# Patient Record
Sex: Male | Born: 1970
Health system: Southern US, Community
[De-identification: ages and names within clinical notes are randomized; demographics above are authoritative.]

## PROBLEM LIST (undated history)

## (undated) DIAGNOSIS — K5792 Diverticulitis of intestine, part unspecified, without perforation or abscess without bleeding: Secondary | ICD-10-CM

## (undated) DIAGNOSIS — G809 Cerebral palsy, unspecified: Secondary | ICD-10-CM

## (undated) DIAGNOSIS — K5790 Diverticulosis of intestine, part unspecified, without perforation or abscess without bleeding: Secondary | ICD-10-CM

## (undated) DIAGNOSIS — R6250 Unspecified lack of expected normal physiological development in childhood: Secondary | ICD-10-CM

## (undated) DIAGNOSIS — G40209 Localization-related (focal) (partial) symptomatic epilepsy and epileptic syndromes with complex partial seizures, not intractable, without status epilepticus: Secondary | ICD-10-CM

## (undated) DIAGNOSIS — K651 Peritoneal abscess: Secondary | ICD-10-CM

## (undated) DIAGNOSIS — K5780 Diverticulitis of intestine, part unspecified, with perforation and abscess without bleeding: Secondary | ICD-10-CM

## (undated) HISTORY — DX: Unspecified lack of expected normal physiological development in childhood: R62.50

## (undated) HISTORY — DX: Diverticulitis of intestine, part unspecified, with perforation and abscess without bleeding: K57.80

## (undated) HISTORY — DX: Localization-related (focal) (partial) symptomatic epilepsy and epileptic syndromes with complex partial seizures, not intractable, without status epilepticus: G40.209

## (undated) HISTORY — DX: Cerebral palsy, unspecified: G80.9

## (undated) HISTORY — DX: Peritoneal abscess: K65.1

## (undated) HISTORY — DX: Diverticulosis of intestine, part unspecified, without perforation or abscess without bleeding: K57.90

---
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
## 2006-07-10 ENCOUNTER — Ambulatory Visit: Payer: Self-pay | Admitting: Family Medicine

## 2007-06-27 ENCOUNTER — Ambulatory Visit: Payer: Self-pay | Admitting: Family Medicine

## 2010-04-12 ENCOUNTER — Encounter (INDEPENDENT_AMBULATORY_CARE_PROVIDER_SITE_OTHER): Payer: Self-pay | Admitting: *Deleted

## 2010-09-20 ENCOUNTER — Ambulatory Visit
Admission: RE | Admit: 2010-09-20 | Discharge: 2010-09-20 | Payer: Self-pay | Source: Home / Self Care | Attending: Family Medicine | Admitting: Family Medicine

## 2010-10-04 NOTE — Letter (Signed)
Summary: Nadara Eaton letter  [facility]  [address] [place], [place] [address]   Phone: [phone]  Fax: [phone]       04/12/2010 MRN: [id number]  NEMIAH KISSNER [address] [place], [place]  [address]  Dear Mr. TAGEN, [facility], and [facility] announce the retirement of Arta Silence, M.D., from full-time practice at the [facility] office effective March 03, 2010 and his plans of returning part-time.  It is important to Dr. Hetty Ely and to our practice that you understand that [facility] has seven physicians in our office for your health care needs.  We will continue to offer the same exceptional care that you have today.    Dr. Hetty Ely has spoken to many of you about his plans for retirement and returning part-time in the fall.   We will continue to work with you through the transition to schedule appointments for you in the office and meet the high standards that [facility] is committed to.   Again, it is with great pleasure that we share the news that Dr. Hetty Ely will return to [facility] at [facility] in October of 2011 with a reduced schedule.    If you have any questions, or would like to request an appointment with one of our physicians, please call us at [phone] and press the option for Scheduling an appointment.  We take pleasure in providing you with excellent patient care and look forward to seeing you at your next office visit.  Our [facility] Physicians are:  Tillman Abide, M.D. Laurita Quint, M.D. Roxy Manns, M.D. Kerby Nora, M.D. Hannah Beat, M.D. Ruthe Mannan, M.D. We proudly welcomed Raechel Ache, M.D. and Eustaquio Boyden, M.D. to the practice in July/August 2011.  Sincerely,  [facility]

## 2010-10-06 NOTE — Assessment & Plan Note (Signed)
Summary: CHECK MOLE UNDER RIGHT ARM/CLE   Vital Signs:  Patient profile:   40 year old male Height:      71 inches Weight:      218.25 pounds BMI:     30.55 Temp:     71 degrees F oral Pulse rate:   72 / minute Pulse rhythm:   regular BP sitting:   114 / 78  (left arm) Cuff size:   large  Vitals Entered By: Sydell Axon LPN (September 20, 2010 3:46 PM) CC: Check mole under right arm   History of Present Illness: Edward Adkins here for a mole under his right arm that gets irritataed easily and then bothers him. He also has one on his anterior neck, one on the lateral lower left neck and one on the left mid chestwall. He otherwise feels well and has no complaints. He hasn't been here since his mother died fairly quickly from cancer. He seems to be doing well. 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 His only allergy is to Phenobarbital.  Problems Prior to Update: None  Medications Prior to Update: 1)  None  Current Medications (verified): 1)  Tegretol 200 Mg Tabs (Carbamazepine) .... Take One By Mouth Three Times A Day  Allergies (verified): 1)  ! Phenobarbital (Phenobarbital)  Physical Exam  General:  Well-developed,well-nourished,in no acute distress; alert,appropriate and cooperative throughout examination Head:  Normocephalic and atraumatic without obvious abnormalities.  Skin:  Irritated skin tag in the right axillary area laterally... skin tag also of the ant neck, left lat lower neck and left mid chest/abd wall. All areas prepped with betadine, local with lido and epi used. Hyfrecator was used at each site to excise. Bactroban and sterile drsg placed over each site. Tolerated well.   Impression & Recommendations:  Problem # 1:  NEOPLASM, SKIN, UNCERTAIN BEHAVIOR (ICD-238.2) Felt to be irritated skin tag that all were excised. Keep clean and dry for 24 hrs. RTC for irritation or redness.  Complete Medication List: 1)  Tegretol 200 Mg Tabs  (Carbamazepine) .... Take one by mouth three times a day  Patient Instructions: 1)  Pls schedule a Comp Exam, labs prior.     Current Allergies (reviewed today): ! PHENOBARBITAL (PHENOBARBITAL)

## 2010-10-15 ENCOUNTER — Encounter: Payer: Self-pay | Admitting: Family Medicine

## 2010-12-01 ENCOUNTER — Other Ambulatory Visit: Payer: Self-pay | Admitting: Family Medicine

## 2010-12-01 ENCOUNTER — Other Ambulatory Visit (INDEPENDENT_AMBULATORY_CARE_PROVIDER_SITE_OTHER): Payer: PRIVATE HEALTH INSURANCE | Admitting: Family Medicine

## 2010-12-01 DIAGNOSIS — Z0000 Encounter for general adult medical examination without abnormal findings: Secondary | ICD-10-CM

## 2010-12-01 LAB — LIPID PANEL
LDL Cholesterol: 134 mg/dL — ABNORMAL HIGH (ref 0–99)
Total CHOL/HDL Ratio: 5
Triglycerides: 120 mg/dL (ref 0.0–149.0)
VLDL: 24 mg/dL (ref 0.0–40.0)

## 2010-12-01 LAB — BASIC METABOLIC PANEL
BUN: 17 mg/dL (ref 6–23)
CO2: 30 mEq/L (ref 19–32)
Chloride: 107 mEq/L (ref 96–112)
Creatinine, Ser: 0.8 mg/dL (ref 0.4–1.5)
Potassium: 4.3 mEq/L (ref 3.5–5.1)

## 2010-12-01 LAB — HEPATIC FUNCTION PANEL
Albumin: 4.2 g/dL (ref 3.5–5.2)
Alkaline Phosphatase: 57 U/L (ref 39–117)
Bilirubin, Direct: 0.1 mg/dL (ref 0.0–0.3)

## 2010-12-07 ENCOUNTER — Encounter: Payer: Self-pay | Admitting: Family Medicine

## 2010-12-07 ENCOUNTER — Ambulatory Visit (INDEPENDENT_AMBULATORY_CARE_PROVIDER_SITE_OTHER): Payer: PRIVATE HEALTH INSURANCE | Admitting: Family Medicine

## 2010-12-07 DIAGNOSIS — G40209 Localization-related (focal) (partial) symptomatic epilepsy and epileptic syndromes with complex partial seizures, not intractable, without status epilepticus: Secondary | ICD-10-CM

## 2010-12-07 DIAGNOSIS — E785 Hyperlipidemia, unspecified: Secondary | ICD-10-CM | POA: Insufficient documentation

## 2010-12-07 NOTE — Progress Notes (Addendum)
  Subjective:    Patient ID: Edward Adkins, male    DOB: Feb 21, 1971, 40 y.o.   MRN: [id number]  HPI Pt here for Comp Exam. He feels well with no complaints. He still works at [employer] and is well liked.    Review of Systems  Constitutional: Negative for fever, chills, diaphoresis, appetite change, fatigue and unexpected weight change.  HENT: Negative for hearing loss, ear pain, tinnitus and ear discharge.   Eyes: Negative for pain, discharge and visual disturbance.  Respiratory: Negative for cough, shortness of breath and wheezing.   Cardiovascular: Negative for chest pain and palpitations.       No SOB w/ exertion  Gastrointestinal: Negative for nausea, vomiting, abdominal pain, diarrhea, constipation and blood in stool.       No heartburn or swallowing problems.  Genitourinary: Negative for dysuria, frequency and difficulty urinating.       No nocturia  Musculoskeletal: Negative for myalgias, back pain and arthralgias.  Skin: Negative for rash.       No itching or dryness.  Neurological: Negative for tremors and numbness.       No tingling or balance problems.  Hematological: Negative for adenopathy. Does not bruise/bleed easily.  Psychiatric/Behavioral: Negative for dysphoric mood and agitation.       Objective:   Physical Exam         Assessment & Plan:  Labs reviewed and are nml except LDL is slightly elevated and avoiding fatty foods and exercise will help. Needs Tegretol lvl in the future. Will check just after dosing for peak lvl. Noted 4/12..the patient still sees Neurology for followup of his Seizure Disorder and ha his Tegretol Lvl done there. No need for Lab here.

## 2010-12-07 NOTE — Assessment & Plan Note (Addendum)
New finding. LDL slightly high and can probably be corrected by mild weight loss and avoiding fatty foods. Encouraged to do exercise that will make him breathe hard and sweat. Will check again next year. Lab Results  Component Value Date   CHOL 194 12/01/2010   Lab Results  Component Value Date   HDL 36.40* 12/01/2010   Lab Results  Component Value Date   LDLCALC 134* 12/01/2010   Lab Results  Component Value Date   TRIG 120.0 12/01/2010   Lab Results  Component Value Date   CHOLHDL 5 12/01/2010  LDL calc 161

## 2010-12-07 NOTE — Assessment & Plan Note (Signed)
No recent seizures. Will need Tegretol lvl done in the future.

## 2010-12-15 NOTE — Progress Notes (Signed)
  Subjective:    Patient ID: Edward Adkins, male    DOB: 22-May-1971, 40 y.o.   MRN: [id number]  HPI       Review of Systems     Objective:   Physical Exam        Assessment & Plan:   Pt still sees Neurology and his Tegretol Lvl is followed there as well.

## 2012-08-27 ENCOUNTER — Encounter: Payer: Self-pay | Admitting: Family Medicine

## 2013-08-06 ENCOUNTER — Ambulatory Visit (INDEPENDENT_AMBULATORY_CARE_PROVIDER_SITE_OTHER): Payer: Medicare Other | Admitting: Nurse Practitioner

## 2013-08-06 ENCOUNTER — Encounter: Payer: Self-pay | Admitting: Nurse Practitioner

## 2013-08-06 ENCOUNTER — Encounter (INDEPENDENT_AMBULATORY_CARE_PROVIDER_SITE_OTHER): Payer: Self-pay

## 2013-08-06 VITALS — BP 128/83 | HR 74 | Ht 73.5 in | Wt 225.0 lb

## 2013-08-06 DIAGNOSIS — G40309 Generalized idiopathic epilepsy and epileptic syndromes, not intractable, without status epilepticus: Secondary | ICD-10-CM | POA: Diagnosis not present

## 2013-08-06 DIAGNOSIS — Z79899 Other long term (current) drug therapy: Secondary | ICD-10-CM

## 2013-08-06 MED ORDER — CARBAMAZEPINE 200 MG PO TABS: 200.0000 mg | ORAL_TABLET | Freq: Three times a day (TID) | ORAL | Status: DC

## 2013-08-06 NOTE — Progress Notes (Signed)
[facility]  PATIENT: Edward Adkins DOB: [birth year]   REASON FOR VISIT: Seizure disorder  HISTORY OF PRESENT ILLNESS: Edward Adkins, 42 year old male returns for followup. He has a history of cerebral palsy, mild mental retardation and seizure disorder. He has been followed at this office since 1984 last seizure event 1991. He is currently well-controlled on carbamazepine 200  (3) times daily without side effects. He needs labs today. He has not fallen, no missed doses of medication. No new neurologic complaints. He is accompanied by his father.   HISTORY:  of cerebral palsy, mild mental retardation and seizure disorder. He has been followed at [facility] since 1984,  onset of seizure disorder at age 89. 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 He was originally on phenobarbital and developed a rash, switched to Dilantin  with recurrent seizures. Initiated on carbamazepine in 1991without further seizure activity since that time. Denies any side effects to his medications, any daytime drowsiness, feelings of being off balance, no missed doses of his medication, and no falls.    REVIEW OF SYSTEMS: Full 14 system review of systems performed and notable only for:  Constitutional: Weight gain  Cardiovascular: N/A  Ear/Nose/Throat: N/A  Skin: N/A  Eyes: N/A  Respiratory: N/A  Gastroitestinal: N/A  Hematology/Lymphatic: N/A  Endocrine: N/A Musculoskeletal:N/A  Allergy/Immunology: N/A  Neurological: N/A Psychiatric: N/A   ALLERGIES: Allergies  Allergen Reactions  . Phenobarbital     REACTION: rash    HOME MEDICATIONS: Outpatient Prescriptions Prior to Visit  Medication Sig Dispense Refill  . carbamazepine (TEGRETOL) 200 MG tablet Take one by mouth three times a day      . Multiple Vitamin (MULTIVITAMIN) tablet Take 1 tablet by mouth daily.         No  facility-administered medications prior to visit.    PAST MEDICAL HISTORY: Past Medical History  Diagnosis Date  . Complex partial seizure disorder 05/78    Head CT's multiple-WNL, EEG R temporal lobe discharge (GNA)  . Cerebral palsy   . Mild mental retardation     PAST SURGICAL HISTORY: History reviewed. No pertinent past surgical history.  FAMILY HISTORY: Family History  Problem Relation Age of Onset  . Cancer Mother 45    Leukemia  . Cancer Father 46    Prostate ca  . CAD Father   . Diabetes Father     SOCIAL HISTORY: History   Social History  . Marital Status: Single    Spouse Name: N/A    Number of Children: 0  . Years of Education: 12   Occupational History  .     Social History Main Topics  . Smoking status: Never Smoker   . Smokeless tobacco: Never Used  . Alcohol Use: No  . Drug Use: No  . Sexual Activity: Not on file   Other Topics Concern  . Not on file   Social History Narrative   Patient is single and lives with his father.   Patient drinks two caffeine drinks daily.   Patient is ambi-dextrous.   Patient has a high school education.   Patient is a Merchandiser, retail at [employer].              PHYSICAL EXAM  Filed Vitals:   08/06/13 1040  BP: 128/83  Pulse: 74  Height: 6' 1.5" (1.867 m)  Weight: 225 lb (102.059 kg)  Body mass index is 29.28 kg/(m^2).  Generalized: Well developed, in no acute distress  Head: normocephalic and atraumatic,. Oropharynx benign  Neurological examination   Mentation: Alert oriented to time, place, history taking. Follows all commands, mild speech impairment but understandable.  Cranial nerve II-XII: Pupils were equal round reactive to light. Left exotropia.  extraocular movements otherwise full, visual field were full on confrontational test. Facial sensation and strength were normal. hearing was intact to finger rubbing bilaterally. Uvula tongue midline. head turning and shoulder shrug and were normal and  symmetric.Tongue protrusion into cheek strength was normal. Motor: normal bulk and tone, full strength in the BUE, BLE, . No focal weakness Coordination: finger-nose-finger, heel-to-shin bilaterally, no dysmetria Gait and Station: Rising up from seated position without assistance, normal stance, moderate stride, good arm swing, smooth turning, able to perform tiptoe, and heel walking without difficulty. Tandem gait steady  DIAGNOSTIC DATA (LABS, IMAGING, TESTING) - None to review   ASSESSMENT AND PLAN  42 y.o. year old male  has a past medical history of Complex partial seizure disorder, generalized seizure disorder; Cerebral palsy; and Mild mental retardation. here for followup. No seizures since 1991. Currently on carbamazepine 200 (3) times daily without side effects  Will continue Carbamazepine 200mg  TID will refill for 1 year Will check labs today, CBC, CMP and CBZ level today F/U yearly and prn  Nilda Riggs, [facility], [facility], APRN  [facility] [address] [place], [place] [address] [phone]

## 2013-08-06 NOTE — Patient Instructions (Signed)
Will continue Carbamazepine 200mg  TID will refill for 1 year Will check labs today, CBC, CMP and CBZ level today F/U yearly and prn

## 2013-08-07 LAB — CBC WITH DIFFERENTIAL/PLATELET
Basos: 0 %
Eos: 3 %
Eosinophils Absolute: 0.2 10*3/uL (ref 0.0–0.4)
Hemoglobin: 15.5 g/dL (ref 12.6–17.7)
Lymphs: 34 %
MCH: 30.8 pg (ref 26.6–33.0)
Monocytes: 9 %
Neutrophils Absolute: 3.6 10*3/uL (ref 1.4–7.0)
Neutrophils Relative %: 54 %
RBC: 5.03 x10E6/uL (ref 4.14–5.80)
WBC: 6.7 10*3/uL (ref 3.4–10.8)

## 2013-08-07 LAB — COMPREHENSIVE METABOLIC PANEL
ALT: 23 IU/L (ref 0–44)
AST: 20 IU/L (ref 0–40)
CO2: 24 mmol/L (ref 18–29)
Calcium: 9.6 mg/dL (ref 8.7–10.2)
Glucose: 78 mg/dL (ref 65–99)
Potassium: 4.4 mmol/L (ref 3.5–5.2)
Sodium: 137 mmol/L (ref 134–144)
Total Protein: 7.9 g/dL (ref 6.0–8.5)

## 2013-08-07 LAB — CARBAMAZEPINE LEVEL, TOTAL: Carbamazepine Lvl: 6.9 ug/mL (ref 4.0–12.0)

## 2013-08-07 NOTE — Progress Notes (Signed)
Quick Note:  Shared lab results per Ms Martin's findings with patient, he verbalized understanding ______

## 2014-02-26 DIAGNOSIS — Z0289 Encounter for other administrative examinations: Secondary | ICD-10-CM

## 2014-07-23 ENCOUNTER — Other Ambulatory Visit: Payer: Self-pay | Admitting: Nurse Practitioner

## 2014-08-03 DIAGNOSIS — Z23 Encounter for immunization: Secondary | ICD-10-CM | POA: Diagnosis not present

## 2014-08-06 ENCOUNTER — Encounter: Payer: Self-pay | Admitting: Nurse Practitioner

## 2014-08-06 ENCOUNTER — Ambulatory Visit (INDEPENDENT_AMBULATORY_CARE_PROVIDER_SITE_OTHER): Payer: Medicare Other | Admitting: Nurse Practitioner

## 2014-08-06 VITALS — BP 109/69 | HR 69 | Temp 98.6°F | Ht 73.0 in | Wt 224.0 lb

## 2014-08-06 DIAGNOSIS — Z5181 Encounter for therapeutic drug level monitoring: Secondary | ICD-10-CM | POA: Diagnosis not present

## 2014-08-06 DIAGNOSIS — G40309 Generalized idiopathic epilepsy and epileptic syndromes, not intractable, without status epilepticus: Secondary | ICD-10-CM

## 2014-08-06 DIAGNOSIS — G40209 Localization-related (focal) (partial) symptomatic epilepsy and epileptic syndromes with complex partial seizures, not intractable, without status epilepticus: Secondary | ICD-10-CM | POA: Diagnosis not present

## 2014-08-06 MED ORDER — CARBAMAZEPINE 200 MG PO TABS: 200.0000 mg | ORAL_TABLET | Freq: Three times a day (TID) | ORAL | Status: DC

## 2014-08-06 NOTE — Progress Notes (Signed)
[facility]  PATIENT: Edward Adkins DOB: 11/02/1970   REASON FOR VISIT: Follow-up for seizure disorder   HISTORY OF PRESENT ILLNESS:Mr. Edward Adkins, 43 year old male returns for followup. He has a history of cerebral palsy, mild mental retardation and seizure disorder. He has been followed at this office since 1984 last seizure event 1991. He is currently well-controlled on carbamazepine 200 (3) times daily without side effects. He needs labs today. He has not fallen, no missed doses of medication. No new neurologic complaints. He is unaccompanied. He returns for reevaluation. He needs medication refills   HISTORY: of cerebral palsy, mild mental retardation and seizure disorder. He has been followed at [facility] since 1984, onset of seizure disorder at age 43. 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 He was originally on phenobarbital and developed a rash, switched to Dilantin with recurrent seizures. Initiated on carbamazepine in 1991without further seizure activity since that time. Denies any side effects to his medications, any daytime drowsiness, feelings of being off balance, no missed doses of his medication, and no falls.   REVIEW OF SYSTEMS: Full 14 system review of systems performed and notable only for those listed, all others are neg:  Constitutional: N/A  Cardiovascular: N/A  Ear/Nose/Throat: N/A  Skin: N/A  Eyes: N/A  Respiratory: N/A  Gastroitestinal: N/A  Hematology/Lymphatic: N/A  Endocrine: N/A Musculoskeletal:N/A  Allergy/Immunology: N/A  Neurological: N/A Psychiatric: N/A Sleep : NA   ALLERGIES: Allergies  Allergen Reactions  . Phenobarbital     REACTION: rash    HOME MEDICATIONS: Outpatient Prescriptions Prior to Visit  Medication Sig Dispense Refill  . carbamazepine (TEGRETOL) 200 MG tablet take 1 tablet by mouth three times a day 90  tablet 0  . Multiple Vitamin (MULTIVITAMIN) tablet Take 1 tablet by mouth daily.       No facility-administered medications prior to visit.    PAST MEDICAL HISTORY: Past Medical History  Diagnosis Date  . Complex partial seizure disorder 05/78    Head CT's multiple-WNL, EEG R temporal lobe discharge (GNA)  . Cerebral palsy   . Mild mental retardation     PAST SURGICAL HISTORY: History reviewed. No pertinent past surgical history.  FAMILY HISTORY: Family History  Problem Relation Age of Onset  . Cancer Mother 14    Leukemia  . Cancer Father 67    Prostate ca  . CAD Father   . Diabetes Father     SOCIAL HISTORY: History   Social History  . Marital Status: Single    Spouse Name: N/A    Number of Children: 0  . Years of Education: 12   Occupational History  .     Social History Main Topics  . Smoking status: Never Smoker   . Smokeless tobacco: Never Used  . Alcohol Use: No  . Drug Use: No  . Sexual Activity: Not on file   Other Topics Concern  . Not on file   Social History Narrative   Patient is single and lives with his father.   Patient drinks two caffeine drinks daily.   Patient is ambi-dextrous.   Patient has a high school education.   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              PHYSICAL EXAM  Filed Vitals:   08/06/14 1332  BP: 109/69  Pulse: 69  Temp: 98.6 F (37 C)  TempSrc:  Oral  Height: 6\' 1"  (1.854 m)  Weight: 224 lb (101.606 kg)   Body mass index is 29.56 kg/(m^2). Generalized: Well developed, in no acute distress  Head: normocephalic and atraumatic,. Oropharynx benign  Neurological examination   Mentation: Alert oriented to time, place, history taking. Follows all commands, mild speech impairment but understandable.  Cranial nerve II-XII: Pupils were equal round reactive to light. Left exotropia. extraocular movements otherwise full, visual field were full on confrontational test. Facial sensation and strength were  normal. hearing was intact to finger rubbing bilaterally. Uvula tongue midline. head turning and shoulder shrug and were normal and symmetric.Tongue protrusion into cheek strength was normal. Motor: normal bulk and tone, full strength in the BUE, BLE, . No focal weakness Coordination: finger-nose-finger, heel-to-shin bilaterally, no dysmetria Gait and Station: Rising up from seated position without assistance, normal stance, moderate stride, good arm swing, smooth turning, able to perform tiptoe, and heel walking without difficulty. Tandem gait steady  (DIAGNOSTIC TESTING) - I reviewed patient records, labs, notes, testing and imaging  Lab Results  Component Value Date   WBC 6.7 08/06/2013   HGB 15.5 08/06/2013   HCT 45.0 08/06/2013   MCV 90 08/06/2013      Component Value Date/Time   NA 137 08/06/2013 1121   NA 141 12/01/2010 0839   K 4.4 08/06/2013 1121   CL 102 08/06/2013 1121   CO2 24 08/06/2013 1121   GLUCOSE 78 08/06/2013 1121   GLUCOSE 85 12/01/2010 0839   BUN 16 08/06/2013 1121   BUN 17 12/01/2010 0839   CREATININE 0.77 08/06/2013 1121   CALCIUM 9.6 08/06/2013 1121   PROT 7.9 08/06/2013 1121   PROT 7.5 12/01/2010 0839   ALBUMIN 4.2 12/01/2010 0839   AST 20 08/06/2013 1121   ALT 23 08/06/2013 1121   ALKPHOS 83 08/06/2013 1121   BILITOT 0.3 08/06/2013 1121   GFRNONAA 114 08/06/2013 1121   GFRAA 131 08/06/2013 1121     ASSESSMENT AND PLAN  43 y.o. year old male  has a past medical history of Complex partial seizure disorder (05/78); Cerebral palsy; and Mild mental retardation. here to follow-up. Last seizure 1991 well controlled on carbamazepine without side effects.   Continue carbamazepine at current dose refilled for the next year Check labs today, CBC, CMP Follow-up yearly and when necessary,  Dennie Bible, [facility], [facility], [facility] [address] [place], [place] [address] [phone]

## 2014-08-06 NOTE — Patient Instructions (Signed)
Continue carbamazepine at current dose refilled for the next year Check labs today Follow-up yearly and when necessary

## 2014-08-07 LAB — COMPREHENSIVE METABOLIC PANEL
ALK PHOS: 69 IU/L (ref 39–117)
ALT: 19 IU/L (ref 0–44)
AST: 21 IU/L (ref 0–40)
Albumin/Globulin Ratio: 1.5 (ref 1.1–2.5)
Albumin: 4.7 g/dL (ref 3.5–5.5)
BILIRUBIN TOTAL: 0.2 mg/dL (ref 0.0–1.2)
BUN / CREAT RATIO: 14 (ref 9–20)
BUN: 11 mg/dL (ref 6–24)
CHLORIDE: 101 mmol/L (ref 97–108)
CO2: 23 mmol/L (ref 18–29)
Calcium: 9.5 mg/dL (ref 8.7–10.2)
Creatinine, Ser: 0.8 mg/dL (ref 0.76–1.27)
GFR calc non Af Amer: 111 mL/min/{1.73_m2} (ref >59)
GFR, EST AFRICAN AMERICAN: 128 mL/min/{1.73_m2} (ref >59)
GLOBULIN, TOTAL: 3.2 g/dL (ref 1.5–4.5)
Glucose: 88 mg/dL (ref 65–99)
Potassium: 4.6 mmol/L (ref 3.5–5.2)
Sodium: 143 mmol/L (ref 134–144)
Total Protein: 7.9 g/dL (ref 6.0–8.5)

## 2014-08-07 LAB — CBC WITH DIFFERENTIAL/PLATELET
BASOS ABS: 0.1 10*3/uL (ref 0.0–0.2)
Basos: 1 %
Eos: 4 %
Eosinophils Absolute: 0.3 10*3/uL (ref 0.0–0.4)
HCT: 43.6 % (ref 37.5–51.0)
Hemoglobin: 14.9 g/dL (ref 12.6–17.7)
IMMATURE GRANULOCYTES: 0 %
Immature Grans (Abs): 0 10*3/uL (ref 0.0–0.1)
LYMPHS ABS: 2.9 10*3/uL (ref 0.7–3.1)
Lymphs: 35 %
MCH: 30.8 pg (ref 26.6–33.0)
MCHC: 34.2 g/dL (ref 31.5–35.7)
MCV: 90 fL (ref 79–97)
MONOS ABS: 0.6 10*3/uL (ref 0.1–0.9)
Monocytes: 7 %
Neutrophils Absolute: 4.3 10*3/uL (ref 1.4–7.0)
Neutrophils Relative %: 53 %
RBC: 4.83 x10E6/uL (ref 4.14–5.80)
RDW: 13.2 % (ref 12.3–15.4)
WBC: 8.2 10*3/uL (ref 3.4–10.8)

## 2014-08-10 NOTE — Progress Notes (Signed)
I agree above plan. 

## 2014-08-23 ENCOUNTER — Other Ambulatory Visit: Payer: Self-pay | Admitting: Diagnostic Neuroimaging

## 2014-09-04 HISTORY — PX: COLONOSCOPY: SHX174

## 2014-10-05 DIAGNOSIS — K5790 Diverticulosis of intestine, part unspecified, without perforation or abscess without bleeding: Secondary | ICD-10-CM | POA: Insufficient documentation

## 2014-10-05 DIAGNOSIS — K5792 Diverticulitis of intestine, part unspecified, without perforation or abscess without bleeding: Secondary | ICD-10-CM

## 2014-10-05 HISTORY — DX: Diverticulosis of intestine, part unspecified, without perforation or abscess without bleeding: K57.90

## 2014-10-05 HISTORY — DX: Diverticulitis of intestine, part unspecified, without perforation or abscess without bleeding: K57.92

## 2014-10-12 ENCOUNTER — Ambulatory Visit (INDEPENDENT_AMBULATORY_CARE_PROVIDER_SITE_OTHER): Payer: Medicare Other | Admitting: Family Medicine

## 2014-10-12 ENCOUNTER — Other Ambulatory Visit: Payer: Self-pay | Admitting: Family Medicine

## 2014-10-12 ENCOUNTER — Encounter: Payer: Self-pay | Admitting: Family Medicine

## 2014-10-12 VITALS — BP 120/84 | HR 112 | Temp 99.6°F | Wt 222.0 lb

## 2014-10-12 DIAGNOSIS — R1030 Lower abdominal pain, unspecified: Secondary | ICD-10-CM

## 2014-10-12 DIAGNOSIS — G40209 Localization-related (focal) (partial) symptomatic epilepsy and epileptic syndromes with complex partial seizures, not intractable, without status epilepticus: Secondary | ICD-10-CM | POA: Diagnosis not present

## 2014-10-12 LAB — COMPREHENSIVE METABOLIC PANEL
ALT: 17 U/L (ref 0–53)
AST: 12 U/L (ref 0–37)
Albumin: 4.4 g/dL (ref 3.5–5.2)
Alkaline Phosphatase: 56 U/L (ref 39–117)
BUN: 17 mg/dL (ref 6–23)
CO2: 25 meq/L (ref 19–32)
CREATININE: 0.99 mg/dL (ref 0.40–1.50)
Calcium: 9.4 mg/dL (ref 8.4–10.5)
Chloride: 98 mEq/L (ref 96–112)
GFR: 88.12 mL/min (ref >60.00)
GLUCOSE: 97 mg/dL (ref 70–99)
Potassium: 3.9 mEq/L (ref 3.5–5.1)
SODIUM: 134 meq/L — AB (ref 135–145)
TOTAL PROTEIN: 8.7 g/dL — AB (ref 6.0–8.3)
Total Bilirubin: 0.8 mg/dL (ref 0.2–1.2)

## 2014-10-12 LAB — POCT URINALYSIS DIPSTICK
Blood, UA: NEGATIVE
Glucose, UA: NEGATIVE
Leukocytes, UA: NEGATIVE
Nitrite, UA: NEGATIVE
PH UA: 6
UROBILINOGEN UA: 0.2

## 2014-10-12 LAB — CBC WITH DIFFERENTIAL/PLATELET
BASOS PCT: 0.6 % (ref 0.0–3.0)
Basophils Absolute: 0.1 10*3/uL (ref 0.0–0.1)
Eosinophils Absolute: 0 10*3/uL (ref 0.0–0.7)
Eosinophils Relative: 0.1 % (ref 0.0–5.0)
HCT: 45.3 % (ref 39.0–52.0)
HEMOGLOBIN: 15.4 g/dL (ref 13.0–17.0)
LYMPHS ABS: 1.5 10*3/uL (ref 0.7–4.0)
Lymphocytes Relative: 8.5 % — ABNORMAL LOW (ref 12.0–46.0)
MCHC: 33.9 g/dL (ref 30.0–36.0)
MCV: 90.3 fl (ref 78.0–100.0)
MONO ABS: 1.2 10*3/uL — AB (ref 0.1–1.0)
MONOS PCT: 7 % (ref 3.0–12.0)
NEUTROS ABS: 14.8 10*3/uL — AB (ref 1.4–7.7)
Neutrophils Relative %: 83.8 % — ABNORMAL HIGH (ref 43.0–77.0)
PLATELETS: 213 10*3/uL (ref 150.0–400.0)
RBC: 5.01 Mil/uL (ref 4.22–5.81)
RDW: 12.9 % (ref 11.5–15.5)
WBC: 17.7 10*3/uL — AB (ref 4.0–10.5)

## 2014-10-12 LAB — LIPASE: Lipase: 5 U/L — ABNORMAL LOW (ref 11.0–59.0)

## 2014-10-12 MED ORDER — CIPROFLOXACIN HCL 500 MG PO TABS: 500.0000 mg | ORAL_TABLET | Freq: Two times a day (BID) | ORAL | Status: DC

## 2014-10-12 NOTE — Progress Notes (Addendum)
BP 120/84 mmHg  Pulse 112  Temp(Src) 99.6 F (37.6 C) (Oral)  Wt 222 lb (100.699 kg)   CC: abd pain  Subjective:    Patient ID: Edward Adkins, male    DOB: June 20, 1971, 44 y.o.   MRN: [id number]  HPI: Edward Adkins is a 44 y.o. male presenting on 10/12/2014 for Abdominal Pain   Presents with dad.   Not seen since 12/2010, at that time saw Dr Council Mechanic for CPE. He has known hyperlipidemia, mild mental retardation with cerebral palsy, and complex partial seizures well controlled on tegretol daily. He is single and lives with his father and worse at [employer].  He presents today with 3d h/o sharp stabbing pain lower abdomen. Last painful this morning. Appetite down. + nausea, endorses dysuria. + feverish and sweaty. At its worst, 10/10 pain, currently 0/10 pain. Has tried tylenol and maalox which was helpful. Also took father's tramadol which was also helpful. Last stool was this morning, normal consistency.  Has not checked for fever at home (lost thermometer). No diarrhea or constipation. No hematuria or blood in stool. No urgency of frequency. No indigestion or bloating. Bottom doesn't hurt. No new foods or restaurants, no recent travel.   Ginger ale and grape juice seems to alleviate pain. No aggravating factors.   This is similar to prior episode of abd pain that happened 2 months ago and resolved on its own.   Relevant past medical, surgical, family and social history reviewed and updated as indicated. Interim medical history since our last visit reviewed. Allergies and medications reviewed and updated. Current Outpatient Prescriptions on File Prior to Visit  Medication Sig  . carbamazepine (TEGRETOL) 200 MG tablet Take 1 tablet (200 mg total) by mouth 3 (three) times daily.  . Multiple Vitamin (MULTIVITAMIN) tablet Take 1 tablet by mouth daily.     No current facility-administered medications on file prior to visit.   Past Medical History  Diagnosis Date  . Complex partial  seizure disorder 05/78    Head CT's multiple-WNL, EEG R temporal lobe discharge (GNA)  . Cerebral palsy   . Mild mental retardation    History reviewed. No pertinent past surgical history.   Review of Systems Per HPI unless specifically indicated above     Objective:    BP 120/84 mmHg  Pulse 112  Temp(Src) 99.6 F (37.6 C) (Oral)  Wt 222 lb (100.699 kg)  Wt Readings from Last 3 Encounters:  10/12/14 222 lb (100.699 kg)  08/06/14 224 lb (101.606 kg)  08/06/13 225 lb (102.059 kg)    Physical Exam  Constitutional: He appears well-developed and well-nourished. No distress.  HENT:  Mouth/Throat: Oropharynx is clear and moist. No oropharyngeal exudate.  Eyes: Conjunctivae and EOM are normal. Pupils are equal, round, and reactive to light.  Cardiovascular: Regular rhythm, normal heart sounds and intact distal pulses.  Tachycardia present.   No murmur heard. Pulmonary/Chest: Effort normal and breath sounds normal. No respiratory distress. He has no wheezes. He has no rales.  Abdominal: Soft. Normal appearance and bowel sounds are normal. He exhibits no distension and no mass. There is no hepatosplenomegaly. There is tenderness in the periumbilical area and suprapubic area. There is no rigidity, no rebound, no guarding, no CVA tenderness and negative Murphy's sign. No hernia.  Musculoskeletal: He exhibits no edema.  Skin: Skin is warm and dry. No rash noted.  Psychiatric: He has a normal mood and affect.  Nursing note and vitals reviewed.  Results for  orders placed or performed in visit on 10/12/14  POCT Urinalysis Dipstick  Result Value Ref Range   Color, UA Amber    Clarity, UA Clear    Glucose, UA Negative    Bilirubin, UA 1+    Ketones, UA Trace    Spec Grav, UA >=1.030    Blood, UA Negative    pH, UA 6.0    Protein, UA 1+    Urobilinogen, UA 0.2    Nitrite, UA Negative    Leukocytes, UA Negative       Assessment & Plan:   Problem List Items Addressed This Visit     Lower abdominal pain - Primary    Tachycardic today but nontoxic, denies current abd pain. Unclear etiology - UA not consistent with infection or kidney stone. Not consistent with diverticulitis (no BM changes), appendicitis (lower abd pain). ?prostatitis.  Will check labs today (CMP, CBC, lipase) as well as update carbamazepine level as not checked this past year.  Discussed if worsening pain recommend ER evaluation. If not improving consider abdominal imaging for further evaluation.      Relevant Orders   POCT Urinalysis Dipstick (Completed)   Comprehensive metabolic panel   CBC with Differential/Platelet   Lipase   Complex partial seizures   Relevant Orders   Carbamazepine level, total       Follow up plan: Return in about 3 months (around 01/10/2015), or if symptoms worsen or fail to improve.

## 2014-10-12 NOTE — Assessment & Plan Note (Addendum)
Tachycardic today but nontoxic, denies current abd pain. Unclear etiology - UA not consistent with infection or kidney stone. Not consistent with diverticulitis (no BM changes), appendicitis (lower abd pain). ?prostatitis.  Will check labs today (CMP, CBC, lipase) as well as update carbamazepine level as not checked this past year.  Discussed if worsening pain recommend ER evaluation. If not improving consider abdominal imaging for further evaluation.

## 2014-10-12 NOTE — Patient Instructions (Addendum)
Urine looked concentrated - try to increase water intake or small sips of fluids throughout the day.  Blood work today - we will call you with results.  Let us know if worsening pain in the meantime.

## 2014-10-12 NOTE — Progress Notes (Signed)
Pre visit review using our clinic review tool, if applicable. No additional management support is needed unless otherwise documented below in the visit note. 

## 2014-10-13 ENCOUNTER — Encounter (HOSPITAL_COMMUNITY): Payer: Self-pay | Admitting: *Deleted

## 2014-10-13 ENCOUNTER — Ambulatory Visit (INDEPENDENT_AMBULATORY_CARE_PROVIDER_SITE_OTHER)
Admission: RE | Admit: 2014-10-13 | Discharge: 2014-10-13 | Disposition: A | Discharge status: Home / Self Care | Payer: Medicare Other | Source: Ambulatory Visit | Attending: Family Medicine | Admitting: Family Medicine

## 2014-10-13 ENCOUNTER — Inpatient Hospital Stay (HOSPITAL_COMMUNITY)
Admission: EM | Admit: 2014-10-13 | Discharge: 2014-10-22 | DRG: 392 | Disposition: A | Discharge status: Home / Self Care | Payer: Medicare Other | Attending: Internal Medicine | Admitting: Internal Medicine

## 2014-10-13 DIAGNOSIS — G809 Cerebral palsy, unspecified: Secondary | ICD-10-CM | POA: Diagnosis present

## 2014-10-13 DIAGNOSIS — K631 Perforation of intestine (nontraumatic): Secondary | ICD-10-CM | POA: Diagnosis not present

## 2014-10-13 DIAGNOSIS — G40209 Localization-related (focal) (partial) symptomatic epilepsy and epileptic syndromes with complex partial seizures, not intractable, without status epilepticus: Secondary | ICD-10-CM | POA: Diagnosis not present

## 2014-10-13 DIAGNOSIS — F70 Mild intellectual disabilities: Secondary | ICD-10-CM | POA: Diagnosis not present

## 2014-10-13 DIAGNOSIS — K5780 Diverticulitis of intestine, part unspecified, with perforation and abscess without bleeding: Secondary | ICD-10-CM | POA: Diagnosis not present

## 2014-10-13 DIAGNOSIS — L0291 Cutaneous abscess, unspecified: Secondary | ICD-10-CM

## 2014-10-13 DIAGNOSIS — Z888 Allergy status to other drugs, medicaments and biological substances status: Secondary | ICD-10-CM

## 2014-10-13 DIAGNOSIS — K5732 Diverticulitis of large intestine without perforation or abscess without bleeding: Secondary | ICD-10-CM

## 2014-10-13 DIAGNOSIS — R109 Unspecified abdominal pain: Secondary | ICD-10-CM | POA: Diagnosis not present

## 2014-10-13 DIAGNOSIS — K630 Abscess of intestine: Secondary | ICD-10-CM | POA: Diagnosis not present

## 2014-10-13 DIAGNOSIS — E785 Hyperlipidemia, unspecified: Secondary | ICD-10-CM | POA: Diagnosis not present

## 2014-10-13 DIAGNOSIS — K651 Peritoneal abscess: Secondary | ICD-10-CM

## 2014-10-13 DIAGNOSIS — K5720 Diverticulitis of large intestine with perforation and abscess without bleeding: Secondary | ICD-10-CM | POA: Diagnosis not present

## 2014-10-13 DIAGNOSIS — R1030 Lower abdominal pain, unspecified: Secondary | ICD-10-CM | POA: Diagnosis not present

## 2014-10-13 HISTORY — DX: Diverticulitis of intestine, part unspecified, without perforation or abscess without bleeding: K57.92

## 2014-10-13 LAB — URINALYSIS, ROUTINE W REFLEX MICROSCOPIC
Glucose, UA: NEGATIVE mg/dL
Ketones, ur: NEGATIVE mg/dL
LEUKOCYTES UA: NEGATIVE
Nitrite: NEGATIVE
PH: 5 (ref 5.0–8.0)
Protein, ur: 100 mg/dL — AB
Specific Gravity, Urine: 1.015 (ref 1.005–1.030)
Urobilinogen, UA: 0.2 mg/dL (ref 0.0–1.0)

## 2014-10-13 LAB — CBC WITH DIFFERENTIAL/PLATELET
BASOS PCT: 0 % (ref 0–1)
Basophils Absolute: 0 10*3/uL (ref 0.0–0.1)
EOS ABS: 0.1 10*3/uL (ref 0.0–0.7)
Eosinophils Relative: 1 % (ref 0–5)
HCT: 41 % (ref 39.0–52.0)
Hemoglobin: 14.1 g/dL (ref 13.0–17.0)
LYMPHS ABS: 1.7 10*3/uL (ref 0.7–4.0)
Lymphocytes Relative: 15 % (ref 12–46)
MCH: 30.7 pg (ref 26.0–34.0)
MCHC: 34.4 g/dL (ref 30.0–36.0)
MCV: 89.1 fL (ref 78.0–100.0)
MONO ABS: 0.9 10*3/uL (ref 0.1–1.0)
MONOS PCT: 8 % (ref 3–12)
Neutro Abs: 8.3 10*3/uL — ABNORMAL HIGH (ref 1.7–7.7)
Neutrophils Relative %: 76 % (ref 43–77)
Platelets: 224 10*3/uL (ref 150–400)
RBC: 4.6 MIL/uL (ref 4.22–5.81)
RDW: 12.7 % (ref 11.5–15.5)
WBC: 10.9 10*3/uL — ABNORMAL HIGH (ref 4.0–10.5)

## 2014-10-13 LAB — COMPREHENSIVE METABOLIC PANEL
ALBUMIN: 3.7 g/dL (ref 3.5–5.2)
ALT: 28 U/L (ref 0–53)
AST: 33 U/L (ref 0–37)
Alkaline Phosphatase: 61 U/L (ref 39–117)
Anion gap: 11 (ref 5–15)
BUN: 18 mg/dL (ref 6–23)
CALCIUM: 8.6 mg/dL (ref 8.4–10.5)
CHLORIDE: 99 mmol/L (ref 96–112)
CO2: 24 mmol/L (ref 19–32)
CREATININE: 1.04 mg/dL (ref 0.50–1.35)
GFR calc Af Amer: >90 mL/min (ref >90)
GFR calc non Af Amer: 88 mL/min — ABNORMAL LOW (ref >90)
Glucose, Bld: 112 mg/dL — ABNORMAL HIGH (ref 70–99)
Potassium: 4 mmol/L (ref 3.5–5.1)
SODIUM: 134 mmol/L — AB (ref 135–145)
Total Bilirubin: 0.8 mg/dL (ref 0.3–1.2)
Total Protein: 8.2 g/dL (ref 6.0–8.3)

## 2014-10-13 LAB — CARBAMAZEPINE LEVEL, TOTAL: CARBAMAZEPINE LVL: 8.6 ug/mL (ref 4.0–12.0)

## 2014-10-13 LAB — URINE MICROSCOPIC-ADD ON

## 2014-10-13 LAB — LIPASE, BLOOD: LIPASE: 24 U/L (ref 11–59)

## 2014-10-13 MED ORDER — SODIUM CHLORIDE 0.9 % IV SOLN
Freq: Once | INTRAVENOUS | Status: AC
  Administered 2014-10-13: 125 mL/h via INTRAVENOUS

## 2014-10-13 MED ORDER — IOHEXOL 300 MG/ML  SOLN
100.0000 mL | Freq: Once | INTRAMUSCULAR | Status: AC | PRN
  Administered 2014-10-13: 100 mL via INTRAVENOUS

## 2014-10-13 MED ORDER — PIPERACILLIN-TAZOBACTAM 3.375 G IVPB 30 MIN
3.3750 g | Freq: Once | INTRAVENOUS | Status: AC
  Administered 2014-10-13: 3.375 g via INTRAVENOUS
  Filled 2014-10-13: qty 50

## 2014-10-13 MED ORDER — SODIUM CHLORIDE 0.9 % IV BOLUS (SEPSIS)
500.0000 mL | Freq: Once | INTRAVENOUS | Status: AC
  Administered 2014-10-13: 500 mL via INTRAVENOUS

## 2014-10-13 NOTE — H&P (Addendum)
[facility] History and Physical  Patient: Edward Adkins  MRN: [id number]  DOB: May 30, 1971  DOS: the patient was seen and examined on 10/13/2014 PCP: [name], MD  Chief Complaint: Abdominal pain  HPI: Edward Adkins is a 44 y.o. male with Past medical history of cerebral palsy with complex partial seizure. The patient is presenting with complaints of abdominal pain the pain is located in the lower abdomen feels like a sharp crampy pain. The pain has been ongoing over the weekend for last 3 days. They went to see his PCP on Monday and there he was started on ciprofloxacin and a CT scan was also ordered. Since CT scan was abnormal the patient was sent here for further workup. Patient's father mentions that patient had a similar episode 2 months ago at which time the symptoms resolved on its own. Patient does not have any nausea or vomiting does not have any diarrhea does not have any active bleeding does not have any fever. No recent changes in patient's medications reported. No further prior episodes reported. No abdominal surgery reported. Surgery was initially consulted who recommended conservative management and at medicine admission.  The patient is coming from home. And at his baseline independent for most of his ADL.  Review of Systems: as mentioned in the history of present illness.  A Comprehensive review of the other systems is negative.  Past Medical History  Diagnosis Date  . Complex partial seizure disorder 05/78    Head CT's multiple-WNL, EEG R temporal lobe discharge (GNA)  . Cerebral palsy   . Mild mental retardation    History reviewed. No pertinent past surgical history. Social History:  reports that he has never smoked. He has never used smokeless tobacco. He reports that he does not drink alcohol or use illicit drugs.  Allergies  Allergen Reactions  . Phenobarbital     REACTION: rash    Family History  Problem Relation Age of Onset  . Cancer  Mother 37    Leukemia  . Cancer Father 73    Prostate ca  . CAD Father   . Diabetes Father     Prior to Admission medications   Medication Sig Start Date End Date Taking? Authorizing Provider  carbamazepine (TEGRETOL) 200 MG tablet Take 1 tablet (200 mg total) by mouth 3 (three) times daily. 08/06/14  Yes Dennie Bible, NP  ciprofloxacin (CIPRO) 500 MG tablet Take 1 tablet (500 mg total) by mouth 2 (two) times daily. 10/12/14  Yes [name], MD  Multiple Vitamin (MULTIVITAMIN) tablet Take 2 tablets by mouth daily.    Yes Historical Provider, MD    Physical Exam: Filed Vitals:   10/13/14 2230 10/13/14 2245 10/13/14 2300 10/13/14 2315  BP: 109/71 111/79 110/67 112/71  Pulse: 98 96 96 86  Temp:      TempSrc:      Resp: 18 16 16 16   Height:      Weight:      SpO2: 100% 100% 100% 100%    General: Alert, Awake and Oriented to Time, Place and Person. Appear in mild distress Eyes: PERRL ENT: Oral Mucosa clear moist. Neck: no JVD Cardiovascular: S1 and S2 Present, no Murmur, Peripheral Pulses Present Respiratory: Bilateral Air entry equal and Decreased, Clear to Auscultation, noCrackles, no wheezes Abdomen: Bowel Sound present, Soft and minimally tender lower abdomen Skin: no Rash Extremities: no Pedal edema, no calf tenderness Neurologic: Grossly no focal neuro deficit.  Labs on Admission:  CBC:  Recent  Labs Lab 10/12/14 1200 10/13/14 1917  WBC 17.7* 10.9*  NEUTROABS 14.8* 8.3*  HGB 15.4 14.1  HCT 45.3 41.0  MCV 90.3 89.1  PLT 213.0 224    CMP     Component Value Date/Time   NA 134* 10/13/2014 1917   NA 143 08/06/2014 1426   K 4.0 10/13/2014 1917   CL 99 10/13/2014 1917   CO2 24 10/13/2014 1917   GLUCOSE 112* 10/13/2014 1917   GLUCOSE 88 08/06/2014 1426   BUN 18 10/13/2014 1917   BUN 11 08/06/2014 1426   CREATININE 1.04 10/13/2014 1917   CALCIUM 8.6 10/13/2014 1917   PROT 8.2 10/13/2014 1917   PROT 7.9 08/06/2014 1426   ALBUMIN 3.7 10/13/2014  1917   AST 33 10/13/2014 1917   ALT 28 10/13/2014 1917   ALKPHOS 61 10/13/2014 1917   BILITOT 0.8 10/13/2014 1917   GFRNONAA 88* 10/13/2014 1917   GFRAA >90 10/13/2014 1917     Recent Labs Lab 10/12/14 1200 10/13/14 1917  LIPASE 5.0* 24    No results for input(s): CKTOTAL, CKMB, CKMBINDEX, TROPONINI in the last 168 hours. BNP (last 3 results) No results for input(s): BNP in the last 8760 hours.  ProBNP (last 3 results) No results for input(s): PROBNP in the last 8760 hours.   Radiological Exams on Admission: Ct Abdomen Pelvis W Contrast  10/13/2014   CLINICAL DATA:  Low abdominal pain and fever.  EXAM: CT ABDOMEN AND PELVIS WITH CONTRAST  TECHNIQUE: Multidetector CT imaging of the abdomen and pelvis was performed using the standard protocol following bolus administration of intravenous contrast.  CONTRAST:  164mL OMNIPAQUE IOHEXOL 300 MG/ML  SOLN  COMPARISON:  None.  FINDINGS: BODY WALL: Unremarkable.  LOWER CHEST: Unremarkable.  ABDOMEN/PELVIS:  Liver: Sub cm low-density liver likely reflects cysts.  Biliary: No evidence of biliary obstruction or stone.  Pancreas: Unremarkable.  Spleen: Unremarkable.  Adrenals: Unremarkable.  Kidneys and ureters: No hydronephrosis or stone.  Bladder: Unremarkable.  Reproductive: Unremarkable.  Bowel: Bowel perforation in the low mid abdomen with 4 cm gas and fluid containing abscess that also contains oral contrast. A tract of oral contrast extends towards the sigmoid colon, favoring a diverticular perforation. The appendix base is contiguous with the abscess and is overdistended and avidly enhancing. While the appendix is likely obstructed, the inflammatory changes around the tip and mid portion are not as extensive as expected for primary appendicitis. There is terminal ileitis with circumferential submucosal edema, likely secondary. Bowel encompasses most of the abscess circumference, making percutaneous drainage difficult.  Retroperitoneum: No mass or  adenopathy.  Peritoneum: No ascites or pneumoperitoneum.  Vascular: No acute abnormality.  OSSEOUS: No acute abnormalities.  These results were called by telephone at the time of interpretation on 10/13/2014 at 4:00 pm to Dr. [name] , who verbally acknowledged these results.  IMPRESSION: Contained bowel perforation in the low abdomen with 4 cm abscess. A diverticular source is favored over ruptured appendix, as reasoned above.   Electronically Signed   By: Monte Fantasia M.D.   On: 10/13/2014 16:04    Assessment/Plan Principal Problem:   Diverticulitis of intestine with perforation Active Problems:   Complex partial seizures   1. Diverticulitis of intestine with perforation The patient is presenting with numbness of abdominal pain. He does not have any significant leukocytosis nor he has any fever and appears splenomegaly stable. Does not have any significant tenderness on exam. A CT scan of the abdomen was performed which is showing 4 cm  abscess with free air likely suspicious for sigmoid diverticulitis. General surgery recommends medicine admission with conservative management. Patient was started on Zosyn in the ER. At present I would continue with Zosyn currently. IV hydration. Patient will remain nothing by mouth except medication. IV Zofran as needed IV morphine as needed for management. Further workup depending on surgical recommendation and evaluation.  2. Contact partial seizure. No recent seizures reported. Continue with the home dose of Tegretol.  Advance goals of care discussion: Full code   Consults: Gen. surgery, Dr. Barry Dienes, they will follow-up with patient  DVT Prophylaxis: subcutaneous Heparin Nutrition: Nothing by mouth  Family Communication: Father was present at bedside, opportunity was given to ask question and all questions were answered satisfactorily at the time of interview. Disposition: Admitted to inpatient in med-surge unit.  Author: Berle Mull, MD [facility] Pager: [phone] 10/13/2014, 11:52 PM    If 7PM-7AM, please contact night-coverage [email or web address] Password [email or web address]

## 2014-10-13 NOTE — ED Notes (Signed)
The pt has had abd pain since last Friday no n v or doctor and had a c-t abd and something about infection inn his abd

## 2014-10-13 NOTE — ED Provider Notes (Signed)
CSN: [id number]     Arrival date & time 10/13/14  5427 History   First MD Initiated Contact with Patient 10/13/14 2135     Chief Complaint  Patient presents with  . Abdominal Pain     (Consider location/radiation/quality/duration/timing/severity/associated sxs/prior Treatment) HPI Edward Adkins is a 44 y.o. male with hx of seizures, MR, CP, presents to ED with complaint of abdominal pain. Patient states his pain began 3 days ago. 2 days ago he went to his primary care doctor who ordered a CT scan outpatient. He had a CT scan today and was called and told to come to the hospital because he had an infection. Patient reports similar pain about 2 months ago. He states he did not tell anybody in the [facility] after several days. Patient reports subjective fever and chills, did not take temperature at home. He denies any nausea or vomiting. He is unsure if there has been change in his bowels. He was started on Cipro by his primary care doctor which she has taken since Monday and states his pain is actually improving some. Patient denies any other complaints. States movement and palpation of his abdomen makes symptoms worse. Nothing makes it better. No hx of prior abdominal surgeries.   Past Medical History  Diagnosis Date  . Complex partial seizure disorder 05/78    Head CT's multiple-WNL, EEG R temporal lobe discharge (GNA)  . Cerebral palsy   . Mild mental retardation    History reviewed. No pertinent past surgical history. Family History  Problem Relation Age of Onset  . Cancer Mother 40    Leukemia  . Cancer Father 37    Prostate ca  . CAD Father   . Diabetes Father    History  Substance Use Topics  . Smoking status: Never Smoker   . Smokeless tobacco: Never Used  . Alcohol Use: No    Review of Systems  Constitutional: Negative for fever and chills.  Respiratory: Negative for cough, chest tightness and shortness of breath.   Cardiovascular: Negative for chest pain, palpitations and  leg swelling.  Gastrointestinal: Positive for nausea and abdominal pain. Negative for vomiting, diarrhea and abdominal distention.  Genitourinary: Negative for dysuria, urgency, frequency and hematuria.  Musculoskeletal: Negative for myalgias, arthralgias, neck pain and neck stiffness.  Skin: Negative for rash.  Allergic/Immunologic: Negative for immunocompromised state.  Neurological: Negative for dizziness, weakness, light-headedness, numbness and headaches.      Allergies  Phenobarbital  Home Medications   Prior to Admission medications   Medication Sig Start Date End Date Taking? Authorizing Provider  carbamazepine (TEGRETOL) 200 MG tablet Take 1 tablet (200 mg total) by mouth 3 (three) times daily. 08/06/14   Dennie Bible, NP  ciprofloxacin (CIPRO) 500 MG tablet Take 1 tablet (500 mg total) by mouth 2 (two) times daily. 10/12/14   [name], MD  Multiple Vitamin (MULTIVITAMIN) tablet Take 1 tablet by mouth daily.      Historical Provider, MD   BP 114/73 mmHg  Pulse 97  Temp(Src) 98.1 F (36.7 C)  Resp 20  Ht 6' (1.829 m)  Wt 222 lb (100.699 kg)  BMI 30.10 kg/m2  SpO2 98% Physical Exam  Constitutional: He appears well-developed and well-nourished. No distress.  HENT:  Head: Normocephalic and atraumatic.  Eyes: Conjunctivae are normal.  Neck: Neck supple.  Cardiovascular: Normal rate, regular rhythm and normal heart sounds.   Pulmonary/Chest: Effort normal. No respiratory distress. He has no wheezes. He has no rales.  Abdominal:  Soft. Bowel sounds are normal. He exhibits no distension. There is tenderness. There is no rebound.  RLQ, lower abdominal tenderness  Musculoskeletal: He exhibits no edema.  Neurological: He is alert.  Skin: Skin is warm and dry.  Nursing note and vitals reviewed.   ED Course  Procedures (including critical care time) Labs Review Labs Reviewed  CBC WITH DIFFERENTIAL/PLATELET - Abnormal; Notable for the following:    WBC 10.9  (*)    Neutro Abs 8.3 (*)    All other components within normal limits  COMPREHENSIVE METABOLIC PANEL - Abnormal; Notable for the following:    Sodium 134 (*)    Glucose, Bld 112 (*)    GFR calc non Af Amer 88 (*)    All other components within normal limits  URINALYSIS, ROUTINE W REFLEX MICROSCOPIC - Abnormal; Notable for the following:    APPearance CLOUDY (*)    Hgb urine dipstick MODERATE (*)    Bilirubin Urine SMALL (*)    Protein, ur 100 (*)    All other components within normal limits  URINE MICROSCOPIC-ADD ON - Abnormal; Notable for the following:    Squamous Epithelial / LPF FEW (*)    Bacteria, UA FEW (*)    All other components within normal limits  LIPASE, BLOOD    Imaging Review Ct Abdomen Pelvis W Contrast  10/13/2014   CLINICAL DATA:  Low abdominal pain and fever.  EXAM: CT ABDOMEN AND PELVIS WITH CONTRAST  TECHNIQUE: Multidetector CT imaging of the abdomen and pelvis was performed using the standard protocol following bolus administration of intravenous contrast.  CONTRAST:  13mL OMNIPAQUE IOHEXOL 300 MG/ML  SOLN  COMPARISON:  None.  FINDINGS: BODY WALL: Unremarkable.  LOWER CHEST: Unremarkable.  ABDOMEN/PELVIS:  Liver: Sub cm low-density liver likely reflects cysts.  Biliary: No evidence of biliary obstruction or stone.  Pancreas: Unremarkable.  Spleen: Unremarkable.  Adrenals: Unremarkable.  Kidneys and ureters: No hydronephrosis or stone.  Bladder: Unremarkable.  Reproductive: Unremarkable.  Bowel: Bowel perforation in the low mid abdomen with 4 cm gas and fluid containing abscess that also contains oral contrast. A tract of oral contrast extends towards the sigmoid colon, favoring a diverticular perforation. The appendix base is contiguous with the abscess and is overdistended and avidly enhancing. While the appendix is likely obstructed, the inflammatory changes around the tip and mid portion are not as extensive as expected for primary appendicitis. There is terminal  ileitis with circumferential submucosal edema, likely secondary. Bowel encompasses most of the abscess circumference, making percutaneous drainage difficult.  Retroperitoneum: No mass or adenopathy.  Peritoneum: No ascites or pneumoperitoneum.  Vascular: No acute abnormality.  OSSEOUS: No acute abnormalities.  These results were called by telephone at the time of interpretation on 10/13/2014 at 4:00 pm to Dr. [name] , who verbally acknowledged these results.  IMPRESSION: Contained bowel perforation in the low abdomen with 4 cm abscess. A diverticular source is favored over ruptured appendix, as reasoned above.   Electronically Signed   By: Monte Fantasia M.D.   On: 10/13/2014 16:04     EKG Interpretation None      MDM   Final diagnoses:  Intra-abdominal abscess  Perforation bowel    Patient in emergency department with abdominal pain, had an outpatient CT scan done which showed a contained perforation of bowels with a 4 cm abscess. Most likely from diverticular source. I discussed this with general surgeon Dr. Barry Dienes, as for tried to admit.  Spoke with [facility] Posey Pronto [facility],  he will come see patient.  Patient neurovascularly intact, appears to be comfortable, offered pain medications, he refused. IV fluids started and Zosyn ordered.  Filed Vitals:   10/13/14 2230 10/13/14 2245 10/13/14 2300 10/13/14 2315  BP: 109/71 111/79 110/67 112/71  Pulse: 98 96 96 86  Temp:      TempSrc:      Resp: 18 16 16 16   Height:      Weight:      SpO2: 100% 100% 100% 100%       Renold Genta, PA-C 10/14/14 [place], MD 10/14/14 1450

## 2014-10-14 ENCOUNTER — Encounter (HOSPITAL_COMMUNITY): Payer: Self-pay | Admitting: General Practice

## 2014-10-14 DIAGNOSIS — K5720 Diverticulitis of large intestine with perforation and abscess without bleeding: Secondary | ICD-10-CM | POA: Diagnosis not present

## 2014-10-14 DIAGNOSIS — K5780 Diverticulitis of intestine, part unspecified, with perforation and abscess without bleeding: Secondary | ICD-10-CM | POA: Diagnosis present

## 2014-10-14 DIAGNOSIS — Z888 Allergy status to other drugs, medicaments and biological substances status: Secondary | ICD-10-CM | POA: Diagnosis not present

## 2014-10-14 DIAGNOSIS — G809 Cerebral palsy, unspecified: Secondary | ICD-10-CM | POA: Diagnosis present

## 2014-10-14 DIAGNOSIS — E785 Hyperlipidemia, unspecified: Secondary | ICD-10-CM | POA: Diagnosis present

## 2014-10-14 DIAGNOSIS — R109 Unspecified abdominal pain: Secondary | ICD-10-CM | POA: Diagnosis not present

## 2014-10-14 DIAGNOSIS — G40209 Localization-related (focal) (partial) symptomatic epilepsy and epileptic syndromes with complex partial seizures, not intractable, without status epilepticus: Secondary | ICD-10-CM | POA: Diagnosis present

## 2014-10-14 DIAGNOSIS — K651 Peritoneal abscess: Secondary | ICD-10-CM | POA: Diagnosis not present

## 2014-10-14 DIAGNOSIS — L0291 Cutaneous abscess, unspecified: Secondary | ICD-10-CM | POA: Diagnosis not present

## 2014-10-14 DIAGNOSIS — G40219 Localization-related (focal) (partial) symptomatic epilepsy and epileptic syndromes with complex partial seizures, intractable, without status epilepticus: Secondary | ICD-10-CM | POA: Diagnosis not present

## 2014-10-14 DIAGNOSIS — K630 Abscess of intestine: Secondary | ICD-10-CM | POA: Diagnosis not present

## 2014-10-14 DIAGNOSIS — K5700 Diverticulitis of small intestine with perforation and abscess without bleeding: Secondary | ICD-10-CM | POA: Diagnosis not present

## 2014-10-14 DIAGNOSIS — L03311 Cellulitis of abdominal wall: Secondary | ICD-10-CM | POA: Diagnosis not present

## 2014-10-14 DIAGNOSIS — F70 Mild intellectual disabilities: Secondary | ICD-10-CM | POA: Diagnosis present

## 2014-10-14 HISTORY — DX: Diverticulitis of intestine, part unspecified, with perforation and abscess without bleeding: K57.80

## 2014-10-14 LAB — PROTIME-INR
INR: 1.2 (ref 0.00–1.49)
PROTHROMBIN TIME: 15.4 s — AB (ref 11.6–15.2)

## 2014-10-14 LAB — CBC
HCT: 36.7 % — ABNORMAL LOW (ref 39.0–52.0)
Hemoglobin: 12.5 g/dL — ABNORMAL LOW (ref 13.0–17.0)
MCH: 30.6 pg (ref 26.0–34.0)
MCHC: 34.1 g/dL (ref 30.0–36.0)
MCV: 89.7 fL (ref 78.0–100.0)
PLATELETS: 196 10*3/uL (ref 150–400)
RBC: 4.09 MIL/uL — ABNORMAL LOW (ref 4.22–5.81)
RDW: 12.6 % (ref 11.5–15.5)
WBC: 8 10*3/uL (ref 4.0–10.5)

## 2014-10-14 LAB — COMPREHENSIVE METABOLIC PANEL
ALBUMIN: 3.1 g/dL — AB (ref 3.5–5.2)
ALT: 31 U/L (ref 0–53)
AST: 29 U/L (ref 0–37)
Alkaline Phosphatase: 52 U/L (ref 39–117)
Anion gap: 11 (ref 5–15)
BILIRUBIN TOTAL: 0.7 mg/dL (ref 0.3–1.2)
BUN: 15 mg/dL (ref 6–23)
CALCIUM: 8.1 mg/dL — AB (ref 8.4–10.5)
CHLORIDE: 104 mmol/L (ref 96–112)
CO2: 22 mmol/L (ref 19–32)
CREATININE: 0.97 mg/dL (ref 0.50–1.35)
GFR calc Af Amer: >90 mL/min (ref >90)
Glucose, Bld: 105 mg/dL — ABNORMAL HIGH (ref 70–99)
Potassium: 3.4 mmol/L — ABNORMAL LOW (ref 3.5–5.1)
SODIUM: 137 mmol/L (ref 135–145)
Total Protein: 7 g/dL (ref 6.0–8.3)

## 2014-10-14 MED ORDER — MORPHINE SULFATE 2 MG/ML IJ SOLN
2.0000 mg | INTRAMUSCULAR | Status: DC | PRN
  Administered 2014-10-19 (×2): 2 mg via INTRAVENOUS
  Filled 2014-10-14 (×2): qty 1

## 2014-10-14 MED ORDER — FAMOTIDINE IN NACL 20-0.9 MG/50ML-% IV SOLN
INTRAVENOUS | Status: AC
  Administered 2014-10-14
  Filled 2014-10-14: qty 50

## 2014-10-14 MED ORDER — PIPERACILLIN-TAZOBACTAM 3.375 G IVPB
3.3750 g | Freq: Three times a day (TID) | INTRAVENOUS | Status: DC
  Administered 2014-10-14 – 2014-10-22 (×26): 3.375 g via INTRAVENOUS
  Filled 2014-10-14 (×30): qty 50

## 2014-10-14 MED ORDER — HEPARIN SODIUM (PORCINE) 5000 UNIT/ML IJ SOLN
INTRAMUSCULAR | Status: AC
  Administered 2014-10-14
  Filled 2014-10-14: qty 1

## 2014-10-14 MED ORDER — HEPARIN SODIUM (PORCINE) 5000 UNIT/ML IJ SOLN
5000.0000 [IU] | Freq: Three times a day (TID) | INTRAMUSCULAR | Status: DC
  Administered 2014-10-14 – 2014-10-19 (×15): 5000 [IU] via SUBCUTANEOUS
  Filled 2014-10-14 (×18): qty 1

## 2014-10-14 MED ORDER — CARBAMAZEPINE 200 MG PO TABS
200.0000 mg | ORAL_TABLET | Freq: Three times a day (TID) | ORAL | Status: DC
  Administered 2014-10-14 – 2014-10-22 (×25): 200 mg via ORAL
  Filled 2014-10-14 (×28): qty 1

## 2014-10-14 MED ORDER — SODIUM CHLORIDE 0.9 % IV SOLN
INTRAVENOUS | Status: DC
  Administered 2014-10-14 – 2014-10-16 (×2): via INTRAVENOUS

## 2014-10-14 MED ORDER — ONDANSETRON HCL 4 MG/2ML IJ SOLN
4.0000 mg | Freq: Four times a day (QID) | INTRAMUSCULAR | Status: DC | PRN
  Administered 2014-10-19: 4 mg via INTRAVENOUS
  Filled 2014-10-14: qty 2

## 2014-10-14 MED ORDER — FAMOTIDINE IN NACL 20-0.9 MG/50ML-% IV SOLN
20.0000 mg | INTRAVENOUS | Status: DC
Start: 2014-10-14 — End: 2014-10-20
  Administered 2014-10-15 – 2014-10-20 (×6): 20 mg via INTRAVENOUS
  Filled 2014-10-14 (×7): qty 50

## 2014-10-14 MED ORDER — ONDANSETRON HCL 4 MG PO TABS: 4.0000 mg | ORAL_TABLET | Freq: Four times a day (QID) | ORAL | Status: DC | PRN

## 2014-10-14 NOTE — Consult Note (Signed)
Reason for Consult:diverticular abscess Referring Provider:  Richmond Campbell, PA-C  Edward Adkins is an 44 y.o. male.  HPI:  Pt is a 44 yo M who has been having crampy abdominal pain on and off since Saturday (4 days pta). He received cipro from his PCP and a scan was ordered. He has not taken his temperature, but he has had some fever/chills.  No n/v.  No diarrhea/constipation.    He had a similar episode a few months ago, but it resolved spontaneously.  It was not as severe.    Past Medical History  Diagnosis Date  . Complex partial seizure disorder 05/78    Head CT's multiple-WNL, EEG R temporal lobe discharge (GNA)  . Cerebral palsy   . Mild mental retardation     History reviewed. No pertinent past surgical history.  Family History  Problem Relation Age of Onset  . Cancer Mother 41    Leukemia  . Cancer Father 59    Prostate ca  . CAD Father   . Diabetes Father     Social History:  reports that he has never smoked. He has never used smokeless tobacco. He reports that he does not drink alcohol or use illicit drugs.  Allergies:  Allergies  Allergen Reactions  . Phenobarbital     REACTION: rash    Medications: Prior to Admission: Tegretol, cipro, mvt  Results for orders placed or performed during the hospital encounter of 10/13/14 (from the past 48 hour(s))  CBC with Differential     Status: Abnormal   Collection Time: 10/13/14  7:17 PM  Result Value Ref Range   WBC 10.9 (H) 4.0 - 10.5 K/uL   RBC 4.60 4.22 - 5.81 MIL/uL   Hemoglobin 14.1 13.0 - 17.0 g/dL   HCT 41.0 39.0 - 52.0 %   MCV 89.1 78.0 - 100.0 fL   MCH 30.7 26.0 - 34.0 pg   MCHC 34.4 30.0 - 36.0 g/dL   RDW 12.7 11.5 - 15.5 %   Platelets 224 150 - 400 K/uL   Neutrophils Relative % 76 43 - 77 %   Neutro Abs 8.3 (H) 1.7 - 7.7 K/uL   Lymphocytes Relative 15 12 - 46 %   Lymphs Abs 1.7 0.7 - 4.0 K/uL   Monocytes Relative 8 3 - 12 %   Monocytes Absolute 0.9 0.1 - 1.0 K/uL   Eosinophils Relative 1 0 -  5 %   Eosinophils Absolute 0.1 0.0 - 0.7 K/uL   Basophils Relative 0 0 - 1 %   Basophils Absolute 0.0 0.0 - 0.1 K/uL  Comprehensive metabolic panel     Status: Abnormal   Collection Time: 10/13/14  7:17 PM  Result Value Ref Range   Sodium 134 (L) 135 - 145 mmol/L   Potassium 4.0 3.5 - 5.1 mmol/L   Chloride 99 96 - 112 mmol/L   CO2 24 19 - 32 mmol/L   Glucose, Bld 112 (H) 70 - 99 mg/dL   BUN 18 6 - 23 mg/dL   Creatinine, Ser 1.04 0.50 - 1.35 mg/dL   Calcium 8.6 8.4 - 10.5 mg/dL   Total Protein 8.2 6.0 - 8.3 g/dL   Albumin 3.7 3.5 - 5.2 g/dL   AST 33 0 - 37 U/L   ALT 28 0 - 53 U/L   Alkaline Phosphatase 61 39 - 117 U/L   Total Bilirubin 0.8 0.3 - 1.2 mg/dL   GFR calc non Af Amer 88 (L) >90 mL/min   GFR  calc Af Amer >90 >90 mL/min    Comment: (NOTE) The eGFR has been calculated using the CKD EPI equation. This calculation has not been validated in all clinical situations. eGFR's persistently <90 mL/min signify possible Chronic Kidney Disease.    Anion gap 11 5 - 15  Lipase, blood     Status: None   Collection Time: 10/13/14  7:17 PM  Result Value Ref Range   Lipase 24 11 - 59 U/L  Urinalysis, Routine w reflex microscopic     Status: Abnormal   Collection Time: 10/13/14  7:26 PM  Result Value Ref Range   Color, Urine YELLOW YELLOW   APPearance CLOUDY (A) CLEAR   Specific Gravity, Urine 1.015 1.005 - 1.030   pH 5.0 5.0 - 8.0   Glucose, UA NEGATIVE NEGATIVE mg/dL   Hgb urine dipstick MODERATE (A) NEGATIVE   Bilirubin Urine SMALL (A) NEGATIVE   Ketones, ur NEGATIVE NEGATIVE mg/dL   Protein, ur 100 (A) NEGATIVE mg/dL   Urobilinogen, UA 0.2 0.0 - 1.0 mg/dL   Nitrite NEGATIVE NEGATIVE   Leukocytes, UA NEGATIVE NEGATIVE  Urine microscopic-add on     Status: Abnormal   Collection Time: 10/13/14  7:26 PM  Result Value Ref Range   Squamous Epithelial / LPF FEW (A) RARE   WBC, UA 0-2 <3 WBC/hpf   RBC / HPF 3-6 <3 RBC/hpf   Bacteria, UA FEW (A) RARE   Urine-Other MUCOUS  PRESENT   Comprehensive metabolic panel     Status: Abnormal   Collection Time: 10/14/14  2:45 AM  Result Value Ref Range   Sodium 137 135 - 145 mmol/L   Potassium 3.4 (L) 3.5 - 5.1 mmol/L   Chloride 104 96 - 112 mmol/L   CO2 22 19 - 32 mmol/L   Glucose, Bld 105 (H) 70 - 99 mg/dL   BUN 15 6 - 23 mg/dL   Creatinine, Ser 0.97 0.50 - 1.35 mg/dL   Calcium 8.1 (L) 8.4 - 10.5 mg/dL   Total Protein 7.0 6.0 - 8.3 g/dL   Albumin 3.1 (L) 3.5 - 5.2 g/dL   AST 29 0 - 37 U/L   ALT 31 0 - 53 U/L   Alkaline Phosphatase 52 39 - 117 U/L   Total Bilirubin 0.7 0.3 - 1.2 mg/dL   GFR calc non Af Amer >90 >90 mL/min   GFR calc Af Amer >90 >90 mL/min    Comment: (NOTE) The eGFR has been calculated using the CKD EPI equation. This calculation has not been validated in all clinical situations. eGFR's persistently <90 mL/min signify possible Chronic Kidney Disease.    Anion gap 11 5 - 15  CBC     Status: Abnormal   Collection Time: 10/14/14  2:45 AM  Result Value Ref Range   WBC 8.0 4.0 - 10.5 K/uL   RBC 4.09 (L) 4.22 - 5.81 MIL/uL   Hemoglobin 12.5 (L) 13.0 - 17.0 g/dL   HCT 36.7 (L) 39.0 - 52.0 %   MCV 89.7 78.0 - 100.0 fL   MCH 30.6 26.0 - 34.0 pg   MCHC 34.1 30.0 - 36.0 g/dL   RDW 12.6 11.5 - 15.5 %   Platelets 196 150 - 400 K/uL  Protime-INR     Status: Abnormal   Collection Time: 10/14/14  2:45 AM  Result Value Ref Range   Prothrombin Time 15.4 (H) 11.6 - 15.2 seconds   INR 1.20 0.00 - 1.49    Ct Abdomen Pelvis W Contrast  10/13/2014  CLINICAL DATA:  Low abdominal pain and fever.  EXAM: CT ABDOMEN AND PELVIS WITH CONTRAST  TECHNIQUE: Multidetector CT imaging of the abdomen and pelvis was performed using the standard protocol following bolus administration of intravenous contrast.  CONTRAST:  125m OMNIPAQUE IOHEXOL 300 MG/ML  SOLN  COMPARISON:  None.  FINDINGS: BODY WALL: Unremarkable.  LOWER CHEST: Unremarkable.  ABDOMEN/PELVIS:  Liver: Sub cm low-density liver likely reflects cysts.   Biliary: No evidence of biliary obstruction or stone.  Pancreas: Unremarkable.  Spleen: Unremarkable.  Adrenals: Unremarkable.  Kidneys and ureters: No hydronephrosis or stone.  Bladder: Unremarkable.  Reproductive: Unremarkable.  Bowel: Bowel perforation in the low mid abdomen with 4 cm gas and fluid containing abscess that also contains oral contrast. A tract of oral contrast extends towards the sigmoid colon, favoring a diverticular perforation. The appendix base is contiguous with the abscess and is overdistended and avidly enhancing. While the appendix is likely obstructed, the inflammatory changes around the tip and mid portion are not as extensive as expected for primary appendicitis. There is terminal ileitis with circumferential submucosal edema, likely secondary. Bowel encompasses most of the abscess circumference, making percutaneous drainage difficult.  Retroperitoneum: No mass or adenopathy.  Peritoneum: No ascites or pneumoperitoneum.  Vascular: No acute abnormality.  OSSEOUS: No acute abnormalities.  These results were called by telephone at the time of interpretation on 10/13/2014 at 4:00 pm to Dr. JRia Bush, who verbally acknowledged these results.  IMPRESSION: Contained bowel perforation in the low abdomen with 4 cm abscess. A diverticular source is favored over ruptured appendix, as reasoned above.   Electronically Signed   By: JMonte FantasiaM.D.   On: 10/13/2014 16:04    Review of Systems  Constitutional: Positive for fever and chills.  HENT: Negative.   Eyes: Negative.   Respiratory: Negative.   Cardiovascular: Negative.   Gastrointestinal: Positive for abdominal pain. Negative for nausea, vomiting and diarrhea.  Genitourinary: Negative.   Musculoskeletal: Negative.   Skin: Negative.   Neurological: Negative.   Endo/Heme/Allergies: Negative.   Psychiatric/Behavioral: Negative.    Blood pressure 111/71, pulse 89, temperature 98.8 F (37.1 C), temperature source Oral,  resp. rate 16, height 6' (1.829 m), weight 222 lb (100.699 kg), SpO2 98 %. Physical Exam  Constitutional: He is oriented to person, place, and time. He appears well-developed and well-nourished. No distress.  HENT:  Head: Normocephalic and atraumatic.  Eyes: Conjunctivae are normal. Pupils are equal, round, and reactive to light. No scleral icterus.  Neck: Normal range of motion. Neck supple.  Cardiovascular: Normal rate, regular rhythm and intact distal pulses.   Respiratory: Effort normal. No respiratory distress.  GI: Soft. Bowel sounds are normal. He exhibits no distension. There is tenderness (LLQ/Mid abdomen).  Neurological: He is alert and oriented to person, place, and time.  Skin: Skin is warm and dry. No rash noted. He is not diaphoretic. No erythema. No pallor.  Psychiatric: He has a normal mood and affect. His behavior is normal. Judgment and thought content normal.    Assessment/Plan: Sigmoid diverticulitis with microperforation and abscess vs crohn's terminal ileitis with abscess.    IV antibiotics Bowel rest Repeat CT scan later if no improvement.   Would like to get drain, but no good window for IR to access.   CCS to follow.    Gaelle Adriance 10/14/2014, 6:42 AM

## 2014-10-14 NOTE — Progress Notes (Signed)
Patient ID: Edward Adkins, male   DOB: [birth year], 44 y.o.   MRN: [id number]    Request for abdominal abscess drain placement Dr Anselm Pancoast has reviewed imaging and unfortunately feels NO safe window to access collection  Have discussed with Saverio Danker [facility]

## 2014-10-14 NOTE — Progress Notes (Signed)
[facility] PROGRESS NOTE  Edward Adkins OMV:[id number] DOB: [birth year] DOA: 10/13/2014 PCP: Edward Bush, MD  Summary I have seen and examined Edward Adkins at bedside and reviewed his chart. Appreciate general surgery. Edward Adkins is a 44 y.o. male with Past medical history of cerebral palsy with complex partial seizure who presented with complaints of abdominal pain and was found to have acute sigmoid diverticulitis with perforation and small diverticular abscess of 4 cm in diameter. CT abdomen and pelvis showed "Contained bowel perforation in the low abdomen with 4 cm abscess. A diverticular source is favored over ruptured appendix, as reasoned above". He is on Zosyn and his white count has improved from 17,700 to 8000. Interventional radiology felt abscess not amenable to drainage at this point. Will continue current antibiotics and follow general surgery recommendations. Patient reports that pain is better. Plan Diverticulitis of intestine with perforation  Continue Zosyn  Follow general surgery recommendations Complex partial seizures/Hyperlipidemia/Cerebral palsy  No acute changes DVT GI prophylaxis Code Status: Full Code Family Communication: Spoke with patient's father Edward Adkins at [phone], and gave him a heads up. Disposition Plan: Eventually home   Consultants:  GEN surgery  Procedures:  None  Antibiotics:  Zosyn>10/12/2014  HPI/Subjective: Feels better.  Objective: Filed Vitals:   10/14/14 0815  BP: 105/69  Pulse: 67  Temp:   Resp: 18   No intake or output data in the 24 hours ending 10/14/14 1201 Filed Weights   10/13/14 1911  Weight: 100.699 kg (222 lb)    Exam:   General:  Comfortable at rest.  Cardiovascular: S1-S2 normal. No murmurs. Pulse regular.  Respiratory: Good air entry bilaterally. No rhonchi or rales.  Abdomen: Soft and slightly tender to palpation, left lower quadrant with no rebound or guarding. Normal bowel  sounds. No organomegaly.  Musculoskeletal: No pedal edema   Neurological: No acute deficits  Data Reviewed: Basic Metabolic Panel:  Recent Labs Lab 10/12/14 1200 10/13/14 1917 10/14/14 0245  NA 134* 134* 137  K 3.9 4.0 3.4*  CL 98 99 104  CO2 25 24 22   GLUCOSE 97 112* 105*  BUN 17 18 15   CREATININE 0.99 1.04 0.97  CALCIUM 9.4 8.6 8.1*   Liver Function Tests:  Recent Labs Lab 10/12/14 1200 10/13/14 1917 10/14/14 0245  AST 12 33 29  ALT 17 28 31   ALKPHOS 56 61 52  BILITOT 0.8 0.8 0.7  PROT 8.7* 8.2 7.0  ALBUMIN 4.4 3.7 3.1*    Recent Labs Lab 10/12/14 1200 10/13/14 1917  LIPASE 5.0* 24   No results for input(s): AMMONIA in the last 168 hours. CBC:  Recent Labs Lab 10/12/14 1200 10/13/14 1917 10/14/14 0245  WBC 17.7* 10.9* 8.0  NEUTROABS 14.8* 8.3*  --   HGB 15.4 14.1 12.5*  HCT 45.3 41.0 36.7*  MCV 90.3 89.1 89.7  PLT 213.0 224 196   Cardiac Enzymes: No results for input(s): CKTOTAL, CKMB, CKMBINDEX, TROPONINI in the last 168 hours. BNP (last 3 results) No results for input(s): BNP in the last 8760 hours.  ProBNP (last 3 results) No results for input(s): PROBNP in the last 8760 hours.  CBG: No results for input(s): GLUCAP in the last 168 hours.  No results found for this or any previous visit (from the past 240 hour(s)).   Studies: Ct Abdomen Pelvis W Contrast  10/13/2014   CLINICAL DATA:  Low abdominal pain and fever.  EXAM: CT ABDOMEN AND PELVIS WITH CONTRAST  TECHNIQUE: Multidetector CT imaging of the  abdomen and pelvis was performed using the standard protocol following bolus administration of intravenous contrast.  CONTRAST:  155mL OMNIPAQUE IOHEXOL 300 MG/ML  SOLN  COMPARISON:  None.  FINDINGS: BODY WALL: Unremarkable.  LOWER CHEST: Unremarkable.  ABDOMEN/PELVIS:  Liver: Sub cm low-density liver likely reflects cysts.  Biliary: No evidence of biliary obstruction or stone.  Pancreas: Unremarkable.  Spleen: Unremarkable.  Adrenals:  Unremarkable.  Kidneys and ureters: No hydronephrosis or stone.  Bladder: Unremarkable.  Reproductive: Unremarkable.  Bowel: Bowel perforation in the low mid abdomen with 4 cm gas and fluid containing abscess that also contains oral contrast. A tract of oral contrast extends towards the sigmoid colon, favoring a diverticular perforation. The appendix base is contiguous with the abscess and is overdistended and avidly enhancing. While the appendix is likely obstructed, the inflammatory changes around the tip and mid portion are not as extensive as expected for primary appendicitis. There is terminal ileitis with circumferential submucosal edema, likely secondary. Bowel encompasses most of the abscess circumference, making percutaneous drainage difficult.  Retroperitoneum: No mass or adenopathy.  Peritoneum: No ascites or pneumoperitoneum.  Vascular: No acute abnormality.  OSSEOUS: No acute abnormalities.  These results were called by telephone at the time of interpretation on 10/13/2014 at 4:00 pm to Dr. Ria Adkins , who verbally acknowledged these results.  IMPRESSION: Contained bowel perforation in the low abdomen with 4 cm abscess. A diverticular source is favored over ruptured appendix, as reasoned above.   Electronically Signed   By: Monte Fantasia M.D.   On: 10/13/2014 16:04    Scheduled Meds: . carbamazepine  200 mg Oral TID  . heparin      . heparin  5,000 Units Subcutaneous 3 times per day   Continuous Infusions: . sodium chloride 100 mL/hr at 10/14/14 0015  . famotidine    . famotidine (PEPCID) IV    . piperacillin-tazobactam (ZOSYN)  IV Stopped (10/14/14 0705)     Time spent: 25 minutes    Edward Adkins  [facility] Pager [phone]. If 7PM-7AM, please contact night-coverage at [email or web address], password [facility] 10/14/2014, 12:01 PM  LOS: 0 days

## 2014-10-15 DIAGNOSIS — K5700 Diverticulitis of small intestine with perforation and abscess without bleeding: Secondary | ICD-10-CM

## 2014-10-15 DIAGNOSIS — G40219 Localization-related (focal) (partial) symptomatic epilepsy and epileptic syndromes with complex partial seizures, intractable, without status epilepticus: Secondary | ICD-10-CM

## 2014-10-15 LAB — CBC
HCT: 39.5 % (ref 39.0–52.0)
HEMOGLOBIN: 12.9 g/dL — AB (ref 13.0–17.0)
MCH: 30 pg (ref 26.0–34.0)
MCHC: 32.7 g/dL (ref 30.0–36.0)
MCV: 91.9 fL (ref 78.0–100.0)
Platelets: 216 10*3/uL (ref 150–400)
RBC: 4.3 MIL/uL (ref 4.22–5.81)
RDW: 12.4 % (ref 11.5–15.5)
WBC: 6.3 10*3/uL (ref 4.0–10.5)

## 2014-10-15 LAB — BASIC METABOLIC PANEL
ANION GAP: 11 (ref 5–15)
BUN: 15 mg/dL (ref 6–23)
CHLORIDE: 107 mmol/L (ref 96–112)
CO2: 20 mmol/L (ref 19–32)
CREATININE: 0.87 mg/dL (ref 0.50–1.35)
Calcium: 8.3 mg/dL — ABNORMAL LOW (ref 8.4–10.5)
GFR calc non Af Amer: >90 mL/min (ref >90)
Glucose, Bld: 78 mg/dL (ref 70–99)
POTASSIUM: 4.2 mmol/L (ref 3.5–5.1)
Sodium: 138 mmol/L (ref 135–145)

## 2014-10-15 NOTE — Progress Notes (Signed)
[facility] PROGRESS NOTE  MUJAHID JALOMO WCB:[id number] DOB: [birth year] DOA: 10/13/2014 PCP: [name], MD  Brief Narrative: Edward Adkins is a 44 y.o. male with Past medical history of cerebral palsy with complex partial seizure who presented with complaints of abdominal pain and was found to have acute sigmoid diverticulitis with perforation and small diverticular abscess of 4 cm in diameter. CT abdomen and pelvis showed "Contained bowel perforation in the low abdomen with 4 cm abscess. A diverticular source is favored over ruptured appendix, as reasoned above". He is on Zosyn and his white count has improved from 17,700 to 8000. Interventional radiology felt abscess not amenable to drainage at this point. Will continue current antibiotics and follow general surgery recommendations  Assessment/Plan: Diverticulitis of intestine with perforation  Continue Zosyn  Follow general surgery recommendations  Agree with mobilizing as tolerated  Continue supportive therapy  Complex partial seizures/Hyperlipidemia/Cerebral palsy  Stable continue home regimen.  Code Status: Full Family Communication: No family at bedside  Disposition Plan: Pending improvement in condition and recommendations from specialist   Consultants:  General surgery  Procedures:  None  Antibiotics:  Zosyn  HPI/Subjective: The patient has no new complaints today. States he feels better but still having some lower abdominal discomfort  Objective: Filed Vitals:   10/15/14 1100  BP: 105/75  Pulse: 65  Temp: 98.7 F (37.1 C)  Resp: 16    Intake/Output Summary (Last 24 hours) at 10/15/14 1835 Last data filed at 10/15/14 0120  Gross per 24 hour  Intake    200 ml  Output      0 ml  Net    200 ml   Filed Weights   10/13/14 1911 10/14/14 2047  Weight: 100.699 kg (222 lb) 101 kg (222 lb 10.6 oz)    Exam:   General:  Patient in no acute distress, alert and awake  Cardiovascular: Rate  and rhythm, no murmurs or rubs  Respiratory: Here to auscultation bilaterally, equal chest rise, no wheezes  Abdomen: Soft, nondistended, mild tenderness with deep palpation in lower quadrants  Musculoskeletal: No cyanosis or clubbing   Data Reviewed: Basic Metabolic Panel:  Recent Labs Lab 10/12/14 1200 10/13/14 1917 10/14/14 0245 10/15/14 0733  NA 134* 134* 137 138  K 3.9 4.0 3.4* 4.2  CL 98 99 104 107  CO2 25 24 22 20   GLUCOSE 97 112* 105* 78  BUN 17 18 15 15   CREATININE 0.99 1.04 0.97 0.87  CALCIUM 9.4 8.6 8.1* 8.3*   Liver Function Tests:  Recent Labs Lab 10/12/14 1200 10/13/14 1917 10/14/14 0245  AST 12 33 29  ALT 17 28 31   ALKPHOS 56 61 52  BILITOT 0.8 0.8 0.7  PROT 8.7* 8.2 7.0  ALBUMIN 4.4 3.7 3.1*    Recent Labs Lab 10/12/14 1200 10/13/14 1917  LIPASE 5.0* 24   No results for input(s): AMMONIA in the last 168 hours. CBC:  Recent Labs Lab 10/12/14 1200 10/13/14 1917 10/14/14 0245 10/15/14 0733  WBC 17.7* 10.9* 8.0 6.3  NEUTROABS 14.8* 8.3*  --   --   HGB 15.4 14.1 12.5* 12.9*  HCT 45.3 41.0 36.7* 39.5  MCV 90.3 89.1 89.7 91.9  PLT 213.0 224 196 216   Cardiac Enzymes: No results for input(s): CKTOTAL, CKMB, CKMBINDEX, TROPONINI in the last 168 hours. BNP (last 3 results) No results for input(s): BNP in the last 8760 hours.  ProBNP (last 3 results) No results for input(s): PROBNP in the last 8760 hours.  CBG:  No results for input(s): GLUCAP in the last 168 hours.  No results found for this or any previous visit (from the past 240 hour(s)).   Studies: No results found.  Scheduled Meds: . carbamazepine  200 mg Oral TID  . famotidine (PEPCID) IV  20 mg Intravenous Q24H  . heparin  5,000 Units Subcutaneous 3 times per day  . piperacillin-tazobactam (ZOSYN)  IV  3.375 g Intravenous 3 times per day   Continuous Infusions: . sodium chloride 100 mL/hr at 10/14/14 0015    Principal Problem:   Diverticulitis of intestine with  perforation Active Problems:   Complex partial seizures   Hyperlipidemia   Cerebral palsy    Time spent: > 35 minutes    Velvet Bathe  [facility] Pager [phone]. If 7PM-7AM, please contact night-coverage at [email or web address], password [facility] 10/15/2014, 6:35 PM  LOS: 1 day

## 2014-10-15 NOTE — Progress Notes (Signed)
Patient ID: Edward Adkins, male   DOB: April 17, 1971, 44 y.o.   MRN: [id number]     [facility]      [address] [place], [place] [address]    Phone: [phone] FAX: [phone]     Subjective: No pain.  No n/v.  No diarrhea.  Normal white count.  Afebrile.    Objective:  Vital signs:  Filed Vitals:   10/14/14 1414 10/14/14 1815 10/14/14 2047 10/15/14 0522  BP: 108/77 103/68 114/76 110/71  Pulse: 76 79 82 85  Temp: 98.2 F (36.8 C) 98.5 F (36.9 C) 97.8 F (36.6 C) 98.7 F (37.1 C)  TempSrc: Oral Oral Oral Oral  Resp:  16 16 15   Height:   6' (1.829 m)   Weight:   222 lb 10.6 oz (101 kg)   SpO2: 100% 100% 100% 98%       Intake/Output   Yesterday:  02/10 0701 - 02/11 0700 In: 200 [IV Piggyback:200] Out: -  This shift:      Physical Exam: General: Pt awake/alert/oriented x4 in no  acute distress  Abdomen: Soft.  Nondistended.  Non tender.  No evidence of peritonitis.  No incarcerated hernias.    Problem List:   Principal Problem:   Diverticulitis of intestine with perforation Active Problems:   Complex partial seizures   Hyperlipidemia   Cerebral palsy    Results:   Labs: Results for orders placed or performed during the hospital encounter of 10/13/14 (from the past 48 hour(s))  CBC with Differential     Status: Abnormal   Collection Time: 10/13/14  7:17 PM  Result Value Ref Range   WBC 10.9 (H) 4.0 - 10.5 K/uL   RBC 4.60 4.22 - 5.81 MIL/uL   Hemoglobin 14.1 13.0 - 17.0 g/dL   HCT 41.0 39.0 - 52.0 %   MCV 89.1 78.0 - 100.0 fL   MCH 30.7 26.0 - 34.0 pg   MCHC 34.4 30.0 - 36.0 g/dL   RDW 12.7 11.5 - 15.5 %   Platelets 224 150 - 400 K/uL   Neutrophils Relative % 76 43 - 77 %   Neutro Abs 8.3 (H) 1.7 - 7.7 K/uL   Lymphocytes Relative 15 12 - 46 %   Lymphs Abs 1.7 0.7 - 4.0 K/uL   Monocytes Relative 8 3 - 12 %   Monocytes Absolute 0.9 0.1 - 1.0 K/uL   Eosinophils Relative 1 0 - 5 %    Eosinophils Absolute 0.1 0.0 - 0.7 K/uL   Basophils Relative 0 0 - 1 %   Basophils Absolute 0.0 0.0 - 0.1 K/uL  Comprehensive metabolic panel     Status: Abnormal   Collection Time: 10/13/14  7:17 PM  Result Value Ref Range   Sodium 134 (L) 135 - 145 mmol/L   Potassium 4.0 3.5 - 5.1 mmol/L   Chloride 99 96 - 112 mmol/L   CO2 24 19 - 32 mmol/L   Glucose, Bld 112 (H) 70 - 99 mg/dL   BUN 18 6 - 23 mg/dL   Creatinine, Ser 1.04 0.50 - 1.35 mg/dL   Calcium 8.6 8.4 - 10.5 mg/dL   Total Protein 8.2 6.0 - 8.3 g/dL   Albumin 3.7 3.5 - 5.2 g/dL   AST 33 0 - 37 U/L   ALT 28 0 - 53 U/L   Alkaline Phosphatase 61 39 - 117 U/L   Total Bilirubin 0.8 0.3 - 1.2 mg/dL   GFR calc  non Af Amer 88 (L) >90 mL/min   GFR calc Af Amer >90 >90 mL/min    Comment: (NOTE) The eGFR has been calculated using the CKD EPI equation. This calculation has not been validated in all clinical situations. eGFR's persistently <90 mL/min signify possible Chronic Kidney Disease.    Anion gap 11 5 - 15  Lipase, blood     Status: None   Collection Time: 10/13/14  7:17 PM  Result Value Ref Range   Lipase 24 11 - 59 U/L  Urinalysis, Routine w reflex microscopic     Status: Abnormal   Collection Time: 10/13/14  7:26 PM  Result Value Ref Range   Color, Urine YELLOW YELLOW   APPearance CLOUDY (A) CLEAR   Specific Gravity, Urine 1.015 1.005 - 1.030   pH 5.0 5.0 - 8.0   Glucose, UA NEGATIVE NEGATIVE mg/dL   Hgb urine dipstick MODERATE (A) NEGATIVE   Bilirubin Urine SMALL (A) NEGATIVE   Ketones, ur NEGATIVE NEGATIVE mg/dL   Protein, ur 100 (A) NEGATIVE mg/dL   Urobilinogen, UA 0.2 0.0 - 1.0 mg/dL   Nitrite NEGATIVE NEGATIVE   Leukocytes, UA NEGATIVE NEGATIVE  Urine microscopic-add on     Status: Abnormal   Collection Time: 10/13/14  7:26 PM  Result Value Ref Range   Squamous Epithelial / LPF FEW (A) RARE   WBC, UA 0-2 <3 WBC/hpf   RBC / HPF 3-6 <3 RBC/hpf   Bacteria, UA FEW (A) RARE   Urine-Other MUCOUS PRESENT    Comprehensive metabolic panel     Status: Abnormal   Collection Time: 10/14/14  2:45 AM  Result Value Ref Range   Sodium 137 135 - 145 mmol/L   Potassium 3.4 (L) 3.5 - 5.1 mmol/L   Chloride 104 96 - 112 mmol/L   CO2 22 19 - 32 mmol/L   Glucose, Bld 105 (H) 70 - 99 mg/dL   BUN 15 6 - 23 mg/dL   Creatinine, Ser 0.97 0.50 - 1.35 mg/dL   Calcium 8.1 (L) 8.4 - 10.5 mg/dL   Total Protein 7.0 6.0 - 8.3 g/dL   Albumin 3.1 (L) 3.5 - 5.2 g/dL   AST 29 0 - 37 U/L   ALT 31 0 - 53 U/L   Alkaline Phosphatase 52 39 - 117 U/L   Total Bilirubin 0.7 0.3 - 1.2 mg/dL   GFR calc non Af Amer >90 >90 mL/min   GFR calc Af Amer >90 >90 mL/min    Comment: (NOTE) The eGFR has been calculated using the CKD EPI equation. This calculation has not been validated in all clinical situations. eGFR's persistently <90 mL/min signify possible Chronic Kidney Disease.    Anion gap 11 5 - 15  CBC     Status: Abnormal   Collection Time: 10/14/14  2:45 AM  Result Value Ref Range   WBC 8.0 4.0 - 10.5 K/uL   RBC 4.09 (L) 4.22 - 5.81 MIL/uL   Hemoglobin 12.5 (L) 13.0 - 17.0 g/dL   HCT 36.7 (L) 39.0 - 52.0 %   MCV 89.7 78.0 - 100.0 fL   MCH 30.6 26.0 - 34.0 pg   MCHC 34.1 30.0 - 36.0 g/dL   RDW 12.6 11.5 - 15.5 %   Platelets 196 150 - 400 K/uL  Protime-INR     Status: Abnormal   Collection Time: 10/14/14  2:45 AM  Result Value Ref Range   Prothrombin Time 15.4 (H) 11.6 - 15.2 seconds   INR 1.20 0.00 - 1.49  CBC     Status: Abnormal   Collection Time: 10/15/14  7:33 AM  Result Value Ref Range   WBC 6.3 4.0 - 10.5 K/uL   RBC 4.30 4.22 - 5.81 MIL/uL   Hemoglobin 12.9 (L) 13.0 - 17.0 g/dL   HCT 39.5 39.0 - 52.0 %   MCV 91.9 78.0 - 100.0 fL   MCH 30.0 26.0 - 34.0 pg   MCHC 32.7 30.0 - 36.0 g/dL   RDW 12.4 11.5 - 15.5 %   Platelets 216 150 - 400 K/uL  Basic metabolic panel     Status: Abnormal   Collection Time: 10/15/14  7:33 AM  Result Value Ref Range   Sodium 138 135 - 145 mmol/L   Potassium 4.2 3.5 -  5.1 mmol/L    Comment: SLIGHT HEMOLYSIS   Chloride 107 96 - 112 mmol/L   CO2 20 19 - 32 mmol/L   Glucose, Bld 78 70 - 99 mg/dL   BUN 15 6 - 23 mg/dL   Creatinine, Ser 0.87 0.50 - 1.35 mg/dL   Calcium 8.3 (L) 8.4 - 10.5 mg/dL   GFR calc non Af Amer >90 >90 mL/min   GFR calc Af Amer >90 >90 mL/min    Comment: (NOTE) The eGFR has been calculated using the CKD EPI equation. This calculation has not been validated in all clinical situations. eGFR's persistently <90 mL/min signify possible Chronic Kidney Disease.    Anion gap 11 5 - 15    Imaging / Studies: Ct Abdomen Pelvis W Contrast  10/13/2014   CLINICAL DATA:  Low abdominal pain and fever.  EXAM: CT ABDOMEN AND PELVIS WITH CONTRAST  TECHNIQUE: Multidetector CT imaging of the abdomen and pelvis was performed using the standard protocol following bolus administration of intravenous contrast.  CONTRAST:  11m OMNIPAQUE IOHEXOL 300 MG/ML  SOLN  COMPARISON:  None.  FINDINGS: BODY WALL: Unremarkable.  LOWER CHEST: Unremarkable.  ABDOMEN/PELVIS:  Liver: Sub cm low-density liver likely reflects cysts.  Biliary: No evidence of biliary obstruction or stone.  Pancreas: Unremarkable.  Spleen: Unremarkable.  Adrenals: Unremarkable.  Kidneys and ureters: No hydronephrosis or stone.  Bladder: Unremarkable.  Reproductive: Unremarkable.  Bowel: Bowel perforation in the low mid abdomen with 4 cm gas and fluid containing abscess that also contains oral contrast. A tract of oral contrast extends towards the sigmoid colon, favoring a diverticular perforation. The appendix base is contiguous with the abscess and is overdistended and avidly enhancing. While the appendix is likely obstructed, the inflammatory changes around the tip and mid portion are not as extensive as expected for primary appendicitis. There is terminal ileitis with circumferential submucosal edema, likely secondary. Bowel encompasses most of the abscess circumference, making percutaneous drainage  difficult.  Retroperitoneum: No mass or adenopathy.  Peritoneum: No ascites or pneumoperitoneum.  Vascular: No acute abnormality.  OSSEOUS: No acute abnormalities.  These results were called by telephone at the time of interpretation on 10/13/2014 at 4:00 pm to Dr. JRia Bush, who verbally acknowledged these results.  IMPRESSION: Contained bowel perforation in the low abdomen with 4 cm abscess. A diverticular source is favored over ruptured appendix, as reasoned above.   Electronically Signed   By: JMonte FantasiaM.D.   On: 10/13/2014 16:04    Medications / Allergies:  Scheduled Meds: . carbamazepine  200 mg Oral TID  . famotidine (PEPCID) IV  20 mg Intravenous Q24H  . heparin  5,000 Units Subcutaneous 3 times per day  . piperacillin-tazobactam (ZOSYN)  IV  3.375 g Intravenous 3 times per day   Continuous Infusions: . sodium chloride 100 mL/hr at 10/14/14 0015   PRN Meds:.morphine injection, ondansetron **OR** ondansetron (ZOFRAN) IV  Antibiotics: Anti-infectives    Start     Dose/Rate Route Frequency Ordered Stop   10/14/14 0600  piperacillin-tazobactam (ZOSYN) IVPB 3.375 g     3.375 g 12.5 mL/hr over 240 Minutes Intravenous 3 times per day 10/14/14 0308     10/13/14 2230  piperacillin-tazobactam (ZOSYN) IVPB 3.375 g     3.375 g 100 mL/hr over 30 Minutes Intravenous  Once 10/13/14 2219 10/13/14 2355        Assessment/Plan Sigmoid diverticulitis with microperforation and abscess vs crohn's terminal ileitis with abscess -continue with zosyn -will consider starting clears -will repeat CT in a few days -heparin/SCDs -mobilize as tolerated  -pain control -will need colonoscopy on OP basis    Erby Pian, ANP-BC [facility] Pager [phone](7A-4:30P) For consults and floor pages call [phone](7A-4:30P)  10/15/2014 9:33 AM

## 2014-10-16 NOTE — Care Management Note (Signed)
CARE MANAGEMENT NOTE 10/16/2014  Patient:  Edward Adkins, Edward Adkins   Account Number:  [id number]  Date Initiated:  10/16/2014  Documentation initiated by:  Alekai Pocock  Subjective/Objective Assessment:   CM following for progression and d/c planning.     Action/Plan:   Plan is to return to home no d/c needs identified.   Anticipated DC Date:  10/18/2014   Anticipated DC Plan:  HOME/SELF CARE         Choice offered to / List presented to:             Status of service:  Completed, signed off Medicare Important Message given?  YES (If response is "NO", the following Medicare IM given date fields will be blank) Date Medicare IM given:  10/16/2014 Medicare IM given by:  Shamus Desantis Date Additional Medicare IM given:   Additional Medicare IM given by:    Discharge Disposition:  HOME/SELF CARE  Per UR Regulation:    If discussed at Long Length of Stay Meetings, dates discussed:    Comments:

## 2014-10-16 NOTE — Progress Notes (Signed)
Patient ID: Edward Adkins, male   DOB: 1971/01/26, 44 y.o.   MRN: [id number]     [facility]      [address] [place], [place] [address]    Phone: [phone] FAX: [phone]     Subjective: Denies pain.  Having loose stools.  No n/v. Afebrile.    Objective:  Vital signs:  Filed Vitals:   10/15/14 1100 10/15/14 1700 10/15/14 2146 10/16/14 0544  BP: 105/75 109/66 116/71 107/71  Pulse: 65 81 71 66  Temp: 98.7 F (37.1 C) 97.4 F (36.3 C) 98.3 F (36.8 C) 98.5 F (36.9 C)  TempSrc: Oral Oral Oral Oral  Resp: 16 18 17 17   Height:   6' (1.829 m)   Weight:   216 lb 12.8 oz (98.34 kg)   SpO2: 100% 100% 100% 100%    Last BM Date: 10/14/14  Intake/Output   Yesterday:  02/11 0701 - 02/12 0700 In: 360 [P.O.:360] Out: -  This shift:      Physical Exam: General: Pt awake/alert/oriented x4 in no acute distress  Abdomen: Soft. Nondistended. Non tender. No evidence of peritonitis. No incarcerated hernias.   Problem List:   Principal Problem:   Diverticulitis of intestine with perforation Active Problems:   Complex partial seizures   Hyperlipidemia   Cerebral palsy    Results:   Labs: Results for orders placed or performed during the hospital encounter of 10/13/14 (from the past 48 hour(s))  CBC     Status: Abnormal   Collection Time: 10/15/14  7:33 AM  Result Value Ref Range   WBC 6.3 4.0 - 10.5 K/uL   RBC 4.30 4.22 - 5.81 MIL/uL   Hemoglobin 12.9 (L) 13.0 - 17.0 g/dL   HCT 39.5 39.0 - 52.0 %   MCV 91.9 78.0 - 100.0 fL   MCH 30.0 26.0 - 34.0 pg   MCHC 32.7 30.0 - 36.0 g/dL   RDW 12.4 11.5 - 15.5 %   Platelets 216 150 - 400 K/uL  Basic metabolic panel     Status: Abnormal   Collection Time: 10/15/14  7:33 AM  Result Value Ref Range   Sodium 138 135 - 145 mmol/L   Potassium 4.2 3.5 - 5.1 mmol/L    Comment: SLIGHT HEMOLYSIS   Chloride 107 96 - 112 mmol/L   CO2 20 19 - 32 mmol/L   Glucose, Bld 78  70 - 99 mg/dL   BUN 15 6 - 23 mg/dL   Creatinine, Ser 0.87 0.50 - 1.35 mg/dL   Calcium 8.3 (L) 8.4 - 10.5 mg/dL   GFR calc non Af Amer >90 >90 mL/min   GFR calc Af Amer >90 >90 mL/min    Comment: (NOTE) The eGFR has been calculated using the CKD EPI equation. This calculation has not been validated in all clinical situations. eGFR's persistently <90 mL/min signify possible Chronic Kidney Disease.    Anion gap 11 5 - 15    Imaging / Studies: No results found.  Medications / Allergies:  Scheduled Meds: . carbamazepine  200 mg Oral TID  . famotidine (PEPCID) IV  20 mg Intravenous Q24H  . heparin  5,000 Units Subcutaneous 3 times per day  . piperacillin-tazobactam (ZOSYN)  IV  3.375 g Intravenous 3 times per day   Continuous Infusions: . sodium chloride 100 mL/hr at 10/16/14 0614   PRN Meds:.morphine injection, ondansetron **OR** ondansetron (ZOFRAN) IV  Antibiotics: Anti-infectives    Start  Dose/Rate Route Frequency Ordered Stop   10/14/14 0600  piperacillin-tazobactam (ZOSYN) IVPB 3.375 g     3.375 g 12.5 mL/hr over 240 Minutes Intravenous 3 times per day 10/14/14 0308     10/13/14 2230  piperacillin-tazobactam (ZOSYN) IVPB 3.375 g     3.375 g 100 mL/hr over 30 Minutes Intravenous  Once 10/13/14 2219 10/13/14 2355        Assessment/Plan Sigmoid diverticulitis with microperforation and abscess vs crohn's terminal ileitis with abscess -continue with zosyn -tolerating clears, non tender, afebrile.  -will repeat CT on Sunday  -heparin/SCDs -mobilize as tolerated  -pain control -will need colonoscopy on OP basis   Erby Pian, ANP-BC [facility] Pager [phone](7A-4:30P) For consults and floor pages call [phone](7A-4:30P)  10/16/2014 10:01 AM

## 2014-10-16 NOTE — Progress Notes (Addendum)
[facility] PROGRESS NOTE  MACHAEL RAINE DTO:[id number] DOB: [birth year] DOA: 10/13/2014 PCP: [name], MD  Brief Narrative: Edward Adkins is a 44 y.o. male with Past medical history of cerebral palsy with complex partial seizure who presented with complaints of abdominal pain and was found to have acute sigmoid diverticulitis with perforation and small diverticular abscess of 4 cm in diameter. CT abdomen and pelvis showed "Contained bowel perforation in the low abdomen with 4 cm abscess. A diverticular source is favored over ruptured appendix, as reasoned above". He is on Zosyn and his white count has improved from 17,700 to 8000. Interventional radiology felt abscess not amenable to drainage at this point. Will continue current antibiotics and follow general surgery recommendations  Assessment/Plan: Diverticulitis of intestine with perforation  Continue Zosyn  Follow general surgery recommendations  Agree with mobilizing as tolerated  Currently on clear liquid diet, will not advance until okay with surgery, repeat CT scan on Sunday Discontinue IV fluids as patient's liquid diet intake is adequate  Complex partial seizures/Hyperlipidemia/Cerebral palsy  Stable continue home regimen.   DVT prophylaxis-heparin    Code Status: Full Family Communication: No family at bedside  Disposition Plan: Anticipate discharge in one to 2 days    Consultants:  General surgery  Procedures:  None  Antibiotics:  Zosyn  HPI/Subjective: Mild increase in pain after the patient started clear liquids according to the patient's father Complaining of increased urinary frequency  Objective: Filed Vitals:   10/16/14 1019  BP: 103/65  Pulse: 74  Temp: 98.9 F (37.2 C)  Resp: 17    Intake/Output Summary (Last 24 hours) at 10/16/14 1507 Last data filed at 10/16/14 1033  Gross per 24 hour  Intake    600 ml  Output      0 ml  Net    600 ml   Filed Weights   10/13/14  1911 10/14/14 2047 10/15/14 2146  Weight: 100.699 kg (222 lb) 101 kg (222 lb 10.6 oz) 98.34 kg (216 lb 12.8 oz)    Exam:   General:  Patient in no acute distress, alert and awake  Cardiovascular: Rate and rhythm, no murmurs or rubs  Respiratory: Here to auscultation bilaterally, equal chest rise, no wheezes  Abdomen: Soft, nondistended, mild tenderness with deep palpation in lower quadrants  Musculoskeletal: No cyanosis or clubbing   Data Reviewed: Basic Metabolic Panel:  Recent Labs Lab 10/12/14 1200 10/13/14 1917 10/14/14 0245 10/15/14 0733  NA 134* 134* 137 138  K 3.9 4.0 3.4* 4.2  CL 98 99 104 107  CO2 25 24 22 20   GLUCOSE 97 112* 105* 78  BUN 17 18 15 15   CREATININE 0.99 1.04 0.97 0.87  CALCIUM 9.4 8.6 8.1* 8.3*   Liver Function Tests:  Recent Labs Lab 10/12/14 1200 10/13/14 1917 10/14/14 0245  AST 12 33 29  ALT 17 28 31   ALKPHOS 56 61 52  BILITOT 0.8 0.8 0.7  PROT 8.7* 8.2 7.0  ALBUMIN 4.4 3.7 3.1*    Recent Labs Lab 10/12/14 1200 10/13/14 1917  LIPASE 5.0* 24   No results for input(s): AMMONIA in the last 168 hours. CBC:  Recent Labs Lab 10/12/14 1200 10/13/14 1917 10/14/14 0245 10/15/14 0733  WBC 17.7* 10.9* 8.0 6.3  NEUTROABS 14.8* 8.3*  --   --   HGB 15.4 14.1 12.5* 12.9*  HCT 45.3 41.0 36.7* 39.5  MCV 90.3 89.1 89.7 91.9  PLT 213.0 224 196 216   Cardiac Enzymes: No results for input(s):  CKTOTAL, CKMB, CKMBINDEX, TROPONINI in the last 168 hours. BNP (last 3 results) No results for input(s): BNP in the last 8760 hours.  ProBNP (last 3 results) No results for input(s): PROBNP in the last 8760 hours.  CBG: No results for input(s): GLUCAP in the last 168 hours.  No results found for this or any previous visit (from the past 240 hour(s)).   Studies: No results found.  Scheduled Meds: . carbamazepine  200 mg Oral TID  . famotidine (PEPCID) IV  20 mg Intravenous Q24H  . heparin  5,000 Units Subcutaneous 3 times per day  .  piperacillin-tazobactam (ZOSYN)  IV  3.375 g Intravenous 3 times per day   Continuous Infusions: . sodium chloride 100 mL/hr at 10/16/14 2683    Principal Problem:   Diverticulitis of intestine with perforation Active Problems:   Complex partial seizures   Hyperlipidemia   Cerebral palsy    Time spent: > 35 minutes    [facility] Pager [phone]. If 7PM-7AM, please contact night-coverage at [email or web address], password [facility] 10/16/2014, 3:07 PM  LOS: 2 days

## 2014-10-17 DIAGNOSIS — K5720 Diverticulitis of large intestine with perforation and abscess without bleeding: Principal | ICD-10-CM

## 2014-10-17 DIAGNOSIS — L0291 Cutaneous abscess, unspecified: Secondary | ICD-10-CM

## 2014-10-17 DIAGNOSIS — G809 Cerebral palsy, unspecified: Secondary | ICD-10-CM

## 2014-10-17 LAB — CBC
HCT: 40.6 % (ref 39.0–52.0)
HEMOGLOBIN: 13.3 g/dL (ref 13.0–17.0)
MCH: 30.2 pg (ref 26.0–34.0)
MCHC: 32.8 g/dL (ref 30.0–36.0)
MCV: 92.3 fL (ref 78.0–100.0)
Platelets: 244 10*3/uL (ref 150–400)
RBC: 4.4 MIL/uL (ref 4.22–5.81)
RDW: 12.6 % (ref 11.5–15.5)
WBC: 9.4 10*3/uL (ref 4.0–10.5)

## 2014-10-17 LAB — COMPREHENSIVE METABOLIC PANEL
ALT: 19 U/L (ref 0–53)
AST: 14 U/L (ref 0–37)
Albumin: 3 g/dL — ABNORMAL LOW (ref 3.5–5.2)
Alkaline Phosphatase: 57 U/L (ref 39–117)
Anion gap: 9 (ref 5–15)
BUN: <5 mg/dL — ABNORMAL LOW (ref 6–23)
CO2: 25 mmol/L (ref 19–32)
Calcium: 8.3 mg/dL — ABNORMAL LOW (ref 8.4–10.5)
Chloride: 104 mmol/L (ref 96–112)
Creatinine, Ser: 1 mg/dL (ref 0.50–1.35)
GFR calc Af Amer: >90 mL/min (ref >90)
GFR calc non Af Amer: >90 mL/min (ref >90)
Glucose, Bld: 94 mg/dL (ref 70–99)
Potassium: 4.1 mmol/L (ref 3.5–5.1)
SODIUM: 138 mmol/L (ref 135–145)
Total Bilirubin: 0.5 mg/dL (ref 0.3–1.2)
Total Protein: 7.1 g/dL (ref 6.0–8.3)

## 2014-10-17 NOTE — Progress Notes (Signed)
[facility] PROGRESS NOTE  Edward Adkins JJO:[id number] DOB: 1970-10-10 DOA: 10/13/2014 PCP: [name], MD  Brief Narrative: Edward Adkins is a 44 y.o. male with Past medical history of cerebral palsy with complex partial seizure who presented with complaints of abdominal pain and was found to have acute sigmoid diverticulitis with perforation and small diverticular abscess of 4 cm in diameter. CT abdomen and pelvis showed "Contained bowel perforation in the low abdomen with 4 cm abscess. A diverticular source is favored over ruptured appendix, as reasoned above". He is on Zosyn and his white count has improved from 17,700 to 8000. Interventional radiology felt abscess not amenable to drainage at this point. Will continue current antibiotics and follow general surgery recommendations  Assessment/Plan: Diverticulitis of intestine with perforation  Continue Zosyn  general surgery  Advanced diet.   Agree with mobilizing as tolerated   repeat CT scan on Sunday WBC normalised  Complex partial seizures/Hyperlipidemia/Cerebral palsy  Stable continue home regimen.   DVT prophylaxis-heparin    Code Status: Full Family Communication: No family at bedside  Disposition Plan: Anticipate discharge in one to 2 days    Consultants:  General surgery  Procedures:  None  Antibiotics:  Zosyn  HPI/Subjective: Mild increase in pain after the patient started clear liquids according to the patient's father Complaining of increased urinary frequency  Objective: Filed Vitals:   10/17/14 0849  BP: 104/58  Pulse: 75  Temp: 98.1 F (36.7 C)  Resp: 18    Intake/Output Summary (Last 24 hours) at 10/17/14 1732 Last data filed at 10/17/14 1330  Gross per 24 hour  Intake    960 ml  Output      0 ml  Net    960 ml   Filed Weights   10/14/14 2047 10/15/14 2146 10/16/14 2034  Weight: 101 kg (222 lb 10.6 oz) 98.34 kg (216 lb 12.8 oz) 97.07 kg (214 lb)     Exam:   General:  Patient in no acute distress, alert and awake  Cardiovascular: Rate and rhythm, no murmurs or rubs  Respiratory: Here to auscultation bilaterally, equal chest rise, no wheezes  Abdomen: Soft, nondistended, mild tenderness with deep palpation in lt lower quadrants  Musculoskeletal: No cyanosis or clubbing   Data Reviewed: Basic Metabolic Panel:  Recent Labs Lab 10/12/14 1200 10/13/14 1917 10/14/14 0245 10/15/14 0733 10/17/14 0445  NA 134* 134* 137 138 138  K 3.9 4.0 3.4* 4.2 4.1  CL 98 99 104 107 104  CO2 25 24 22 20 25   GLUCOSE 97 112* 105* 78 94  BUN 17 18 15 15  <5*  CREATININE 0.99 1.04 0.97 0.87 1.00  CALCIUM 9.4 8.6 8.1* 8.3* 8.3*   Liver Function Tests:  Recent Labs Lab 10/12/14 1200 10/13/14 1917 10/14/14 0245 10/17/14 0445  AST 12 33 29 14  ALT 17 28 31 19   ALKPHOS 56 61 52 57  BILITOT 0.8 0.8 0.7 0.5  PROT 8.7* 8.2 7.0 7.1  ALBUMIN 4.4 3.7 3.1* 3.0*    Recent Labs Lab 10/12/14 1200 10/13/14 1917  LIPASE 5.0* 24   No results for input(s): AMMONIA in the last 168 hours. CBC:  Recent Labs Lab 10/12/14 1200 10/13/14 1917 10/14/14 0245 10/15/14 0733 10/17/14 0445  WBC 17.7* 10.9* 8.0 6.3 9.4  NEUTROABS 14.8* 8.3*  --   --   --   HGB 15.4 14.1 12.5* 12.9* 13.3  HCT 45.3 41.0 36.7* 39.5 40.6  MCV 90.3 89.1 89.7 91.9 92.3  PLT 213.0  224 196 216 244   Cardiac Enzymes: No results for input(s): CKTOTAL, CKMB, CKMBINDEX, TROPONINI in the last 168 hours. BNP (last 3 results) No results for input(s): BNP in the last 8760 hours.  ProBNP (last 3 results) No results for input(s): PROBNP in the last 8760 hours.  CBG: No results for input(s): GLUCAP in the last 168 hours.  No results found for this or any previous visit (from the past 240 hour(s)).   Studies: No results found.  Scheduled Meds: . carbamazepine  200 mg Oral TID  . famotidine (PEPCID) IV  20 mg Intravenous Q24H  . heparin  5,000 Units Subcutaneous 3  times per day  . piperacillin-tazobactam (ZOSYN)  IV  3.375 g Intravenous 3 times per day   Continuous Infusions: . sodium chloride 10 mL/hr at 10/16/14 1534    Principal Problem:   Diverticulitis of intestine with perforation Active Problems:   Complex partial seizures   Hyperlipidemia   Cerebral palsy    Time spent: > 35 minutes    [facility] Pager [phone]. If 7PM-7AM, please contact night-coverage at [email or web address], password [facility] 10/17/2014, 5:32 PM  LOS: 3 days

## 2014-10-17 NOTE — Progress Notes (Signed)
Patient ID: Edward Adkins, male   DOB: November 19, 1970, 44 y.o.   MRN: [id number]     [facility]      [address] [place], [place] [address]    Phone: [phone] FAX: [phone]     Subjective: Sometimes has shooting pain.  Having BMs.  Tolerating clears.  VSS.  Afebrile.    Objective:  Vital signs:  Filed Vitals:   10/16/14 1758 10/16/14 2034 10/17/14 0512 10/17/14 0849  BP: 98/63 114/73 112/66 104/58  Pulse: 83 80 72 75  Temp: 98.7 F (37.1 C) 98.5 F (36.9 C) 97.8 F (36.6 C) 98.1 F (36.7 C)  TempSrc: Oral   Oral  Resp: _0 Height:      Weight:  214 lb (97.07 kg)    SpO2: 100% 100% 97% 99%    Last BM Date: 10/14/14  Intake/Output   Yesterday:  02/12 0701 - 02/13 0700 In: 720 [P.O.:720] Out: -  This shift:  Total I/O In: 360 [P.O.:360] Out: -   Physical Exam: General: Pt awake/alert/oriented x4 in no acute distress  Abdomen: Soft. Nondistended. Non tender. No evidence of peritonitis. No incarcerated hernias.   Problem List:   Principal Problem:   Diverticulitis of intestine with perforation Active Problems:   Complex partial seizures   Hyperlipidemia   Cerebral palsy    Results:   Labs: Results for orders placed or performed during the hospital encounter of 10/13/14 (from the past 48 hour(s))  CBC     Status: None   Collection Time: 10/17/14  4:45 AM  Result Value Ref Range   WBC 9.4 4.0 - 10.5 K/uL   RBC 4.40 4.22 - 5.81 MIL/uL   Hemoglobin 13.3 13.0 - 17.0 g/dL   HCT 40.6 39.0 - 52.0 %   MCV 92.3 78.0 - 100.0 fL   MCH 30.2 26.0 - 34.0 pg   MCHC 32.8 30.0 - 36.0 g/dL   RDW 12.6 11.5 - 15.5 %   Platelets 244 150 - 400 K/uL  Comprehensive metabolic panel     Status: Abnormal   Collection Time: 10/17/14  4:45 AM  Result Value Ref Range   Sodium 138 135 - 145 mmol/L   Potassium 4.1 3.5 - 5.1 mmol/L   Chloride 104 96 - 112 mmol/L   CO2 25 19 - 32 mmol/L   Glucose, Bld 94 70  - 99 mg/dL   BUN <5 (L) 6 - 23 mg/dL   Creatinine, Ser 1.00 0.50 - 1.35 mg/dL   Calcium 8.3 (L) 8.4 - 10.5 mg/dL   Total Protein 7.1 6.0 - 8.3 g/dL   Albumin 3.0 (L) 3.5 - 5.2 g/dL   AST 14 0 - 37 U/L   ALT 19 0 - 53 U/L   Alkaline Phosphatase 57 39 - 117 U/L   Total Bilirubin 0.5 0.3 - 1.2 mg/dL   GFR calc non Af Amer >90 >90 mL/min   GFR calc Af Amer >90 >90 mL/min    Comment: (NOTE) The eGFR has been calculated using the CKD EPI equation. This calculation has not been validated in all clinical situations. eGFR's persistently <90 mL/min signify possible Chronic Kidney Disease.    Anion gap 9 5 - 15    Imaging / Studies: No results found.  Medications / Allergies:  Scheduled Meds: . carbamazepine  200 mg Oral TID  . famotidine (PEPCID) IV  20 mg Intravenous Q24H  . heparin  5,000 Units Subcutaneous  3 times per day  . piperacillin-tazobactam (ZOSYN)  IV  3.375 g Intravenous 3 times per day   Continuous Infusions: . sodium chloride 10 mL/hr at 10/16/14 1534   PRN Meds:.morphine injection, ondansetron **OR** ondansetron (ZOFRAN) IV  Antibiotics: Anti-infectives    Start     Dose/Rate Route Frequency Ordered Stop   10/14/14 0600  piperacillin-tazobactam (ZOSYN) IVPB 3.375 g     3.375 g 12.5 mL/hr over 240 Minutes Intravenous 3 times per day 10/14/14 0308     10/13/14 2230  piperacillin-tazobactam (ZOSYN) IVPB 3.375 g     3.375 g 100 mL/hr over 30 Minutes Intravenous  Once 10/13/14 2219 10/13/14 2355      Assessment/Plan Sigmoid diverticulitis with microperforation and abscess vs crohn's terminal ileitis with abscess -continue with zosyn D#3 -tolerating clears, non tender, afebrile.  -advance to fulls -will repeat CT tomorrow -heparin/SCDs -mobilize as tolerated  -pain control -will need colonoscopy on OP basis   Erby Pian, [facility] Surgery Pager [phone] For consults and floor pages call [phone]  10/17/2014 10:16 AM

## 2014-10-18 ENCOUNTER — Inpatient Hospital Stay (HOSPITAL_COMMUNITY): Payer: Medicare Other

## 2014-10-18 ENCOUNTER — Encounter (HOSPITAL_COMMUNITY): Payer: Self-pay | Admitting: Radiology

## 2014-10-18 DIAGNOSIS — K651 Peritoneal abscess: Secondary | ICD-10-CM

## 2014-10-18 MED ORDER — IOHEXOL 300 MG/ML  SOLN
25.0000 mL | INTRAMUSCULAR | Status: AC
  Administered 2014-10-18 (×2): 25 mL via ORAL

## 2014-10-18 MED ORDER — IOHEXOL 300 MG/ML  SOLN
100.0000 mL | Freq: Once | INTRAMUSCULAR | Status: AC | PRN
  Administered 2014-10-18: 100 mL via INTRAVENOUS

## 2014-10-18 NOTE — Progress Notes (Signed)
[facility] PROGRESS NOTE  Edward Adkins UXN:[id number] DOB: Dec 28, 1970 DOA: 10/13/2014 PCP: [name], MD  Brief Narrative: Edward Adkins is a 44 y.o. male with Past medical history of cerebral palsy with complex partial seizure who presented with complaints of abdominal pain and was found to have acute sigmoid diverticulitis with perforation and small diverticular abscess of 4 cm in diameter. CT abdomen and pelvis showed "Contained bowel perforation in the low abdomen with 4 cm abscess. A diverticular source is favored over ruptured appendix, as reasoned above". He is on Zosyn and his white count has improved from 17,700 to 8000. Interventional radiology felt abscess not amenable to drainage at this point. Will continue current antibiotics and follow general surgery recommendations  Assessment/Plan: Diverticulitis of intestine with perforation ==Continue Zosyn ==general surgery  Advanced diet.  ==Agree with mobilizing as tolerated ==repeat CT scan 2/14 showed worsening of the abscess. Plan for IR drain 2/15 ==WBC normalised. ==NPO PMN  Complex partial seizures/Hyperlipidemia/Cerebral palsy  Stable continue home regimen.   DVT prophylaxis-heparin    Code Status: Full Family Communication: No family at bedside  Disposition Plan: Anticipate discharge in one to 2 days    Consultants:  General surgery  Procedures:  None  Antibiotics:  Zosyn  HPI/Subjective: Mild increase in pain after the patient started clear liquids according to the patient's father Complaining of increased urinary frequency  Objective: Filed Vitals:   10/18/14 1715  BP: 121/75  Pulse: 74  Temp: 98.6 F (37 C)  Resp: 18    Intake/Output Summary (Last 24 hours) at 10/18/14 1753 Last data filed at 10/18/14 1501  Gross per 24 hour  Intake    490 ml  Output      0 ml  Net    490 ml   Filed Weights   10/15/14 2146 10/16/14 2034 10/17/14 2031  Weight: 98.34 kg (216 lb 12.8 oz)  97.07 kg (214 lb) 97.07 kg (214 lb)    Exam:   General:  Patient in no acute distress, alert and awake  Cardiovascular: Rate and rhythm, no murmurs or rubs  Respiratory: Here to auscultation bilaterally, equal chest rise, no wheezes  Abdomen: Soft, nondistended, mild tenderness with deep palpation in lt lower quadrants  Musculoskeletal: No cyanosis or clubbing   Data Reviewed: Basic Metabolic Panel:  Recent Labs Lab 10/12/14 1200 10/13/14 1917 10/14/14 0245 10/15/14 0733 10/17/14 0445  NA 134* 134* 137 138 138  K 3.9 4.0 3.4* 4.2 4.1  CL 98 99 104 107 104  CO2 25 24 22 20 25   GLUCOSE 97 112* 105* 78 94  BUN 17 18 15 15  <5*  CREATININE 0.99 1.04 0.97 0.87 1.00  CALCIUM 9.4 8.6 8.1* 8.3* 8.3*   Liver Function Tests:  Recent Labs Lab 10/12/14 1200 10/13/14 1917 10/14/14 0245 10/17/14 0445  AST 12 33 29 14  ALT 17 28 31 19   ALKPHOS 56 61 52 57  BILITOT 0.8 0.8 0.7 0.5  PROT 8.7* 8.2 7.0 7.1  ALBUMIN 4.4 3.7 3.1* 3.0*    Recent Labs Lab 10/12/14 1200 10/13/14 1917  LIPASE 5.0* 24   No results for input(s): AMMONIA in the last 168 hours. CBC:  Recent Labs Lab 10/12/14 1200 10/13/14 1917 10/14/14 0245 10/15/14 0733 10/17/14 0445  WBC 17.7* 10.9* 8.0 6.3 9.4  NEUTROABS 14.8* 8.3*  --   --   --   HGB 15.4 14.1 12.5* 12.9* 13.3  HCT 45.3 41.0 36.7* 39.5 40.6  MCV 90.3 89.1 89.7 91.9  92.3  PLT 213.0 224 196 216 244   Cardiac Enzymes: No results for input(s): CKTOTAL, CKMB, CKMBINDEX, TROPONINI in the last 168 hours. BNP (last 3 results) No results for input(s): BNP in the last 8760 hours.  ProBNP (last 3 results) No results for input(s): PROBNP in the last 8760 hours.  CBG: No results for input(s): GLUCAP in the last 168 hours.  No results found for this or any previous visit (from the past 240 hour(s)).   Studies: Ct Abdomen Pelvis W Contrast  10/18/2014   CLINICAL DATA:  Abdominal pain. Umbilical area with fever. Pain on left side of the  abdomen. Pain started 10/10/2014  EXAM: CT ABDOMEN AND PELVIS WITH CONTRAST  TECHNIQUE: Multidetector CT imaging of the abdomen and pelvis was performed using the standard protocol following bolus administration of intravenous contrast.  CONTRAST:  154mL OMNIPAQUE IOHEXOL 300 MG/ML  SOLN  COMPARISON:  10/13/2014  FINDINGS: Lower chest: The lung bases are clear.  Heart size is normal.  Upper abdomen: There is a small amount of free intraperitoneal air. This is a new finding since the prior study. Stable small low-attenuation lesions are identified within the liver, likely representing cyst. No focal abnormality identified within the spleen, pancreas, or adrenal glands. The kidneys have a normal appearance. Gallbladder is present and is collapsed.  Gastrointestinal tract: The stomach has a normal appearance. Within the lower central abdomen there is an abscess measuring 5.8 x 4.3 cm. The previously this measured 3.6 cm. There is significant surrounding mesenteric inflammation. The terminal ileum is immediately anterior to this abscess and demonstrates a thickened wall. The base of the appendix is immediately adjacent to the abscess. The appendix is distended. However there is little inflammation around the distal tip of the appendix and the appearance is favored to represent a secondary process. The sigmoid colon continues is show significant inflammation as well as numerous diverticula. On the previous exam contrast extended towards the abscess from the sigmoid colon, suggesting a diverticular abscess.  Pelvis: Urinary bladder has a normal appearance. There is no free pelvic fluid. Significant pelvic mesentery inflammatory change.  Retroperitoneum: Small retroperitoneal and mesenteric lymph nodes are likely reactive. Small locules of gas throughout the mesentery.  Abdominal wall: Unremarkable.  Osseous structures: Unremarkable.  IMPRESSION: 1. Abscess within the lower central abdomen has increased in size, 5.8 cm in  diameter. 2. Interval perforation with small locules of gas seen throughout the mesentery and within the upper abdomen. 3. Thickened terminal ileum and distended appendix, felt to be secondary to the abscess. 4. Significant sigmoid diverticular disease, likely the source of the abscess. 5. The salient findings were discussed with Dr. Lunette Stands on 10/18/2014 at 10:24 am.   Electronically Signed   By: Nolon Nations M.D.   On: 10/18/2014 10:25    Scheduled Meds: . carbamazepine  200 mg Oral TID  . famotidine (PEPCID) IV  20 mg Intravenous Q24H  . heparin  5,000 Units Subcutaneous 3 times per day  . piperacillin-tazobactam (ZOSYN)  IV  3.375 g Intravenous 3 times per day   Continuous Infusions: . sodium chloride 10 mL/hr at 10/16/14 1534    Principal Problem:   Diverticulitis of intestine with perforation Active Problems:   Complex partial seizures   Hyperlipidemia   Cerebral palsy   Diverticulitis large intestine    Time spent: > 35 minutes    [facility] Pager [phone]. If 7PM-7AM, please contact night-coverage at [email or web address], password [facility] 10/18/2014, 5:53 PM  LOS: 4  days

## 2014-10-18 NOTE — Progress Notes (Signed)
  Subjective: Comfortable Minimal pain  Objective: Vital signs in last 24 hours: Temp:  [97.8 F (36.6 C)-98.5 F (36.9 C)] 98.1 F (36.7 C) (02/14 0459) Pulse Rate:  [70-87] 70 (02/14 0459) Resp:  [17-18] 17 (02/14 0459) BP: (104-119)/(58-75) 108/67 mmHg (02/14 0459) SpO2:  [99 %-100 %] 99 % (02/14 0459) Weight:  [214 lb (97.07 kg)] 214 lb (97.07 kg) (02/13 2031) Last BM Date: 10/14/14  Intake/Output from previous day: 02/13 0701 - 02/14 0700 In: 980 [P.O.:980] Out: -  Intake/Output this shift:   Abdomen soft with minimal LLQ tenderness  Lab Results:   Recent Labs  10/17/14 0445  WBC 9.4  HGB 13.3  HCT 40.6  PLT 244   BMET  Recent Labs  10/17/14 0445  NA 138  K 4.1  CL 104  CO2 25  GLUCOSE 94  BUN <5*  CREATININE 1.00  CALCIUM 8.3*   PT/INR No results for input(s): LABPROT, INR in the last 72 hours. ABG No results for input(s): PHART, HCO3 in the last 72 hours.  Invalid input(s): PCO2, PO2  Studies/Results: No results found.  Anti-infectives: Anti-infectives    Start     Dose/Rate Route Frequency Ordered Stop   10/14/14 0600  piperacillin-tazobactam (ZOSYN) IVPB 3.375 g     3.375 g 12.5 mL/hr over 240 Minutes Intravenous 3 times per day 10/14/14 0308     10/13/14 2230  piperacillin-tazobactam (ZOSYN) IVPB 3.375 g     3.375 g 100 mL/hr over 30 Minutes Intravenous  Once 10/13/14 2219 10/13/14 2355      Assessment/Plan:  Diverticulitis with abscess  Plan CT today to see if things are improving vs need for perc drain  LOS: 4 days    Edward Adkins A 10/18/2014

## 2014-10-18 NOTE — Progress Notes (Signed)
Patient ID: Edward Adkins, male   DOB: 18-Feb-1971, 44 y.o.   MRN: [id number]  CT scan reviewed.  Patient's exam still fairly benign. Will have IR re-evaluate patient in the morning.  They still may not be able to drain the abscess in which case the patient will need a probable exp lap with colostomy

## 2014-10-19 ENCOUNTER — Encounter (HOSPITAL_COMMUNITY): Payer: Self-pay | Admitting: Radiology

## 2014-10-19 ENCOUNTER — Inpatient Hospital Stay (HOSPITAL_COMMUNITY): Payer: Medicare Other

## 2014-10-19 MED ORDER — MIDAZOLAM HCL 2 MG/2ML IJ SOLN
INTRAMUSCULAR | Status: AC
  Filled 2014-10-19: qty 4

## 2014-10-19 MED ORDER — FENTANYL CITRATE 0.05 MG/ML IJ SOLN
INTRAMUSCULAR | Status: AC
  Filled 2014-10-19: qty 4

## 2014-10-19 MED ORDER — LIDOCAINE HCL 1 % IJ SOLN
INTRAMUSCULAR | Status: AC
  Filled 2014-10-19: qty 20

## 2014-10-19 MED ORDER — SODIUM CHLORIDE 0.9 % IV SOLN
INTRAVENOUS | Status: AC | PRN
  Administered 2014-10-19: 10 mL/h via INTRAVENOUS

## 2014-10-19 MED ORDER — FENTANYL CITRATE 0.05 MG/ML IJ SOLN
INTRAMUSCULAR | Status: AC | PRN
  Administered 2014-10-19: 25 ug via INTRAVENOUS
  Administered 2014-10-19 (×2): 50 ug via INTRAVENOUS
  Administered 2014-10-19: 25 ug via INTRAVENOUS

## 2014-10-19 MED ORDER — MIDAZOLAM HCL 2 MG/2ML IJ SOLN
INTRAMUSCULAR | Status: AC | PRN
  Administered 2014-10-19: 0.5 mg via INTRAVENOUS
  Administered 2014-10-19 (×2): 1 mg via INTRAVENOUS
  Administered 2014-10-19: 0.5 mg via INTRAVENOUS

## 2014-10-19 MED ORDER — HEPARIN SODIUM (PORCINE) 5000 UNIT/ML IJ SOLN
5000.0000 [IU] | Freq: Three times a day (TID) | INTRAMUSCULAR | Status: DC
  Administered 2014-10-19 – 2014-10-22 (×9): 5000 [IU] via SUBCUTANEOUS
  Filled 2014-10-19 (×10): qty 1

## 2014-10-19 MED ORDER — HYDROCODONE-ACETAMINOPHEN 5-325 MG PO TABS
1.0000 | ORAL_TABLET | Freq: Four times a day (QID) | ORAL | Status: DC | PRN
  Administered 2014-10-20: 1 via ORAL
  Filled 2014-10-19: qty 1

## 2014-10-19 NOTE — Sedation Documentation (Signed)
Report given to [facility] on 6E

## 2014-10-19 NOTE — Progress Notes (Signed)
Patient ID: Edward Adkins, male   DOB: [birth year], 44 y.o.   MRN: [id number]  [facility] PROGRESS NOTE  MYLIN HIRANO BZJ:[id number] DOB: 19-May-1971 DOA: 10/13/2014 PCP: [name], MD   Brief narrative:    44 y.o. male with cerebral palsy with complex partial seizure who presented with complaints of abdominal pain and was found to have acute sigmoid diverticulitis with perforation and small diverticular abscess 4 cm in diameter. CT abdomen and pelvis notable for Contained bowel perforation in the low abdomen with 4 cm abscess. A diverticular source is favored over ruptured appendix. Pt started on Zosyn and WBC improved from 17 K --> 8 K. Interventional radiology initially felt abscess not amenable to drainage but repeat CT abd with increasing size of the abscess. Surgery consulted for assistance.   Assessment/Plan:    Diverticulitis of intestine with perforation  Pt reports no pain this am and is clinically stable   Repeat CT abdomen with increasing abscess size   IR re consulted for drain placement, appreciate assistance   Continue Zosyn day #6  Complex partial seizures/Hyperlipidemia/Cerebral palsy  Stable continue home regimen with carbamazepine   No seizures while hospitalized   Heparin SQ for DVT prophylaxis   Code Status: Full.  Family Communication:  plan of care discussed with the patient Disposition Plan: Plan for IR to place drain 2/15, d/c plan to be determined based on post op course   IV access:  Peripheral IV  Procedures and diagnostic studies:    Ct Abdomen Pelvis W Contrast  10/18/2014  Abscess within the lower central abdomen has increased in size, 5.8 cm in diameter. 2. Interval perforation with small locules of gas seen throughout the mesentery and within the upper abdomen. 3. Thickened terminal ileum and distended appendix, felt to be secondary to the abscess. 4. Significant sigmoid diverticular disease, likely the source of the abscess  Ct  Abdomen Pelvis W Contrast  10/13/2014  Contained bowel perforation in the low abdomen with 4 cm abscess. A diverticular source is favored over ruptured appendix.  Medical Consultants:  Surgery Interventional radiology   Other Consultants:  None  IAnti-Infectives:   Zosyn 2/10 -->   Edward Ramsay, MD  [facility] Pager [phone]  If 7PM-7AM, please contact night-coverage [email or web address] Password [facility] 10/19/2014, 12:45 PM   LOS: 5 days   HPI/Subjective: No events overnight.   Objective: Filed Vitals:   10/18/14 1715 10/18/14 1958 10/19/14 0418 10/19/14 0946  BP: 121/75 114/70 104/64 116/74  Pulse: 74 70 70 66  Temp: 98.6 F (37 C) 98.7 F (37.1 C) 97.9 F (36.6 C) 98 F (36.7 C)  TempSrc: Oral   Oral  Resp: 18 18 18 18   Height:      Weight:  97.07 kg (214 lb)    SpO2: 99% 99% 100% 98%    Intake/Output Summary (Last 24 hours) at 10/19/14 1245 Last data filed at 10/18/14 1844  Gross per 24 hour  Intake    845 ml  Output      0 ml  Net    845 ml    Exam:   General:  Pt is alert, follows commands appropriately, not in acute distress  Cardiovascular: Regular rate and rhythm,  no rubs, no gallops  Respiratory: Clear to auscultation bilaterally, no wheezing, no crackles, no rhonchi  Abdomen: Soft, non tender, non distended, bowel sounds present, no guarding  Extremities: pulses DP and PT palpable bilaterally  Neuro: Grossly nonfocal  Data Reviewed: Basic Metabolic Panel:  Recent  Labs Lab 10/13/14 1917 10/14/14 0245 10/15/14 0733 10/17/14 0445  NA 134* 137 138 138  K 4.0 3.4* 4.2 4.1  CL 99 104 107 104  CO2 24 22 20 25   GLUCOSE 112* 105* 78 94  BUN 18 15 15  <5*  CREATININE 1.04 0.97 0.87 1.00  CALCIUM 8.6 8.1* 8.3* 8.3*   Liver Function Tests:  Recent Labs Lab 10/13/14 1917 10/14/14 0245 10/17/14 0445  AST 33 29 14  ALT 28 31 19   ALKPHOS 61 52 57  BILITOT 0.8 0.7 0.5  PROT 8.2 7.0 7.1  ALBUMIN 3.7 3.1* 3.0*    Recent Labs Lab  10/13/14 1917  LIPASE 24   CBC:  Recent Labs Lab 10/13/14 1917 10/14/14 0245 10/15/14 0733 10/17/14 0445  WBC 10.9* 8.0 6.3 9.4  NEUTROABS 8.3*  --   --   --   HGB 14.1 12.5* 12.9* 13.3  HCT 41.0 36.7* 39.5 40.6  MCV 89.1 89.7 91.9 92.3  PLT 224 196 216 244   Scheduled Meds: . carbamazepine  200 mg Oral TID  . famotidine (PEPCID) IV  20 mg Intravenous Q24H  . heparin  5,000 Units Subcutaneous 3 times per day  . piperacillin-tazobactam (ZOSYN)  IV  3.375 g Intravenous 3 times per day   Continuous Infusions: . sodium chloride 10 mL/hr at 10/16/14 1534

## 2014-10-19 NOTE — Progress Notes (Signed)
Medicare Important Message given? YES  Date Medicare IM given:  10/19/2014 Medicare IM given by: Ladarrious Kirksey  

## 2014-10-19 NOTE — Progress Notes (Signed)
Patient ID: Edward Adkins, male   DOB: Jun 21, 1971, 44 y.o.   MRN: [id number]     [facility]      [address] [place], [place] [address]    Phone: [phone] FAX: [phone]     Subjective: Intermittent pain, minimal. Having BMs.  Tolerated soft diet.  Now NPO for increased abscess on repeat CT yesterday.   Objective:  Vital signs:  Filed Vitals:   10/18/14 1715 10/18/14 1958 10/19/14 0418 10/19/14 0946  BP: 121/75 114/70 104/64 116/74  Pulse: 74 70 70 66  Temp: 98.6 F (37 C) 98.7 F (37.1 C) 97.9 F (36.6 C) 98 F (36.7 C)  TempSrc: Oral   Oral  Resp: 18 18 18 18   Height:      Weight:  214 lb (97.07 kg)    SpO2: 99% 99% 100% 98%    Last BM Date: 10/18/14  Intake/Output   Yesterday:  02/14 0701 - 02/15 0700 In: 2505 [P.O.:1080; I.V.:115; IV Piggyback:50] Out: -  This shift:    Physical Exam: General: Pt awake/alert/oriented x4 in no acute distress  Abdomen: Soft. Nondistended. Non tender. No evidence of peritonitis. No incarcerated hernias.    Problem List:   Principal Problem:   Diverticulitis of intestine with perforation Active Problems:   Complex partial seizures   Hyperlipidemia   Cerebral palsy   Diverticulitis large intestine   Abscess   Intra-abdominal abscess    Results:   Labs: No results found for this or any previous visit (from the past 52 hour(s)).  Imaging / Studies: Ct Abdomen Pelvis W Contrast  10/18/2014   CLINICAL DATA:  Abdominal pain. Umbilical area with fever. Pain on left side of the abdomen. Pain started 10/10/2014  EXAM: CT ABDOMEN AND PELVIS WITH CONTRAST  TECHNIQUE: Multidetector CT imaging of the abdomen and pelvis was performed using the standard protocol following bolus administration of intravenous contrast.  CONTRAST:  116mL OMNIPAQUE IOHEXOL 300 MG/ML  SOLN  COMPARISON:  10/13/2014  FINDINGS: Lower chest: The lung bases are clear.  Heart size is normal.  Upper  abdomen: There is a small amount of free intraperitoneal air. This is a new finding since the prior study. Stable small low-attenuation lesions are identified within the liver, likely representing cyst. No focal abnormality identified within the spleen, pancreas, or adrenal glands. The kidneys have a normal appearance. Gallbladder is present and is collapsed.  Gastrointestinal tract: The stomach has a normal appearance. Within the lower central abdomen there is an abscess measuring 5.8 x 4.3 cm. The previously this measured 3.6 cm. There is significant surrounding mesenteric inflammation. The terminal ileum is immediately anterior to this abscess and demonstrates a thickened wall. The base of the appendix is immediately adjacent to the abscess. The appendix is distended. However there is little inflammation around the distal tip of the appendix and the appearance is favored to represent a secondary process. The sigmoid colon continues is show significant inflammation as well as numerous diverticula. On the previous exam contrast extended towards the abscess from the sigmoid colon, suggesting a diverticular abscess.  Pelvis: Urinary bladder has a normal appearance. There is no free pelvic fluid. Significant pelvic mesentery inflammatory change.  Retroperitoneum: Small retroperitoneal and mesenteric lymph nodes are likely reactive. Small locules of gas throughout the mesentery.  Abdominal wall: Unremarkable.  Osseous structures: Unremarkable.  IMPRESSION: 1. Abscess within the lower central abdomen has increased in size, 5.8 cm in diameter. 2. Interval perforation with  small locules of gas seen throughout the mesentery and within the upper abdomen. 3. Thickened terminal ileum and distended appendix, felt to be secondary to the abscess. 4. Significant sigmoid diverticular disease, likely the source of the abscess. 5. The salient findings were discussed with Dr. Lunette Stands on 10/18/2014 at 10:24 am.   Electronically  Signed   By: Nolon Nations M.D.   On: 10/18/2014 10:25    Medications / Allergies:  Scheduled Meds: . carbamazepine  200 mg Oral TID  . famotidine (PEPCID) IV  20 mg Intravenous Q24H  . heparin  5,000 Units Subcutaneous 3 times per day  . piperacillin-tazobactam (ZOSYN)  IV  3.375 g Intravenous 3 times per day   Continuous Infusions: . sodium chloride 10 mL/hr at 10/16/14 1534   PRN Meds:.morphine injection, ondansetron **OR** ondansetron (ZOFRAN) IV  Antibiotics: Anti-infectives    Start     Dose/Rate Route Frequency Ordered Stop   10/14/14 0600  piperacillin-tazobactam (ZOSYN) IVPB 3.375 g     3.375 g 12.5 mL/hr over 240 Minutes Intravenous 3 times per day 10/14/14 0308     10/13/14 2230  piperacillin-tazobactam (ZOSYN) IVPB 3.375 g     3.375 g 100 mL/hr over 30 Minutes Intravenous  Once 10/13/14 2219 10/13/14 2355      Assessment/Plan Sigmoid diverticulitis with microperforation and abscess vs crohn's terminal ileitis with abscess -continue with zosyn D#5 -repeat CT shows an increase in abscess size.  Abdominal exam remains benign.  R for drain placement.  If unable to place, pt will need surgery.  Discussed with the patient and his father as requested. -heparin/SCDs--hold heparin for procedure today -mobilize as tolerated  -pain control -repeat CBC in AM -will need colonoscopy on OP basis   Erby Pian, [facility] Surgery Pager [phone](7A-4:30P) For consults and floor pages call [phone](7A-4:30P)  10/19/2014 10:00 AM

## 2014-10-19 NOTE — Procedures (Signed)
Interventional Radiology Procedure Note  Procedure: Placement of CT guided abscess drain (76F) into diverticular abscess via LLQ approach.  Window assisted by hydrodissection.  40 mL purulence aspirated Complications: None Recommendations: - JP drain - Flush TID - Follow cultures  Signed,  Criselda Peaches, MD

## 2014-10-19 NOTE — Sedation Documentation (Signed)
Report given to [facility] Radiology

## 2014-10-19 NOTE — Consult Note (Signed)
Chief Complaint: Chief Complaint  Patient presents with  . Abdominal Pain  abdominal abscess  Referring Physician(s): [facility]CS  History of Present Illness: Edward Adkins is a 44 y.o. male   Pt with cerebral palsy To ED with N/V and abd pain CT revealed small diverticular abscess 2/10 "no safe window" per Dr Anselm Pancoast 2/14 CT does reveal slightly larger abscess Request for re attempt at aspiration/drain Dr Laurence Ferrari has reviewed imaging and feels he may have a narrow window to proceed Now scheduled for attempt at abscess aspiration/drain placement  I have seen and examined pt   Past Medical History  Diagnosis Date  . Complex partial seizure disorder 05/78    Head CT's multiple-WNL, EEG R temporal lobe discharge (GNA)  . Cerebral palsy   . Mild mental retardation   . Diverticulitis 10/14/2014  . Perforated bowel 10/14/2014    History reviewed. No pertinent past surgical history.  Allergies: Phenobarbital  Medications: Prior to Admission medications   Medication Sig Start Date End Date Taking? Authorizing Provider  carbamazepine (TEGRETOL) 200 MG tablet Take 1 tablet (200 mg total) by mouth 3 (three) times daily. 08/06/14  Yes Dennie Bible, NP  ciprofloxacin (CIPRO) 500 MG tablet Take 1 tablet (500 mg total) by mouth 2 (two) times daily. 10/12/14  Yes [name], MD  Multiple Vitamin (MULTIVITAMIN) tablet Take 2 tablets by mouth daily.    Yes Historical Provider, MD     Family History  Problem Relation Age of Onset  . Cancer Mother 58    Leukemia  . Cancer Father 49    Prostate ca  . CAD Father   . Diabetes Father     History   Social History  . Marital Status: Single    Spouse Name: N/A  . Number of Children: 0  . Years of Education: 12   Occupational History  .     Social History Main Topics  . Smoking status: Never Smoker   . Smokeless tobacco: Never Used  . Alcohol Use: No  . Drug Use: No  . Sexual Activity: Not on file   Other Topics  Concern  . None   Social History Narrative   Patient is single and lives with his father.   Patient drinks two caffeine drinks daily.   Patient is ambi-dextrous.   Patient has a high school education.   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             Review of Systems: A 12 point ROS discussed and pertinent positives are indicated in the HPI above.  All other systems are negative.  Review of Systems  Constitutional: Negative for activity change and appetite change.  Respiratory: Negative for cough and shortness of breath.   Cardiovascular: Negative for chest pain.  Gastrointestinal: Positive for abdominal pain. Negative for nausea and vomiting.  Musculoskeletal: Negative for back pain.  Psychiatric/Behavioral: Negative for behavioral problems and confusion.    Vital Signs: BP 104/64 mmHg  Pulse 70  Temp(Src) 97.9 F (36.6 C) (Oral)  Resp 18  Ht 6' (1.829 m)  Wt 97.07 kg (214 lb)  BMI 29.02 kg/m2  SpO2 100%  Physical Exam  Constitutional: He is oriented to person, place, and time. He appears well-nourished.  Cardiovascular: Normal rate, regular rhythm and normal heart sounds.   No murmur heard. Pulmonary/Chest: Effort normal and breath sounds normal. He has no wheezes.  Abdominal: Soft. Bowel sounds are normal. There is no tenderness.  Musculoskeletal: Normal  range of motion.  Neurological: He is alert and oriented to person, place, and time.  Skin: Skin is warm and dry.  Psychiatric: He has a normal mood and affect. His behavior is normal. Judgment and thought content normal.  Nursing note and vitals reviewed.   Mallampati Score:  MD Evaluation Airway: WNL Heart: WNL Abdomen: WNL Chest/ Lungs: WNL ASA  Classification: 2 Mallampati/Airway Score: Two  Imaging: Ct Abdomen Pelvis W Contrast  10/18/2014   CLINICAL DATA:  Abdominal pain. Umbilical area with fever. Pain on left side of the abdomen. Pain started 10/10/2014  EXAM: CT ABDOMEN AND PELVIS WITH CONTRAST   TECHNIQUE: Multidetector CT imaging of the abdomen and pelvis was performed using the standard protocol following bolus administration of intravenous contrast.  CONTRAST:  152mL OMNIPAQUE IOHEXOL 300 MG/ML  SOLN  COMPARISON:  10/13/2014  FINDINGS: Lower chest: The lung bases are clear.  Heart size is normal.  Upper abdomen: There is a small amount of free intraperitoneal air. This is a new finding since the prior study. Stable small low-attenuation lesions are identified within the liver, likely representing cyst. No focal abnormality identified within the spleen, pancreas, or adrenal glands. The kidneys have a normal appearance. Gallbladder is present and is collapsed.  Gastrointestinal tract: The stomach has a normal appearance. Within the lower central abdomen there is an abscess measuring 5.8 x 4.3 cm. The previously this measured 3.6 cm. There is significant surrounding mesenteric inflammation. The terminal ileum is immediately anterior to this abscess and demonstrates a thickened wall. The base of the appendix is immediately adjacent to the abscess. The appendix is distended. However there is little inflammation around the distal tip of the appendix and the appearance is favored to represent a secondary process. The sigmoid colon continues is show significant inflammation as well as numerous diverticula. On the previous exam contrast extended towards the abscess from the sigmoid colon, suggesting a diverticular abscess.  Pelvis: Urinary bladder has a normal appearance. There is no free pelvic fluid. Significant pelvic mesentery inflammatory change.  Retroperitoneum: Small retroperitoneal and mesenteric lymph nodes are likely reactive. Small locules of gas throughout the mesentery.  Abdominal wall: Unremarkable.  Osseous structures: Unremarkable.  IMPRESSION: 1. Abscess within the lower central abdomen has increased in size, 5.8 cm in diameter. 2. Interval perforation with small locules of gas seen throughout  the mesentery and within the upper abdomen. 3. Thickened terminal ileum and distended appendix, felt to be secondary to the abscess. 4. Significant sigmoid diverticular disease, likely the source of the abscess. 5. The salient findings were discussed with Dr. Lunette Stands on 10/18/2014 at 10:24 am.   Electronically Signed   By: Nolon Nations M.D.   On: 10/18/2014 10:25   Ct Abdomen Pelvis W Contrast  10/13/2014   CLINICAL DATA:  Low abdominal pain and fever.  EXAM: CT ABDOMEN AND PELVIS WITH CONTRAST  TECHNIQUE: Multidetector CT imaging of the abdomen and pelvis was performed using the standard protocol following bolus administration of intravenous contrast.  CONTRAST:  185mL OMNIPAQUE IOHEXOL 300 MG/ML  SOLN  COMPARISON:  None.  FINDINGS: BODY WALL: Unremarkable.  LOWER CHEST: Unremarkable.  ABDOMEN/PELVIS:  Liver: Sub cm low-density liver likely reflects cysts.  Biliary: No evidence of biliary obstruction or stone.  Pancreas: Unremarkable.  Spleen: Unremarkable.  Adrenals: Unremarkable.  Kidneys and ureters: No hydronephrosis or stone.  Bladder: Unremarkable.  Reproductive: Unremarkable.  Bowel: Bowel perforation in the low mid abdomen with 4 cm gas and fluid containing abscess that also contains  oral contrast. A tract of oral contrast extends towards the sigmoid colon, favoring a diverticular perforation. The appendix base is contiguous with the abscess and is overdistended and avidly enhancing. While the appendix is likely obstructed, the inflammatory changes around the tip and mid portion are not as extensive as expected for primary appendicitis. There is terminal ileitis with circumferential submucosal edema, likely secondary. Bowel encompasses most of the abscess circumference, making percutaneous drainage difficult.  Retroperitoneum: No mass or adenopathy.  Peritoneum: No ascites or pneumoperitoneum.  Vascular: No acute abnormality.  OSSEOUS: No acute abnormalities.  These results were called by telephone  at the time of interpretation on 10/13/2014 at 4:00 pm to Dr. [name] , who verbally acknowledged these results.  IMPRESSION: Contained bowel perforation in the low abdomen with 4 cm abscess. A diverticular source is favored over ruptured appendix, as reasoned above.   Electronically Signed   By: Monte Fantasia M.D.   On: 10/13/2014 16:04    Labs:  CBC:  Recent Labs  10/13/14 1917 10/14/14 0245 10/15/14 0733 10/17/14 0445  WBC 10.9* 8.0 6.3 9.4  HGB 14.1 12.5* 12.9* 13.3  HCT 41.0 36.7* 39.5 40.6  PLT 224 196 216 244    COAGS:  Recent Labs  10/14/14 0245  INR 1.20    BMP:  Recent Labs  10/13/14 1917 10/14/14 0245 10/15/14 0733 10/17/14 0445  NA 134* 137 138 138  K 4.0 3.4* 4.2 4.1  CL 99 104 107 104  CO2 24 22 20 25   GLUCOSE 112* 105* 78 94  BUN 18 15 15  <5*  CALCIUM 8.6 8.1* 8.3* 8.3*  CREATININE 1.04 0.97 0.87 1.00  GFRNONAA 88* >90 >90 >90  GFRAA >90 >90 >90 >90    LIVER FUNCTION TESTS:  Recent Labs  10/12/14 1200 10/13/14 1917 10/14/14 0245 10/17/14 0445  BILITOT 0.8 0.8 0.7 0.5  AST 12 33 29 14  ALT 17 28 31 19   ALKPHOS 56 61 52 57  PROT 8.7* 8.2 7.0 7.1  ALBUMIN 4.4 3.7 3.1* 3.0*    TUMOR MARKERS: No results for input(s): AFPTM, CEA, CA199, CHROMGRNA in the last 8760 hours.  Assessment and Plan:  Diverticular abscess Small window for aspiration/drain per Dr Laurence Ferrari Dr [facility] willing to attempt asp/drain placement Pt aware of procedure benefits and risks including but not limited to Infection; bleeding; damage to surrounding structures Agreeable to proceed Consent signed andin chart  Thank you for this interesting consult.  I greatly enjoyed meeting Edward Adkins and look forward to participating in their care.  Signed: Armonie Staten A 10/19/2014, 9:25 AM   I spent a total of 40 Minutes   in face to face in clinical consultation, greater than 50% of which was counseling/coordinating care for diverticular abscess  aspiration/drain

## 2014-10-20 LAB — CBC
HCT: 39.2 % (ref 39.0–52.0)
HEMOGLOBIN: 12.8 g/dL — AB (ref 13.0–17.0)
MCH: 30.3 pg (ref 26.0–34.0)
MCHC: 32.7 g/dL (ref 30.0–36.0)
MCV: 92.9 fL (ref 78.0–100.0)
Platelets: 276 10*3/uL (ref 150–400)
RBC: 4.22 MIL/uL (ref 4.22–5.81)
RDW: 12.8 % (ref 11.5–15.5)
WBC: 7.9 10*3/uL (ref 4.0–10.5)

## 2014-10-20 LAB — BASIC METABOLIC PANEL
Anion gap: 12 (ref 5–15)
BUN: 12 mg/dL (ref 6–23)
CALCIUM: 8.4 mg/dL (ref 8.4–10.5)
CO2: 21 mmol/L (ref 19–32)
Chloride: 105 mmol/L (ref 96–112)
Creatinine, Ser: 0.89 mg/dL (ref 0.50–1.35)
GFR calc Af Amer: >90 mL/min (ref >90)
GFR calc non Af Amer: >90 mL/min (ref >90)
Glucose, Bld: 81 mg/dL (ref 70–99)
POTASSIUM: 4 mmol/L (ref 3.5–5.1)
SODIUM: 138 mmol/L (ref 135–145)

## 2014-10-20 MED ORDER — FAMOTIDINE 20 MG PO TABS
20.0000 mg | ORAL_TABLET | Freq: Every day | ORAL | Status: DC
  Administered 2014-10-20 – 2014-10-21 (×2): 20 mg via ORAL
  Filled 2014-10-20 (×3): qty 1

## 2014-10-20 NOTE — Progress Notes (Signed)
Patient ID: Edward Adkins, male   DOB: Dec 02, 1970, 44 y.o.   MRN: [id number]  [facility] PROGRESS NOTE  Edward Adkins DJS:[id number] DOB: 05-Aug-1971 DOA: 10/13/2014 PCP: [name], MD   Brief narrative:    44 y.o. male with cerebral palsy with complex partial seizure who presented with complaints of abdominal pain and was found to have acute sigmoid diverticulitis with perforation and small diverticular abscess 4 cm in diameter. CT abdomen and pelvis notable for Contained bowel perforation in the low abdomen with 4 cm abscess. A diverticular source is favored over ruptured appendix. Pt started on Zosyn and WBC improved from 17 K --> 8 K. Interventional radiology initially felt abscess not amenable to drainage but repeat CT abd with increasing size of the abscess. Surgery consulted for assistance.   Assessment/Plan:     Diverticulitis of intestine with microperforation and abscess vs crohn's terminal ileitis  - drain placed by IR 10/19/2014. - per surgery team, will need repeat CT scan in ~ 5 days or when output decreases - provide analgesia as needed  - allow clear liquids, would not advance past this yet as pt has episode of emesis over the past 24 hours  - will need colonoscopy on OP basis  - continue Zosyn day #7  Complex partial seizures/Hyperlipidemia/Cerebral palsy - Stable continue home regimen with carbamazepine  - No seizures while hospitalized  Heparin SQ for DVT prophylaxis   Code Status: Full.  Family Communication: plan of care discussed with the patient Disposition Plan: D/C once cleared by surgery team   IV access:  Peripheral IV  Procedures and diagnostic studies:    Ct Abdomen Pelvis W Contrast 10/18/2014 Abscess within the lower central abdomen has increased in size, 5.8 cm in diameter. 2. Interval perforation with small locules of gas seen throughout the mesentery and within the upper abdomen. 3. Thickened terminal ileum and distended appendix,  felt to be secondary to the abscess. 4. Significant sigmoid diverticular disease, likely the source of the abscess  Ct Abdomen Pelvis W Contrast 10/13/2014 Contained bowel perforation in the low abdomen with 4 cm abscess. A diverticular source is favored over ruptured appendix.  Medical Consultants:  Surgery  IR  Other Consultants:  None   IAnti-Infectives:   Zosyn 2/10 -->  [name], MD  [facility] Pager [phone]  If 7PM-7AM, please contact night-coverage [email or web address] Password [facility] 10/20/2014, 10:57 AM   LOS: 6 days   HPI/Subjective: 1 episode of emesis over the past 24 hours, no nausea or vomiting this morning  Objective: Filed Vitals:   10/19/14 1715 10/19/14 2151 10/20/14 0446 10/20/14 1000  BP: 110/71 107/71 96/66 120/69  Pulse: 71 95 71 88  Temp: 98.1 F (36.7 C) 99.2 F (37.3 C) 98.4 F (36.9 C) 98.5 F (36.9 C)  TempSrc: Oral Oral Oral Oral  Resp: 17 16 16 18   Height:  6\' 4"  (1.93 m)    Weight:  97.569 kg (215 lb 1.6 oz)    SpO2: 98% 96% 97% 96%    Intake/Output Summary (Last 24 hours) at 10/20/14 1057 Last data filed at 10/20/14 0900  Gross per 24 hour  Intake    360 ml  Output    127 ml  Net    233 ml    Exam:   General:  Pt is alert, follows commands appropriately, not in acute distress  Cardiovascular: Regular rate and rhythm, S1/S2, no murmurs, no rubs, no gallops  Respiratory: Clear to auscultation bilaterally, no wheezing, no crackles,  no rhonchi  Abdomen: Soft,  non distended, bowel sounds present, no guarding  Extremities: No edema, pulses DP and PT palpable bilaterally  Neuro: Grossly nonfocal  Data Reviewed: Basic Metabolic Panel:  Recent Labs Lab 10/13/14 1917 10/14/14 0245 10/15/14 0733 10/17/14 0445 10/20/14 0533  NA 134* 137 138 138 138  K 4.0 3.4* 4.2 4.1 4.0  CL 99 104 107 104 105  CO2 24 22 20 25 21   GLUCOSE 112* 105* 78 94 81  BUN 18 15 15  <5* 12  CREATININE 1.04 0.97 0.87 1.00 0.89  CALCIUM 8.6 8.1*  8.3* 8.3* 8.4   Liver Function Tests:  Recent Labs Lab 10/13/14 1917 10/14/14 0245 10/17/14 0445  AST 33 29 14  ALT 28 31 19   ALKPHOS 61 52 57  BILITOT 0.8 0.7 0.5  PROT 8.2 7.0 7.1  ALBUMIN 3.7 3.1* 3.0*    Recent Labs Lab 10/13/14 1917  LIPASE 24   CBC:  Recent Labs Lab 10/13/14 1917 10/14/14 0245 10/15/14 0733 10/17/14 0445 10/20/14 0533  WBC 10.9* 8.0 6.3 9.4 7.9  NEUTROABS 8.3*  --   --   --   --   HGB 14.1 12.5* 12.9* 13.3 12.8*  HCT 41.0 36.7* 39.5 40.6 39.2  MCV 89.1 89.7 91.9 92.3 92.9  PLT 224 196 216 244 276    Recent Results (from the past 240 hour(s))  Culture, routine-abscess     Status: None (Preliminary result)   Collection Time: 10/19/14  2:54 PM  Result Value Ref Range Status   Specimen Description ABSCESS ABDOMEN  Final   Special Requests Normal  Final   Gram Stain   Final    ABUNDANT WBC PRESENT,BOTH PMN AND MONONUCLEAR NO SQUAMOUS EPITHELIAL CELLS SEEN NO ORGANISMS SEEN Performed at [facility]    Culture PENDING  Incomplete   Report Status PENDING  Incomplete     Scheduled Meds: . carbamazepine  200 mg Oral TID  . famotidine  20 mg Oral QHS  . heparin  5,000 Units Subcutaneous 3 times per day  . piperacillin-tazobactam (ZOSYN)  IV  3.375 g Intravenous 3 times per day   Continuous Infusions: . sodium chloride 10 mL/hr at 10/16/14 1534

## 2014-10-20 NOTE — Progress Notes (Signed)
Patient ID: Edward Adkins, male   DOB: 1971-05-25, 44 y.o.   MRN: [id number]    Subjective: Pt feels ok today.  Some soreness of left side around the drain.  Had some nausea and 1 episode of emesis last night  Objective: Vital signs in last 24 hours: Temp:  [98 F (36.7 C)-99.2 F (37.3 C)] 98.4 F (36.9 C) (02/16 0446) Pulse Rate:  [61-95] 71 (02/16 0446) Resp:  [10-18] 16 (02/16 0446) BP: (96-121)/(66-80) 96/66 mmHg (02/16 0446) SpO2:  [94 %-98 %] 97 % (02/16 0446) Weight:  [215 lb 1.6 oz (97.569 kg)] 215 lb 1.6 oz (97.569 kg) (02/15 2151) Last BM Date: 10/18/14  Intake/Output from previous day: 02/15 0701 - 02/16 0700 In: 120 [I.V.:120] Out: 107 [Drains:107] Intake/Output this shift:    PE: Abd: soft, tender around the JP drain, otherwise benign, +BS, JP with serosang output, mild cloudiness Heart: regular Lungs: CTAB  Lab Results:   Recent Labs  10/20/14 0533  WBC 7.9  HGB 12.8*  HCT 39.2  PLT 276   BMET  Recent Labs  10/20/14 0533  NA 138  K 4.0  CL 105  CO2 21  GLUCOSE 81  BUN 12  CREATININE 0.89  CALCIUM 8.4   PT/INR No results for input(s): LABPROT, INR in the last 72 hours. CMP     Component Value Date/Time   NA 138 10/20/2014 0533   NA 143 08/06/2014 1426   K 4.0 10/20/2014 0533   CL 105 10/20/2014 0533   CO2 21 10/20/2014 0533   GLUCOSE 81 10/20/2014 0533   GLUCOSE 88 08/06/2014 1426   BUN 12 10/20/2014 0533   BUN 11 08/06/2014 1426   CREATININE 0.89 10/20/2014 0533   CALCIUM 8.4 10/20/2014 0533   PROT 7.1 10/17/2014 0445   PROT 7.9 08/06/2014 1426   ALBUMIN 3.0* 10/17/2014 0445   AST 14 10/17/2014 0445   ALT 19 10/17/2014 0445   ALKPHOS 57 10/17/2014 0445   BILITOT 0.5 10/17/2014 0445   GFRNONAA >90 10/20/2014 0533   GFRAA >90 10/20/2014 0533   Lipase     Component Value Date/Time   LIPASE 24 10/13/2014 1917       Studies/Results: Ct Abdomen Pelvis W Contrast  10/18/2014   CLINICAL DATA:  Abdominal pain. Umbilical  area with fever. Pain on left side of the abdomen. Pain started 10/10/2014  EXAM: CT ABDOMEN AND PELVIS WITH CONTRAST  TECHNIQUE: Multidetector CT imaging of the abdomen and pelvis was performed using the standard protocol following bolus administration of intravenous contrast.  CONTRAST:  14mL OMNIPAQUE IOHEXOL 300 MG/ML  SOLN  COMPARISON:  10/13/2014  FINDINGS: Lower chest: The lung bases are clear.  Heart size is normal.  Upper abdomen: There is a small amount of free intraperitoneal air. This is a new finding since the prior study. Stable small low-attenuation lesions are identified within the liver, likely representing cyst. No focal abnormality identified within the spleen, pancreas, or adrenal glands. The kidneys have a normal appearance. Gallbladder is present and is collapsed.  Gastrointestinal tract: The stomach has a normal appearance. Within the lower central abdomen there is an abscess measuring 5.8 x 4.3 cm. The previously this measured 3.6 cm. There is significant surrounding mesenteric inflammation. The terminal ileum is immediately anterior to this abscess and demonstrates a thickened wall. The base of the appendix is immediately adjacent to the abscess. The appendix is distended. However there is little inflammation around the distal tip of the appendix and the appearance  is favored to represent a secondary process. The sigmoid colon continues is show significant inflammation as well as numerous diverticula. On the previous exam contrast extended towards the abscess from the sigmoid colon, suggesting a diverticular abscess.  Pelvis: Urinary bladder has a normal appearance. There is no free pelvic fluid. Significant pelvic mesentery inflammatory change.  Retroperitoneum: Small retroperitoneal and mesenteric lymph nodes are likely reactive. Small locules of gas throughout the mesentery.  Abdominal wall: Unremarkable.  Osseous structures: Unremarkable.  IMPRESSION: 1. Abscess within the lower central  abdomen has increased in size, 5.8 cm in diameter. 2. Interval perforation with small locules of gas seen throughout the mesentery and within the upper abdomen. 3. Thickened terminal ileum and distended appendix, felt to be secondary to the abscess. 4. Significant sigmoid diverticular disease, likely the source of the abscess. 5. The salient findings were discussed with Dr. Lunette Stands on 10/18/2014 at 10:24 am.   Electronically Signed   By: Nolon Nations M.D.   On: 10/18/2014 10:25   Ct Image Guided Drainage By Percutaneous Catheter  10/19/2014   CLINICAL DATA:  44 year old male with enlarging diverticular abscess despite IV antibiotic therapy. The abscess is centered within the ileal mesentery and nearly completely surrounded by bowel. Given worsening of the abscess collection, an attempt will be made at percutaneous drain placement if a window can be identified.  EXAM: CT IMAGE GUIDED DRAINAGE BY PERCUTANEOUS CATHETER  Date: 10/19/2014  PROCEDURE: 1. CT-guided placement of 12 French abscess drain Interventional Radiologist:  Criselda Peaches, MD  ANESTHESIA/SEDATION: Moderate (conscious) sedation was used. 3 mg Versed, 150 mcg Fentanyl were administered intravenously. The patient's vital signs were monitored continuously by radiology nursing throughout the procedure.  Sedation Time: 32 minutes  MEDICATIONS: None additional  TECHNIQUE: Informed consent was obtained from the patient following explanation of the procedure, risks, benefits and alternatives. The patient understands, agrees and consents for the procedure. All questions were addressed. A time out was performed.  A planning axial CT scan was performed. The abscess collection was identified. There is a very small when no posterior to the left rectus abdominis muscle. An appropriate skin entry site was selected and marked. The region was then sterilely prepped and draped in standard fashion using Betadine skin prep.  Under intermittent CT fluoroscopic  guidance, first a 22 gauge spinal needle was advanced just through the posterior fascia of the left rectus abdominis muscle. Peristalsing loops of adjacent small bowel were intermittently noted within the planned trajectory. Therefore, hydro dissection was carried out using approximately 50 mL sterile saline. This sufficiently displaced the loops of small bowel to allow for safe advancement of a 22 gauge Accustick needle into the fluid and gas collection. Needle advancement was performed under intermittent CT fluoroscopy. A 0.018 inch wire was then coiled within the fluid cavity and the needle exchanged for the Accustick sheath. A 0.035 inch Amplatz wire was then advanced into the fluid cavity. The tract was then dilated to 15 [place] and a Cook 12 [place] all-purpose drainage catheter advanced over the wire and formed within the abscess.  Approximately 40 mL of thick, purulent material was aspirated. The tube was secured to the skin with 0 Prolene suture. The tube was flushed and connected to JP bulb suction. The patient tolerated the procedure well.  COMPLICATIONS: None  IMPRESSION: 1. Technically successful placement of a 12 French percutaneous drainage catheter into the diverticular abscess. Hydro dissection was utilized to facilitate a safe trajectory. 2. Aspiration yielded 40 mL frankly purulent  material.  PLAN: 1. Maintain to JP bulb suction until drained decreases to less than 20 cc daily. 2. Flush TID until drainage decreases to less than 20 cc daily 3. Once drainage decreases to less than 20 cc daily, recommend drain injection under fluoroscopy to evaluate for fistulous connection to the adjacent colon prior to tube removal. 4. Patient can be followed in the IR outpatient [facility]. Recommend first visit in 2 weeks following discharge. Signed,  Criselda Peaches, MD  [facility]  [facility]   Electronically Signed   By: Jacqulynn Cadet M.D.   On:  10/19/2014 15:33    Anti-infectives: Anti-infectives    Start     Dose/Rate Route Frequency Ordered Stop   10/14/14 0600  piperacillin-tazobactam (ZOSYN) IVPB 3.375 g     3.375 g 12.5 mL/hr over 240 Minutes Intravenous 3 times per day 10/14/14 0308     10/13/14 2230  piperacillin-tazobactam (ZOSYN) IVPB 3.375 g     3.375 g 100 mL/hr over 30 Minutes Intravenous  Once 10/13/14 2219 10/13/14 2355       Assessment/Plan   Sigmoid diverticulitis with microperforation and abscess vs crohn's terminal ileitis with abscess -continue with zosyn D#6 -drain placed by IR yesterday.  Repeat CT scan in around 5 days or when output decreases. -heparin/SCDs -mobilize as tolerated  -pain control -clear liquids, would not advance past this yet. -will need colonoscopy on OP basis    LOS: 6 days    Tawanda Schall E 10/20/2014, 9:24 AM Pager: [phone]

## 2014-10-20 NOTE — Progress Notes (Signed)
Referring Physician(s): CCS  Subjective:  Divertic abscess drain placed 2/15 Feels better already today   Allergies: Phenobarbital  Medications: Prior to Admission medications   Medication Sig Start Date End Date Taking? Authorizing Provider  carbamazepine (TEGRETOL) 200 MG tablet Take 1 tablet (200 mg total) by mouth 3 (three) times daily. 08/06/14  Yes Dennie Bible, NP  ciprofloxacin (CIPRO) 500 MG tablet Take 1 tablet (500 mg total) by mouth 2 (two) times daily. 10/12/14  Yes [name], MD  Multiple Vitamin (MULTIVITAMIN) tablet Take 2 tablets by mouth daily.    Yes Historical Provider, MD     Vital Signs: BP 96/66 mmHg  Pulse 71  Temp(Src) 98.4 F (36.9 C) (Oral)  Resp 16  Ht 6\' 4"  (1.93 m)  Wt 97.569 kg (215 lb 1.6 oz)  BMI 26.19 kg/m2  SpO2 97%  Physical Exam  Abdominal:  Left abd drain site clean and dry Sl tender at site No bleeding; no sign of infection Cx pending 110 cc yesterday 10 cc in JP now Output bloody    Imaging: Ct Abdomen Pelvis W Contrast  10/18/2014   CLINICAL DATA:  Abdominal pain. Umbilical area with fever. Pain on left side of the abdomen. Pain started 10/10/2014  EXAM: CT ABDOMEN AND PELVIS WITH CONTRAST  TECHNIQUE: Multidetector CT imaging of the abdomen and pelvis was performed using the standard protocol following bolus administration of intravenous contrast.  CONTRAST:  19mL OMNIPAQUE IOHEXOL 300 MG/ML  SOLN  COMPARISON:  10/13/2014  FINDINGS: Lower chest: The lung bases are clear.  Heart size is normal.  Upper abdomen: There is a small amount of free intraperitoneal air. This is a new finding since the prior study. Stable small low-attenuation lesions are identified within the liver, likely representing cyst. No focal abnormality identified within the spleen, pancreas, or adrenal glands. The kidneys have a normal appearance. Gallbladder is present and is collapsed.  Gastrointestinal tract: The stomach has a normal  appearance. Within the lower central abdomen there is an abscess measuring 5.8 x 4.3 cm. The previously this measured 3.6 cm. There is significant surrounding mesenteric inflammation. The terminal ileum is immediately anterior to this abscess and demonstrates a thickened wall. The base of the appendix is immediately adjacent to the abscess. The appendix is distended. However there is little inflammation around the distal tip of the appendix and the appearance is favored to represent a secondary process. The sigmoid colon continues is show significant inflammation as well as numerous diverticula. On the previous exam contrast extended towards the abscess from the sigmoid colon, suggesting a diverticular abscess.  Pelvis: Urinary bladder has a normal appearance. There is no free pelvic fluid. Significant pelvic mesentery inflammatory change.  Retroperitoneum: Small retroperitoneal and mesenteric lymph nodes are likely reactive. Small locules of gas throughout the mesentery.  Abdominal wall: Unremarkable.  Osseous structures: Unremarkable.  IMPRESSION: 1. Abscess within the lower central abdomen has increased in size, 5.8 cm in diameter. 2. Interval perforation with small locules of gas seen throughout the mesentery and within the upper abdomen. 3. Thickened terminal ileum and distended appendix, felt to be secondary to the abscess. 4. Significant sigmoid diverticular disease, likely the source of the abscess. 5. The salient findings were discussed with Dr. Lunette Stands on 10/18/2014 at 10:24 am.   Electronically Signed   By: Nolon Nations M.D.   On: 10/18/2014 10:25   Ct Image Guided Drainage By Percutaneous Catheter  10/19/2014   CLINICAL DATA:  44 year old male with enlarging  diverticular abscess despite IV antibiotic therapy. The abscess is centered within the ileal mesentery and nearly completely surrounded by bowel. Given worsening of the abscess collection, an attempt will be made at percutaneous drain  placement if a window can be identified.  EXAM: CT IMAGE GUIDED DRAINAGE BY PERCUTANEOUS CATHETER  Date: 10/19/2014  PROCEDURE: 1. CT-guided placement of 12 French abscess drain Interventional Radiologist:  Criselda Peaches, MD  ANESTHESIA/SEDATION: Moderate (conscious) sedation was used. 3 mg Versed, 150 mcg Fentanyl were administered intravenously. The patient's vital signs were monitored continuously by radiology nursing throughout the procedure.  Sedation Time: 32 minutes  MEDICATIONS: None additional  TECHNIQUE: Informed consent was obtained from the patient following explanation of the procedure, risks, benefits and alternatives. The patient understands, agrees and consents for the procedure. All questions were addressed. A time out was performed.  A planning axial CT scan was performed. The abscess collection was identified. There is a very small when no posterior to the left rectus abdominis muscle. An appropriate skin entry site was selected and marked. The region was then sterilely prepped and draped in standard fashion using Betadine skin prep.  Under intermittent CT fluoroscopic guidance, first a 22 gauge spinal needle was advanced just through the posterior fascia of the left rectus abdominis muscle. Peristalsing loops of adjacent small bowel were intermittently noted within the planned trajectory. Therefore, hydro dissection was carried out using approximately 50 mL sterile saline. This sufficiently displaced the loops of small bowel to allow for safe advancement of a 22 gauge Accustick needle into the fluid and gas collection. Needle advancement was performed under intermittent CT fluoroscopy. A 0.018 inch wire was then coiled within the fluid cavity and the needle exchanged for the Accustick sheath. A 0.035 inch Amplatz wire was then advanced into the fluid cavity. The tract was then dilated to 73 [place] and a Cook 12 [place] all-purpose drainage catheter advanced over the wire and formed within the  abscess.  Approximately 40 mL of thick, purulent material was aspirated. The tube was secured to the skin with 0 Prolene suture. The tube was flushed and connected to JP bulb suction. The patient tolerated the procedure well.  COMPLICATIONS: None  IMPRESSION: 1. Technically successful placement of a 12 French percutaneous drainage catheter into the diverticular abscess. Hydro dissection was utilized to facilitate a safe trajectory. 2. Aspiration yielded 40 mL frankly purulent material.  PLAN: 1. Maintain to JP bulb suction until drained decreases to less than 20 cc daily. 2. Flush TID until drainage decreases to less than 20 cc daily 3. Once drainage decreases to less than 20 cc daily, recommend drain injection under fluoroscopy to evaluate for fistulous connection to the adjacent colon prior to tube removal. 4. Patient can be followed in the IR outpatient [facility]. Recommend first visit in 2 weeks following discharge. Signed,  Criselda Peaches, MD  [facility]  [facility]   Electronically Signed   By: Jacqulynn Cadet M.D.   On: 10/19/2014 15:33    Labs:  CBC:  Recent Labs  10/14/14 0245 10/15/14 0733 10/17/14 0445 10/20/14 0533  WBC 8.0 6.3 9.4 7.9  HGB 12.5* 12.9* 13.3 12.8*  HCT 36.7* 39.5 40.6 39.2  PLT 196 216 244 276    COAGS:  Recent Labs  10/14/14 0245  INR 1.20    BMP:  Recent Labs  10/14/14 0245 10/15/14 0733 10/17/14 0445 10/20/14 0533  NA 137 138 138 138  K 3.4* 4.2 4.1 4.0  CL 104 107 104 105  CO2 22 20 25 21   GLUCOSE 105* 78 94 81  BUN 15 15 <5* 12  CALCIUM 8.1* 8.3* 8.3* 8.4  CREATININE 0.97 0.87 1.00 0.89  GFRNONAA >90 >90 >90 >90  GFRAA >90 >90 >90 >90    LIVER FUNCTION TESTS:  Recent Labs  10/12/14 1200 10/13/14 1917 10/14/14 0245 10/17/14 0445  BILITOT 0.8 0.8 0.7 0.5  AST 12 33 29 14  ALT 17 28 31 19   ALKPHOS 56 61 52 57  PROT 8.7* 8.2 7.0 7.1  ALBUMIN 4.4 3.7 3.1* 3.0*     Assessment and Plan:  L abd abscess drain intact Will follow Will need drain inj prior to pull When output less than 15 cc/day   Signed: Trease Bremner A 10/20/2014, 7:52 AM   I spent a total of 15 minutes in face to face in clinical consultation/evaluation, greater than 50% of which was counseling/coordinating care for L abd abscess drain

## 2014-10-21 NOTE — Progress Notes (Signed)
Referring Physician(s): CCS  Subjective:  Diverticular abscess drain: LLQ placed 2/15 Better daily  Allergies: Phenobarbital  Medications: Prior to Admission medications   Medication Sig Start Date End Date Taking? Authorizing Provider  carbamazepine (TEGRETOL) 200 MG tablet Take 1 tablet (200 mg total) by mouth 3 (three) times daily. 08/06/14  Yes Dennie Bible, NP  ciprofloxacin (CIPRO) 500 MG tablet Take 1 tablet (500 mg total) by mouth 2 (two) times daily. 10/12/14  Yes [name], MD  Multiple Vitamin (MULTIVITAMIN) tablet Take 2 tablets by mouth daily.    Yes Historical Provider, MD     Vital Signs: BP 100/66 mmHg  Pulse 65  Temp(Src) 98 F (36.7 C) (Oral)  Resp 18  Ht 6\' 4"  (1.93 m)  Wt 96.9 kg (213 lb 10 oz)  BMI 26.01 kg/m2  SpO2 98%  Physical Exam  Abdominal: Soft. Bowel sounds are normal.  Site of LLQ drain is sl tender No bleeding Output 50 cc yesterday 5 cc in JP Bloody output No growth     Imaging: Ct Abdomen Pelvis W Contrast  10/18/2014   CLINICAL DATA:  Abdominal pain. Umbilical area with fever. Pain on left side of the abdomen. Pain started 10/10/2014  EXAM: CT ABDOMEN AND PELVIS WITH CONTRAST  TECHNIQUE: Multidetector CT imaging of the abdomen and pelvis was performed using the standard protocol following bolus administration of intravenous contrast.  CONTRAST:  163mL OMNIPAQUE IOHEXOL 300 MG/ML  SOLN  COMPARISON:  10/13/2014  FINDINGS: Lower chest: The lung bases are clear.  Heart size is normal.  Upper abdomen: There is a small amount of free intraperitoneal air. This is a new finding since the prior study. Stable small low-attenuation lesions are identified within the liver, likely representing cyst. No focal abnormality identified within the spleen, pancreas, or adrenal glands. The kidneys have a normal appearance. Gallbladder is present and is collapsed.  Gastrointestinal tract: The stomach has a normal appearance. Within the lower  central abdomen there is an abscess measuring 5.8 x 4.3 cm. The previously this measured 3.6 cm. There is significant surrounding mesenteric inflammation. The terminal ileum is immediately anterior to this abscess and demonstrates a thickened wall. The base of the appendix is immediately adjacent to the abscess. The appendix is distended. However there is little inflammation around the distal tip of the appendix and the appearance is favored to represent a secondary process. The sigmoid colon continues is show significant inflammation as well as numerous diverticula. On the previous exam contrast extended towards the abscess from the sigmoid colon, suggesting a diverticular abscess.  Pelvis: Urinary bladder has a normal appearance. There is no free pelvic fluid. Significant pelvic mesentery inflammatory change.  Retroperitoneum: Small retroperitoneal and mesenteric lymph nodes are likely reactive. Small locules of gas throughout the mesentery.  Abdominal wall: Unremarkable.  Osseous structures: Unremarkable.  IMPRESSION: 1. Abscess within the lower central abdomen has increased in size, 5.8 cm in diameter. 2. Interval perforation with small locules of gas seen throughout the mesentery and within the upper abdomen. 3. Thickened terminal ileum and distended appendix, felt to be secondary to the abscess. 4. Significant sigmoid diverticular disease, likely the source of the abscess. 5. The salient findings were discussed with Dr. Lunette Stands on 10/18/2014 at 10:24 am.   Electronically Signed   By: Nolon Nations M.D.   On: 10/18/2014 10:25   Ct Image Guided Drainage By Percutaneous Catheter  10/19/2014   CLINICAL DATA:  44 year old male with enlarging diverticular abscess despite IV  antibiotic therapy. The abscess is centered within the ileal mesentery and nearly completely surrounded by bowel. Given worsening of the abscess collection, an attempt will be made at percutaneous drain placement if a window can be  identified.  EXAM: CT IMAGE GUIDED DRAINAGE BY PERCUTANEOUS CATHETER  Date: 10/19/2014  PROCEDURE: 1. CT-guided placement of 12 French abscess drain Interventional Radiologist:  Criselda Peaches, MD  ANESTHESIA/SEDATION: Moderate (conscious) sedation was used. 3 mg Versed, 150 mcg Fentanyl were administered intravenously. The patient's vital signs were monitored continuously by radiology nursing throughout the procedure.  Sedation Time: 32 minutes  MEDICATIONS: None additional  TECHNIQUE: Informed consent was obtained from the patient following explanation of the procedure, risks, benefits and alternatives. The patient understands, agrees and consents for the procedure. All questions were addressed. A time out was performed.  A planning axial CT scan was performed. The abscess collection was identified. There is a very small when no posterior to the left rectus abdominis muscle. An appropriate skin entry site was selected and marked. The region was then sterilely prepped and draped in standard fashion using Betadine skin prep.  Under intermittent CT fluoroscopic guidance, first a 22 gauge spinal needle was advanced just through the posterior fascia of the left rectus abdominis muscle. Peristalsing loops of adjacent small bowel were intermittently noted within the planned trajectory. Therefore, hydro dissection was carried out using approximately 50 mL sterile saline. This sufficiently displaced the loops of small bowel to allow for safe advancement of a 22 gauge Accustick needle into the fluid and gas collection. Needle advancement was performed under intermittent CT fluoroscopy. A 0.018 inch wire was then coiled within the fluid cavity and the needle exchanged for the Accustick sheath. A 0.035 inch Amplatz wire was then advanced into the fluid cavity. The tract was then dilated to 54 [place] and a Cook 12 [place] all-purpose drainage catheter advanced over the wire and formed within the abscess.  Approximately 40  mL of thick, purulent material was aspirated. The tube was secured to the skin with 0 Prolene suture. The tube was flushed and connected to JP bulb suction. The patient tolerated the procedure well.  COMPLICATIONS: None  IMPRESSION: 1. Technically successful placement of a 12 French percutaneous drainage catheter into the diverticular abscess. Hydro dissection was utilized to facilitate a safe trajectory. 2. Aspiration yielded 40 mL frankly purulent material.  PLAN: 1. Maintain to JP bulb suction until drained decreases to less than 20 cc daily. 2. Flush TID until drainage decreases to less than 20 cc daily 3. Once drainage decreases to less than 20 cc daily, recommend drain injection under fluoroscopy to evaluate for fistulous connection to the adjacent colon prior to tube removal. 4. Patient can be followed in the IR outpatient [facility]. Recommend first visit in 2 weeks following discharge. Signed,  Criselda Peaches, MD  [facility]  [facility]   Electronically Signed   By: Jacqulynn Cadet M.D.   On: 10/19/2014 15:33    Labs:  CBC:  Recent Labs  10/14/14 0245 10/15/14 0733 10/17/14 0445 10/20/14 0533  WBC 8.0 6.3 9.4 7.9  HGB 12.5* 12.9* 13.3 12.8*  HCT 36.7* 39.5 40.6 39.2  PLT 196 216 244 276    COAGS:  Recent Labs  10/14/14 0245  INR 1.20    BMP:  Recent Labs  10/14/14 0245 10/15/14 0733 10/17/14 0445 10/20/14 0533  NA 137 138 138 138  K 3.4* 4.2 4.1 4.0  CL 104 107  104 105  CO2 22 20 25 21   GLUCOSE 105* 78 94 81  BUN 15 15 <5* 12  CALCIUM 8.1* 8.3* 8.3* 8.4  CREATININE 0.97 0.87 1.00 0.89  GFRNONAA >90 >90 >90 >90  GFRAA >90 >90 >90 >90    LIVER FUNCTION TESTS:  Recent Labs  10/12/14 1200 10/13/14 1917 10/14/14 0245 10/17/14 0445  BILITOT 0.8 0.8 0.7 0.5  AST 12 33 29 14  ALT 17 28 31 19   ALKPHOS 56 61 52 57  PROT 8.7* 8.2 7.0 7.1  ALBUMIN 4.4 3.7 3.1* 3.0*    Assessment and Plan:  LLQ  divertic abscess drain placed 2/15 Will follow When output less than 15 cc/day Will need drain injection before pull  Signed: Kam Kushnir A 10/21/2014, 7:18 AM   I spent a total of 15 minutes in face to face in clinical consultation/evaluation, greater than 50% of which was counseling/coordinating care for abscess drain

## 2014-10-21 NOTE — Progress Notes (Signed)
CARE MANAGEMENT NOTE 10/21/2014  Patient:  Edward Adkins, Edward Adkins   Account Number:  [id number]  Date Initiated:  10/16/2014  Documentation initiated by:  ROYAL,CHERYL  Subjective/Objective Assessment:   CM following for progression and d/c planning.  hx cerebral palsy     Action/Plan:   Plan is to return to home no d/c needs identified.  lives at home with father, Edward Adkins [phone]   Anticipated DC Date:  10/18/2014   Anticipated DC Plan:  [place]  CM consult      Choice offered to / List presented to:             Status of service:  Completed, signed off Medicare Important Message given?  YES (If response is "NO", the following Medicare IM given date fields will be blank) Date Medicare IM given:  10/16/2014 Medicare IM given by:  ROYAL,CHERYL Date Additional Medicare IM given:  10/19/2014 Additional Medicare IM given by:  Edward Adkins  Discharge Disposition:  [place]  Per UR Regulation:    If discussed at Long Length of Stay Meetings, dates discussed:    Comments:  10/21/2014 1400 Attempted call to father, Edward Adkins. Left message on vm for return call. Pt will need HHRN to follow up on drain. NCM will continue to follow to arrange [facility] Edward Finner RN CCM Case Mgmt phone [phone]  10/19/2014 1200 pm NCM spoke to pt and state he lives at home with his father, Edward Adkins. Pt going for procedure today. Will continue to follow for dc needs. Edward Finner RN  CCM Case Mgmt phone [phone]

## 2014-10-21 NOTE — Progress Notes (Signed)
Patient ID: Edward Adkins, male   DOB: 10/28/1970, 44 y.o.   MRN: [id number]  [facility] PROGRESS NOTE  Edward Adkins RWE:[id number] DOB: 02-Jun-1971 DOA: 10/13/2014 PCP: [name], MD   Brief narrative:    44 y.o. male with cerebral palsy with complex partial seizure who presented with complaints of abdominal pain and was found to have acute sigmoid diverticulitis with perforation and small diverticular abscess 4 cm in diameter. CT abdomen and pelvis notable for Contained bowel perforation in the low abdomen with 4 cm abscess. A diverticular source is favored over ruptured appendix. Pt started on Zosyn and WBC improved from 17 K --> 8 K. Interventional radiology initially felt abscess not amenable to drainage but repeat CT abd with increasing size of the abscess. Surgery consulted for assistance.   Assessment/Plan:     Diverticulitis of intestine with microperforation and abscess vs crohn's terminal ileitis  - drain placed by IR 10/19/2014. - per surgery team, will need repeat CT scan in ~  when output decreases -continue with zosyn D#7 -  advance to soft diet  - will need colonoscopy on OP basis  - continue Zosyn day #8  Complex partial seizures/Hyperlipidemia/Cerebral palsy - Stable continue home regimen with carbamazepine  - No seizures while hospitalized  Heparin SQ for DVT prophylaxis   Code Status: Full.  Family Communication: plan of care discussed with the patient Disposition Plan: D/C in am if tolerates diet   IV access:  Peripheral IV  Procedures and diagnostic studies:    Ct Abdomen Pelvis W Contrast 10/18/2014 Abscess within the lower central abdomen has increased in size, 5.8 cm in diameter. 2. Interval perforation with small locules of gas seen throughout the mesentery and within the upper abdomen. 3. Thickened terminal ileum and distended appendix, felt to be secondary to the abscess. 4. Significant sigmoid diverticular disease, likely the source of  the abscess  Ct Abdomen Pelvis W Contrast 10/13/2014 Contained bowel perforation in the low abdomen with 4 cm abscess. A diverticular source is favored over ruptured appendix.  Medical Consultants:  Surgery  IR  Other Consultants:  None   IAnti-Infectives:   Zosyn 2/10 -->  Reyne Dumas, MD  [facility] Pager [phone]  If 7PM-7AM, please contact night-coverage [email or web address] Password [facility] 10/21/2014, 6:22 PM   LOS: 7 days   HPI/Subjective: no more N/V, had BM, no pain , wants to advance diet, father by the bedside   Objective: Filed Vitals:   10/20/14 1813 10/20/14 2100 10/21/14 0500 10/21/14 1242  BP: 114/70 105/71 100/66 106/73  Pulse: 76 73 65 65  Temp: 98.2 F (36.8 C) 98.4 F (36.9 C) 98 F (36.7 C) 98.6 F (37 C)  TempSrc: Oral Oral Oral Oral  Resp: 18 18 18 18   Height:      Weight:  96.9 kg (213 lb 10 oz)    SpO2: 63% 99% 98% 98%    Intake/Output Summary (Last 24 hours) at 10/21/14 1822 Last data filed at 10/21/14 1242  Gross per 24 hour  Intake    250 ml  Output     30 ml  Net    220 ml    Exam:   General:  Pt is alert, follows commands appropriately, not in acute distress  Cardiovascular: Regular rate and rhythm, S1/S2, no murmurs, no rubs, no gallops  Respiratory: Clear to auscultation bilaterally, no wheezing, no crackles, no rhonchi  Abdomen: Soft,  non distended, bowel sounds present, no guarding  Extremities: No edema, pulses DP and  PT palpable bilaterally  Neuro: Grossly nonfocal  Data Reviewed: Basic Metabolic Panel:  Recent Labs Lab 10/15/14 0733 10/17/14 0445 10/20/14 0533  NA 138 138 138  K 4.2 4.1 4.0  CL 107 104 105  CO2 20 25 21   GLUCOSE 78 94 81  BUN 15 <5* 12  CREATININE 0.87 1.00 0.89  CALCIUM 8.3* 8.3* 8.4   Liver Function Tests:  Recent Labs Lab 10/17/14 0445  AST 14  ALT 19  ALKPHOS 57  BILITOT 0.5  PROT 7.1  ALBUMIN 3.0*   No results for input(s): LIPASE, AMYLASE in the last 168 hours. CBC:  Recent  Labs Lab 10/15/14 0733 10/17/14 0445 10/20/14 0533  WBC 6.3 9.4 7.9  HGB 12.9* 13.3 12.8*  HCT 39.5 40.6 39.2  MCV 91.9 92.3 92.9  PLT 216 244 276    Recent Results (from the past 240 hour(s))  Culture, routine-abscess     Status: None (Preliminary result)   Collection Time: 10/19/14  2:54 PM  Result Value Ref Range Status   Specimen Description ABSCESS ABDOMEN  Final   Special Requests Normal  Final   Gram Stain   Final    ABUNDANT WBC PRESENT,BOTH PMN AND MONONUCLEAR NO SQUAMOUS EPITHELIAL CELLS SEEN NO ORGANISMS SEEN Performed at [employer]   Final    Culture reincubated for better growth Performed at [facility]    Report Status PENDING  Incomplete     Scheduled Meds: . carbamazepine  200 mg Oral TID  . famotidine  20 mg Oral QHS  . heparin  5,000 Units Subcutaneous 3 times per day  . piperacillin-tazobactam (ZOSYN)  IV  3.375 g Intravenous 3 times per day   Continuous Infusions: . sodium chloride 10 mL/hr at 10/16/14 1534

## 2014-10-21 NOTE — Progress Notes (Addendum)
Subjective: Sitting up eating clears, no more N/V, had BM, no pain since Monday  Objective: Vital signs in last 24 hours: Temp:  [98 F (36.7 C)-98.5 F (36.9 C)] 98 F (36.7 C) (02/17 0500) Pulse Rate:  [65-88] 65 (02/17 0500) Resp:  [18] 18 (02/17 0500) BP: (100-120)/(66-71) 100/66 mmHg (02/17 0500) SpO2:  [63 %-99 %] 98 % (02/17 0500) Weight:  [213 lb 10 oz (96.9 kg)] 213 lb 10 oz (96.9 kg) (02/16 2100) Last BM Date: 10/20/14  Intake/Output from previous day: 02/16 0701 - 02/17 0700 In: 615 [P.O.:600] Out: 50 [Drains:50] Intake/Output this shift:    General appearance: alert and cooperative Resp: clear to auscultation bilaterally Cardio: regular rate and rhythm GI: soft, NT, drain with cloudy serosanguinous  Lab Results:   Recent Labs  10/20/14 0533  WBC 7.9  HGB 12.8*  HCT 39.2  PLT 276   BMET  Recent Labs  10/20/14 0533  NA 138  K 4.0  CL 105  CO2 21  GLUCOSE 81  BUN 12  CREATININE 0.89  CALCIUM 8.4   PT/INR No results for input(s): LABPROT, INR in the last 72 hours. ABG No results for input(s): PHART, HCO3 in the last 72 hours.  Invalid input(s): PCO2, PO2  Studies/Results: Ct Image Guided Drainage By Percutaneous Catheter  10/19/2014   CLINICAL DATA:  44 year old male with enlarging diverticular abscess despite IV antibiotic therapy. The abscess is centered within the ileal mesentery and nearly completely surrounded by bowel. Given worsening of the abscess collection, an attempt will be made at percutaneous drain placement if a window can be identified.  EXAM: CT IMAGE GUIDED DRAINAGE BY PERCUTANEOUS CATHETER  Date: 10/19/2014  PROCEDURE: 1. CT-guided placement of 12 French abscess drain Interventional Radiologist:  Criselda Peaches, MD  ANESTHESIA/SEDATION: Moderate (conscious) sedation was used. 3 mg Versed, 150 mcg Fentanyl were administered intravenously. The patient's vital signs were monitored continuously by radiology nursing throughout  the procedure.  Sedation Time: 32 minutes  MEDICATIONS: None additional  TECHNIQUE: Informed consent was obtained from the patient following explanation of the procedure, risks, benefits and alternatives. The patient understands, agrees and consents for the procedure. All questions were addressed. A time out was performed.  A planning axial CT scan was performed. The abscess collection was identified. There is a very small when no posterior to the left rectus abdominis muscle. An appropriate skin entry site was selected and marked. The region was then sterilely prepped and draped in standard fashion using Betadine skin prep.  Under intermittent CT fluoroscopic guidance, first a 22 gauge spinal needle was advanced just through the posterior fascia of the left rectus abdominis muscle. Peristalsing loops of adjacent small bowel were intermittently noted within the planned trajectory. Therefore, hydro dissection was carried out using approximately 50 mL sterile saline. This sufficiently displaced the loops of small bowel to allow for safe advancement of a 22 gauge Accustick needle into the fluid and gas collection. Needle advancement was performed under intermittent CT fluoroscopy. A 0.018 inch wire was then coiled within the fluid cavity and the needle exchanged for the Accustick sheath. A 0.035 inch Amplatz wire was then advanced into the fluid cavity. The tract was then dilated to 22 [place] and a Cook 12 [place] all-purpose drainage catheter advanced over the wire and formed within the abscess.  Approximately 40 mL of thick, purulent material was aspirated. The tube was secured to the skin with 0 Prolene suture. The tube was flushed and connected to JP bulb  suction. The patient tolerated the procedure well.  COMPLICATIONS: None  IMPRESSION: 1. Technically successful placement of a 12 French percutaneous drainage catheter into the diverticular abscess. Hydro dissection was utilized to facilitate a safe trajectory. 2.  Aspiration yielded 40 mL frankly purulent material.  PLAN: 1. Maintain to JP bulb suction until drained decreases to less than 20 cc daily. 2. Flush TID until drainage decreases to less than 20 cc daily 3. Once drainage decreases to less than 20 cc daily, recommend drain injection under fluoroscopy to evaluate for fistulous connection to the adjacent colon prior to tube removal. 4. Patient can be followed in the IR outpatient [facility]. Recommend first visit in 2 weeks following discharge. Signed,  Criselda Peaches, MD  [facility]  [facility]   Electronically Signed   By: Jacqulynn Cadet M.D.   On: 10/19/2014 15:33    Anti-infectives: Anti-infectives    Start     Dose/Rate Route Frequency Ordered Stop   10/14/14 0600  piperacillin-tazobactam (ZOSYN) IVPB 3.375 g     3.375 g 12.5 mL/hr over 240 Minutes Intravenous 3 times per day 10/14/14 0308     10/13/14 2230  piperacillin-tazobactam (ZOSYN) IVPB 3.375 g     3.375 g 100 mL/hr over 30 Minutes Intravenous  Once 10/13/14 2219 10/13/14 2355      Assessment/Plan:  Sigmoid diverticulitis with microperforation and abscess vs crohn's terminal ileitis with abscess -continue with zosyn D#7, CX P -drain placed by IR 2/15. F/U CT once output decreases, maybe as outpatient. -heparin/SCDs -advance to full liquids -will need colonoscopy on OP basis   LOS: 7 days    Joangel Vanosdol E 10/21/2014

## 2014-10-22 ENCOUNTER — Telehealth: Payer: Self-pay | Admitting: Family Medicine

## 2014-10-22 MED ORDER — HYDROCODONE-ACETAMINOPHEN 5-325 MG PO TABS: 1.0000 | ORAL_TABLET | Freq: Four times a day (QID) | ORAL | Status: DC | PRN

## 2014-10-22 MED ORDER — AMOXICILLIN-POT CLAVULANATE 875-125 MG PO TABS: 1.0000 | ORAL_TABLET | Freq: Two times a day (BID) | ORAL | Status: AC

## 2014-10-22 NOTE — Care Management Note (Signed)
    Page 1 of 2   10/22/2014     2:38:49 PM CARE MANAGEMENT NOTE 10/22/2014  Patient:  Edward Adkins, Edward Adkins   Account Number:  [id number]  Date Initiated:  10/16/2014  Documentation initiated by:  ROYAL,CHERYL  Subjective/Objective Assessment:   CM following for progression and d/c planning.  hx cerebral palsy     Action/Plan:   Plan is to return to home no d/c needs identified.  lives at home with father, [place] Rowe [phone]   Anticipated DC Date:  10/22/2014   Anticipated DC Plan:  [place]  CM consult      Choice offered to / List presented to:  C-6 Parent        HH arranged  HH-1 RN      [place].   Status of service:  Completed, signed off Medicare Important Message given?  YES (If response is "NO", the following Medicare IM given date fields will be blank) Date Medicare IM given:  10/22/2014 Medicare IM given by:  Magdalen Spatz Date Additional Medicare IM given:  10/19/2014 Additional Medicare IM given by:  Jonnie Finner  Discharge Disposition:  [place]  Per UR Regulation:  Reviewed for med. necessity/level of care/duration of stay  If discussed at [place] of Stay Meetings, dates discussed:    Comments:  10-22-14 Spoke with patient and his father at bedside. Confirmed face sheet information. Magdalen Spatz RN BSN    10/21/2014 1400 Attempted call to father, Viliami Bracco. Left message on vm for return call. Pt will need HHRN to follow up on drain. NCM will continue to follow to arrange [facility]. Jonnie Finner RN CCM Case Mgmt phone [phone]  10/19/2014 1200 pm NCM spoke to pt and state he lives at home with his father, Marky Buresh. Pt going for procedure today. Will continue to follow for dc needs. Jonnie Finner RN  CCM Case Mgmt phone [phone]

## 2014-10-22 NOTE — Progress Notes (Signed)
Patient ID: Edward Adkins, male   DOB: September 04, 1971, 44 y.o.   MRN: [id number]    Subjective: Pt feels well today.  Ate solid breakfast this morning with no issues.  No abdominal pain  Objective: Vital signs in last 24 hours: Temp:  [97.8 F (36.6 C)-99.4 F (37.4 C)] 97.8 F (36.6 C) (02/18 0824) Pulse Rate:  [65-79] 70 (02/18 0824) Resp:  [16-20] 16 (02/18 0824) BP: (104-112)/(67-75) 109/75 mmHg (02/18 0824) SpO2:  [97 %-99 %] 97 % (02/18 0824) Weight:  [213 lb 3 oz (96.7 kg)] 213 lb 3 oz (96.7 kg) (02/17 1951) Last BM Date: 10/20/14  Intake/Output from previous day: 02/17 0701 - 02/18 0700 In: 48 [P.O.:480] Out: 8 [Drains:8] Intake/Output this shift:    PE: Abd: soft, NT, ND, +BS, JP with scant bloody output  Lab Results:   Recent Labs  10/20/14 0533  WBC 7.9  HGB 12.8*  HCT 39.2  PLT 276   BMET  Recent Labs  10/20/14 0533  NA 138  K 4.0  CL 105  CO2 21  GLUCOSE 81  BUN 12  CREATININE 0.89  CALCIUM 8.4   PT/INR No results for input(s): LABPROT, INR in the last 72 hours. CMP     Component Value Date/Time   NA 138 10/20/2014 0533   NA 143 08/06/2014 1426   K 4.0 10/20/2014 0533   CL 105 10/20/2014 0533   CO2 21 10/20/2014 0533   GLUCOSE 81 10/20/2014 0533   GLUCOSE 88 08/06/2014 1426   BUN 12 10/20/2014 0533   BUN 11 08/06/2014 1426   CREATININE 0.89 10/20/2014 0533   CALCIUM 8.4 10/20/2014 0533   PROT 7.1 10/17/2014 0445   PROT 7.9 08/06/2014 1426   ALBUMIN 3.0* 10/17/2014 0445   AST 14 10/17/2014 0445   ALT 19 10/17/2014 0445   ALKPHOS 57 10/17/2014 0445   BILITOT 0.5 10/17/2014 0445   GFRNONAA >90 10/20/2014 0533   GFRAA >90 10/20/2014 0533   Lipase     Component Value Date/Time   LIPASE 24 10/13/2014 1917       Studies/Results: No results found.  Anti-infectives: Anti-infectives    Start     Dose/Rate Route Frequency Ordered Stop   10/14/14 0600  piperacillin-tazobactam (ZOSYN) IVPB 3.375 g     3.375 g 12.5 mL/hr over  240 Minutes Intravenous 3 times per day 10/14/14 0308     10/13/14 2230  piperacillin-tazobactam (ZOSYN) IVPB 3.375 g     3.375 g 100 mL/hr over 30 Minutes Intravenous  Once 10/13/14 2219 10/13/14 2355       Assessment/Plan  Sigmoid diverticulitis with microperforation and abscess vs crohn's terminal ileitis with abscess -zosyn D#8, CX still pending, but shows multiple organisms, none predominant.  Would likely benefit from another 7-10 days of oral augmentin or Cirpo/Flagyl at discharge -drain placed by IR 2/15. F/U CT once output decreases, as outpatient.  I have spoken to Edward Adkins, who will arrange IR clinic follow up -heparin/SCDs -tolerating a low fiber diet -will need colonoscopy on OP basis   -ok for DC home from our standpoint.  Arranging follow up in our office in 2 weeks with Dr. Ninfa Linden.  LOS: 8 days    Edward Adkins 10/22/2014, 8:48 AM Pager: [phone]

## 2014-10-22 NOTE — Discharge Summary (Addendum)
Physician Discharge Summary  Edward Adkins MRN: [id number] DOB/AGE: [birth year] 42 y.o.  PCP: [name], MD   Admit date: 10/13/2014 Discharge date: 10/22/2014  Discharge Diagnoses:     Diverticulitis of intestine with perforation Active Problems:   Complex partial seizures   Hyperlipidemia   Cerebral palsy   Diverticulitis large intestine   Abscess   Intra-abdominal abscess   F/U CT once output decreases, as outpatient Follow-up with interventional radiology,Pam Nonie Hoyer, who will arrange IR clinic follow up Follow up in 2 weeks with general surgery, Dr. Ninfa Linden Follow-up with PCP in 5-7 days Home health being arranged for for drain care    Medication List    STOP taking these medications        ciprofloxacin 500 MG tablet  Commonly known as:  CIPRO      TAKE these medications        amoxicillin-clavulanate 875-125 MG per tablet  Commonly known as:  AUGMENTIN  Take 1 tablet by mouth 2 (two) times daily.     carbamazepine 200 MG tablet  Commonly known as:  TEGRETOL  Take 1 tablet (200 mg total) by mouth 3 (three) times daily.     HYDROcodone-acetaminophen 5-325 MG per tablet  Commonly known as:  NORCO/VICODIN  Take 1 tablet by mouth every 6 (six) hours as needed for moderate pain.     multivitamin tablet  Take 2 tablets by mouth daily.        Discharge Condition: Stable Disposition: Home with home health   Consults:  General surgery Interventional radiology    Significant Diagnostic Studies: Ct Abdomen Pelvis W Contrast  10/18/2014   CLINICAL DATA:  Abdominal pain. Umbilical area with fever. Pain on left side of the abdomen. Pain started 10/10/2014  EXAM: CT ABDOMEN AND PELVIS WITH CONTRAST  TECHNIQUE: Multidetector CT imaging of the abdomen and pelvis was performed using the standard protocol following bolus administration of intravenous contrast.  CONTRAST:  114mL OMNIPAQUE IOHEXOL 300 MG/ML  SOLN  COMPARISON:  10/13/2014  FINDINGS:  Lower chest: The lung bases are clear.  Heart size is normal.  Upper abdomen: There is a small amount of free intraperitoneal air. This is a new finding since the prior study. Stable small low-attenuation lesions are identified within the liver, likely representing cyst. No focal abnormality identified within the spleen, pancreas, or adrenal glands. The kidneys have a normal appearance. Gallbladder is present and is collapsed.  Gastrointestinal tract: The stomach has a normal appearance. Within the lower central abdomen there is an abscess measuring 5.8 x 4.3 cm. The previously this measured 3.6 cm. There is significant surrounding mesenteric inflammation. The terminal ileum is immediately anterior to this abscess and demonstrates a thickened wall. The base of the appendix is immediately adjacent to the abscess. The appendix is distended. However there is little inflammation around the distal tip of the appendix and the appearance is favored to represent a secondary process. The sigmoid colon continues is show significant inflammation as well as numerous diverticula. On the previous exam contrast extended towards the abscess from the sigmoid colon, suggesting a diverticular abscess.  Pelvis: Urinary bladder has a normal appearance. There is no free pelvic fluid. Significant pelvic mesentery inflammatory change.  Retroperitoneum: Small retroperitoneal and mesenteric lymph nodes are likely reactive. Small locules of gas throughout the mesentery.  Abdominal wall: Unremarkable.  Osseous structures: Unremarkable.  IMPRESSION: 1. Abscess within the lower central abdomen has increased in size, 5.8 cm in diameter. 2. Interval perforation with  small locules of gas seen throughout the mesentery and within the upper abdomen. 3. Thickened terminal ileum and distended appendix, felt to be secondary to the abscess. 4. Significant sigmoid diverticular disease, likely the source of the abscess. 5. The salient findings were  discussed with Dr. Lunette Stands on 10/18/2014 at 10:24 am.   Electronically Signed   By: Nolon Nations M.D.   On: 10/18/2014 10:25   Ct Abdomen Pelvis W Contrast  10/13/2014   CLINICAL DATA:  Low abdominal pain and fever.  EXAM: CT ABDOMEN AND PELVIS WITH CONTRAST  TECHNIQUE: Multidetector CT imaging of the abdomen and pelvis was performed using the standard protocol following bolus administration of intravenous contrast.  CONTRAST:  17mL OMNIPAQUE IOHEXOL 300 MG/ML  SOLN  COMPARISON:  None.  FINDINGS: BODY WALL: Unremarkable.  LOWER CHEST: Unremarkable.  ABDOMEN/PELVIS:  Liver: Sub cm low-density liver likely reflects cysts.  Biliary: No evidence of biliary obstruction or stone.  Pancreas: Unremarkable.  Spleen: Unremarkable.  Adrenals: Unremarkable.  Kidneys and ureters: No hydronephrosis or stone.  Bladder: Unremarkable.  Reproductive: Unremarkable.  Bowel: Bowel perforation in the low mid abdomen with 4 cm gas and fluid containing abscess that also contains oral contrast. A tract of oral contrast extends towards the sigmoid colon, favoring a diverticular perforation. The appendix base is contiguous with the abscess and is overdistended and avidly enhancing. While the appendix is likely obstructed, the inflammatory changes around the tip and mid portion are not as extensive as expected for primary appendicitis. There is terminal ileitis with circumferential submucosal edema, likely secondary. Bowel encompasses most of the abscess circumference, making percutaneous drainage difficult.  Retroperitoneum: No mass or adenopathy.  Peritoneum: No ascites or pneumoperitoneum.  Vascular: No acute abnormality.  OSSEOUS: No acute abnormalities.  These results were called by telephone at the time of interpretation on 10/13/2014 at 4:00 pm to Dr. [name] , who verbally acknowledged these results.  IMPRESSION: Contained bowel perforation in the low abdomen with 4 cm abscess. A diverticular source is favored over  ruptured appendix, as reasoned above.   Electronically Signed   By: Monte Fantasia M.D.   On: 10/13/2014 16:04   Ct Image Guided Drainage By Percutaneous Catheter  10/19/2014   CLINICAL DATA:  44 year old male with enlarging diverticular abscess despite IV antibiotic therapy. The abscess is centered within the ileal mesentery and nearly completely surrounded by bowel. Given worsening of the abscess collection, an attempt will be made at percutaneous drain placement if a window can be identified.  EXAM: CT IMAGE GUIDED DRAINAGE BY PERCUTANEOUS CATHETER  Date: 10/19/2014  PROCEDURE: 1. CT-guided placement of 12 French abscess drain Interventional Radiologist:  Criselda Peaches, MD  ANESTHESIA/SEDATION: Moderate (conscious) sedation was used. 3 mg Versed, 150 mcg Fentanyl were administered intravenously. The patient's vital signs were monitored continuously by radiology nursing throughout the procedure.  Sedation Time: 32 minutes  MEDICATIONS: None additional  TECHNIQUE: Informed consent was obtained from the patient following explanation of the procedure, risks, benefits and alternatives. The patient understands, agrees and consents for the procedure. All questions were addressed. A time out was performed.  A planning axial CT scan was performed. The abscess collection was identified. There is a very small when no posterior to the left rectus abdominis muscle. An appropriate skin entry site was selected and marked. The region was then sterilely prepped and draped in standard fashion using Betadine skin prep.  Under intermittent CT fluoroscopic guidance, first a 22 gauge spinal needle was advanced just through  the posterior fascia of the left rectus abdominis muscle. Peristalsing loops of adjacent small bowel were intermittently noted within the planned trajectory. Therefore, hydro dissection was carried out using approximately 50 mL sterile saline. This sufficiently displaced the loops of small bowel to allow  for safe advancement of a 22 gauge Accustick needle into the fluid and gas collection. Needle advancement was performed under intermittent CT fluoroscopy. A 0.018 inch wire was then coiled within the fluid cavity and the needle exchanged for the Accustick sheath. A 0.035 inch Amplatz wire was then advanced into the fluid cavity. The tract was then dilated to 46 [place] and a Cook 12 [place] all-purpose drainage catheter advanced over the wire and formed within the abscess.  Approximately 40 mL of thick, purulent material was aspirated. The tube was secured to the skin with 0 Prolene suture. The tube was flushed and connected to JP bulb suction. The patient tolerated the procedure well.  COMPLICATIONS: None  IMPRESSION: 1. Technically successful placement of a 12 French percutaneous drainage catheter into the diverticular abscess. Hydro dissection was utilized to facilitate a safe trajectory. 2. Aspiration yielded 40 mL frankly purulent material.  PLAN: 1. Maintain to JP bulb suction until drained decreases to less than 20 cc daily. 2. Flush TID until drainage decreases to less than 20 cc daily 3. Once drainage decreases to less than 20 cc daily, recommend drain injection under fluoroscopy to evaluate for fistulous connection to the adjacent colon prior to tube removal. 4. Patient can be followed in the IR outpatient [facility]. Recommend first visit in 2 weeks following discharge. Signed,  Criselda Peaches, MD  [facility]  [facility]   Electronically Signed   By: Jacqulynn Cadet M.D.   On: 10/19/2014 15:33      Microbiology: Recent Results (from the past 240 hour(s))  Culture, routine-abscess     Status: None (Preliminary result)   Collection Time: 10/19/14  2:54 PM  Result Value Ref Range Status   Specimen Description ABSCESS ABDOMEN  Final   Special Requests Normal  Final   Gram Stain   Final    ABUNDANT WBC PRESENT,BOTH PMN AND MONONUCLEAR NO  SQUAMOUS EPITHELIAL CELLS SEEN NO ORGANISMS SEEN Performed at [facility]    Culture   Final    MULTIPLE ORGANISMS PRESENT, NONE PREDOMINANT Performed at [facility]    Report Status PENDING  Incomplete     Labs: No results found for this or any previous visit (from the past 66 hour(s)).    44 y.o. male with cerebral palsy with complex partial seizure who presented with complaints of abdominal pain and was found to have acute sigmoid diverticulitis with perforation and small diverticular abscess 4 cm in diameter. CT abdomen and pelvis notable for Contained bowel perforation in the low abdomen with 4 cm abscess. A diverticular source is favored over ruptured appendix. Pt started on Zosyn and WBC improved from 17 K --> 8 K. Interventional radiology initially felt abscess not amenable to drainage but repeat CT abd with increasing size of the abscess. Surgery consulted for assistance.   Assessment/Plan:     Diverticulitis of intestine with microperforation and abscess vs crohn's terminal ileitis  Initial CT scan 2/9 showed Contained bowel perforation in the low abdomen with 4 cm abscess Repeat CT scan 2/14, showed abscess within the lower central abdomen which is increased in size with interval perforation with small locules of gas seen throughout the mesentery. - drain placed  by IR 10/19/2014. - per surgery team, will need repeat CT scan in ~ when output decreases Received zosyn 8 days, patient to continue with Augmentin for another 10 days Tolerated soft diet  - will need colonoscopy on OP basis  F/U CT once output decreases, as outpatient. Interventional radiology, Jannifer Franklin,  will arrange IR clinic follow up for drain care  Complex partial seizures/Hyperlipidemia/Cerebral palsy - Stable continue home regimen with carbamazepine  - No seizures while hospitalized  Heparin SQ for DVT prophylaxis   Code Status: Full.       Discharge Exam:   Blood  pressure 109/75, pulse 70, temperature 97.8 F (36.6 C), temperature source Axillary, resp. rate 16, height 6\' 4"  (1.93 m), weight 96.7 kg (213 lb 3 oz), SpO2 97 %.   General: Pt is alert, follows commands appropriately, not in acute distress  Cardiovascular: Regular rate and rhythm, S1/S2, no murmurs, no rubs, no gallops  Respiratory: Clear to auscultation bilaterally, no wheezing, no crackles, no rhonchi  Abdomen: Soft, non distended, bowel sounds present, no guarding  Extremities: No edema, pulses DP and PT palpable bilaterally  Neuro: Grossly nonfocal       Discharge Instructions    Diet - low sodium heart healthy    Complete by:  As directed      Increase activity slowly    Complete by:  As directed            Follow-up Information    Follow up with Harl Bowie, MD On 11/05/2014.   Specialty:  General Surgery   Why:  9:50am, arrive no later than 9:20am for paperwork   Contact information:   [place] [facility] [address] [phone]       Follow up with [name], MD. Schedule an appointment as soon as possible for a visit in 1 week.   Specialty:  Family Medicine   Contact information:   [place] [address] [phone]       Signed: Reyne Dumas 10/22/2014, 1:53 PM

## 2014-10-22 NOTE — Progress Notes (Signed)
Referring Physician(s): CCS  Subjective:  LLQ divertic abscess drain placed 2/15 Doing well Better daily  Allergies: Phenobarbital  Medications: Prior to Admission medications   Medication Sig Start Date End Date Taking? Authorizing Provider  carbamazepine (TEGRETOL) 200 MG tablet Take 1 tablet (200 mg total) by mouth 3 (three) times daily. 08/06/14  Yes Dennie Bible, NP  ciprofloxacin (CIPRO) 500 MG tablet Take 1 tablet (500 mg total) by mouth 2 (two) times daily. 10/12/14  Yes [name], MD  Multiple Vitamin (MULTIVITAMIN) tablet Take 2 tablets by mouth daily.    Yes Historical Provider, MD     Vital Signs: BP 109/75 mmHg  Pulse 70  Temp(Src) 97.8 F (36.6 C) (Axillary)  Resp 16  Ht 6\' 4"  (1.93 m)  Wt 96.7 kg (213 lb 3 oz)  BMI 25.96 kg/m2  SpO2 97%  Physical Exam  Abdominal:  Site is clean and dry NT today No bleeding Output 10 cc yesterday Minimal in JP now Bloody No organisms Afeb; wbc wnl    Imaging: Ct Abdomen Pelvis W Contrast  10/18/2014   CLINICAL DATA:  Abdominal pain. Umbilical area with fever. Pain on left side of the abdomen. Pain started 10/10/2014  EXAM: CT ABDOMEN AND PELVIS WITH CONTRAST  TECHNIQUE: Multidetector CT imaging of the abdomen and pelvis was performed using the standard protocol following bolus administration of intravenous contrast.  CONTRAST:  115mL OMNIPAQUE IOHEXOL 300 MG/ML  SOLN  COMPARISON:  10/13/2014  FINDINGS: Lower chest: The lung bases are clear.  Heart size is normal.  Upper abdomen: There is a small amount of free intraperitoneal air. This is a new finding since the prior study. Stable small low-attenuation lesions are identified within the liver, likely representing cyst. No focal abnormality identified within the spleen, pancreas, or adrenal glands. The kidneys have a normal appearance. Gallbladder is present and is collapsed.  Gastrointestinal tract: The stomach has a normal appearance. Within the lower  central abdomen there is an abscess measuring 5.8 x 4.3 cm. The previously this measured 3.6 cm. There is significant surrounding mesenteric inflammation. The terminal ileum is immediately anterior to this abscess and demonstrates a thickened wall. The base of the appendix is immediately adjacent to the abscess. The appendix is distended. However there is little inflammation around the distal tip of the appendix and the appearance is favored to represent a secondary process. The sigmoid colon continues is show significant inflammation as well as numerous diverticula. On the previous exam contrast extended towards the abscess from the sigmoid colon, suggesting a diverticular abscess.  Pelvis: Urinary bladder has a normal appearance. There is no free pelvic fluid. Significant pelvic mesentery inflammatory change.  Retroperitoneum: Small retroperitoneal and mesenteric lymph nodes are likely reactive. Small locules of gas throughout the mesentery.  Abdominal wall: Unremarkable.  Osseous structures: Unremarkable.  IMPRESSION: 1. Abscess within the lower central abdomen has increased in size, 5.8 cm in diameter. 2. Interval perforation with small locules of gas seen throughout the mesentery and within the upper abdomen. 3. Thickened terminal ileum and distended appendix, felt to be secondary to the abscess. 4. Significant sigmoid diverticular disease, likely the source of the abscess. 5. The salient findings were discussed with Dr. Lunette Stands on 10/18/2014 at 10:24 am.   Electronically Signed   By: Nolon Nations M.D.   On: 10/18/2014 10:25   Ct Image Guided Drainage By Percutaneous Catheter  10/19/2014   CLINICAL DATA:  44 year old male with enlarging diverticular abscess despite IV antibiotic therapy.  The abscess is centered within the ileal mesentery and nearly completely surrounded by bowel. Given worsening of the abscess collection, an attempt will be made at percutaneous drain placement if a window can be  identified.  EXAM: CT IMAGE GUIDED DRAINAGE BY PERCUTANEOUS CATHETER  Date: 10/19/2014  PROCEDURE: 1. CT-guided placement of 12 French abscess drain Interventional Radiologist:  Criselda Peaches, MD  ANESTHESIA/SEDATION: Moderate (conscious) sedation was used. 3 mg Versed, 150 mcg Fentanyl were administered intravenously. The patient's vital signs were monitored continuously by radiology nursing throughout the procedure.  Sedation Time: 32 minutes  MEDICATIONS: None additional  TECHNIQUE: Informed consent was obtained from the patient following explanation of the procedure, risks, benefits and alternatives. The patient understands, agrees and consents for the procedure. All questions were addressed. A time out was performed.  A planning axial CT scan was performed. The abscess collection was identified. There is a very small when no posterior to the left rectus abdominis muscle. An appropriate skin entry site was selected and marked. The region was then sterilely prepped and draped in standard fashion using Betadine skin prep.  Under intermittent CT fluoroscopic guidance, first a 22 gauge spinal needle was advanced just through the posterior fascia of the left rectus abdominis muscle. Peristalsing loops of adjacent small bowel were intermittently noted within the planned trajectory. Therefore, hydro dissection was carried out using approximately 50 mL sterile saline. This sufficiently displaced the loops of small bowel to allow for safe advancement of a 22 gauge Accustick needle into the fluid and gas collection. Needle advancement was performed under intermittent CT fluoroscopy. A 0.018 inch wire was then coiled within the fluid cavity and the needle exchanged for the Accustick sheath. A 0.035 inch Amplatz wire was then advanced into the fluid cavity. The tract was then dilated to 47 [place] and a Cook 12 [place] all-purpose drainage catheter advanced over the wire and formed within the abscess.  Approximately 40  mL of thick, purulent material was aspirated. The tube was secured to the skin with 0 Prolene suture. The tube was flushed and connected to JP bulb suction. The patient tolerated the procedure well.  COMPLICATIONS: None  IMPRESSION: 1. Technically successful placement of a 12 French percutaneous drainage catheter into the diverticular abscess. Hydro dissection was utilized to facilitate a safe trajectory. 2. Aspiration yielded 40 mL frankly purulent material.  PLAN: 1. Maintain to JP bulb suction until drained decreases to less than 20 cc daily. 2. Flush TID until drainage decreases to less than 20 cc daily 3. Once drainage decreases to less than 20 cc daily, recommend drain injection under fluoroscopy to evaluate for fistulous connection to the adjacent colon prior to tube removal. 4. Patient can be followed in the IR outpatient [facility]. Recommend first visit in 2 weeks following discharge. Signed,  Criselda Peaches, MD  [facility]  [facility]   Electronically Signed   By: Jacqulynn Cadet M.D.   On: 10/19/2014 15:33    Labs:  CBC:  Recent Labs  10/14/14 0245 10/15/14 0733 10/17/14 0445 10/20/14 0533  WBC 8.0 6.3 9.4 7.9  HGB 12.5* 12.9* 13.3 12.8*  HCT 36.7* 39.5 40.6 39.2  PLT 196 216 244 276    COAGS:  Recent Labs  10/14/14 0245  INR 1.20    BMP:  Recent Labs  10/14/14 0245 10/15/14 0733 10/17/14 0445 10/20/14 0533  NA 137 138 138 138  K 3.4* 4.2 4.1 4.0  CL 104 107 104 105  CO2 22 20 25 21   GLUCOSE 105* 78 94 81  BUN 15 15 <5* 12  CALCIUM 8.1* 8.3* 8.3* 8.4  CREATININE 0.97 0.87 1.00 0.89  GFRNONAA >90 >90 >90 >90  GFRAA >90 >90 >90 >90    LIVER FUNCTION TESTS:  Recent Labs  10/12/14 1200 10/13/14 1917 10/14/14 0245 10/17/14 0445  BILITOT 0.8 0.8 0.7 0.5  AST 12 33 29 14  ALT 17 28 31 19   ALKPHOS 56 61 52 57  PROT 8.7* 8.2 7.0 7.1  ALBUMIN 4.4 3.7 3.1* 3.0*    Assessment and Plan:  Abscess  drain intact CCS plan to go home with drain Back to drain clinic in IR next week I will give info to scheduler--he will hear from her   Signed: Endi Lagman A 10/22/2014, 8:50 AM   I spent a total of 15 Minutes in face to face in clinical consultation/evaluation, greater than 50% of which was counseling/coordinating care for abscess drain

## 2014-10-22 NOTE — Telephone Encounter (Signed)
Pt discharged today for perforated diverticulitis with drain in place, has f/u with gen surg and IR. plz call tomorrow to schedule f/u appt next week with me - hosp f/u See how he is doing, and ensure HH has come out to house for drain care.

## 2014-10-22 NOTE — Discharge Instructions (Signed)
Bulb Drain Home Care A bulb drain consists of a thin rubber tube and a soft, round bulb that creates a gentle suction. The rubber tube is placed in the area where you had surgery. A bulb is attached to the end of the tube that is outside the body. The bulb drain removes excess fluid that normally builds up in a surgical wound after surgery. The color and amount of fluid will vary. Immediately after surgery, the fluid is bright red and is a little thicker than water. It may gradually change to a yellow or pink color and become more thin and water-like. When the amount decreases to about 1 or 2 tbsp in 24 hours, your health care provider will usually remove it. DAILY CARE  Keep the bulb flat (compressed) at all times, except while emptying it. The flatness creates suction. You can flatten the bulb by squeezing it firmly in the middle and then closing the cap.  Keep sites where the tube enters the skin dry and covered with a bandage (dressing).  Secure the tube 1-2 in (2.5-5.1 cm) below the insertion sites to keep it from pulling on your stitches. The tube is stitched in place and will not slip out.  Secure the bulb as directed by your health care provider.  For the first 3 days after surgery, there usually is more fluid in the bulb. Empty the bulb whenever it becomes half full because the bulb does not create enough suction if it is too full. The bulb could also overflow. Write down how much fluid you remove each time you empty your drain. Add up the amount removed in 24 hours.  Empty the bulb at the same time every day once the amount of fluid decreases and you only need to empty it once a day. Write down the amounts and the 24-hour totals to give to your health care provider. This helps your health care provider know when the tubes can be removed. EMPTYING THE BULB DRAIN Before emptying the bulb, get a measuring cup, a piece of paper and a pen, and wash your hands.  Gently run your fingers down the  tube (stripping) to empty any drainage from the tubing into the bulb. This may need to be done several times a day to clear the tubing of clots and tissue.  Open the bulb cap to release suction, which causes it to inflate. Do not touch the inside of the cap.  Gently run your fingers down the tube (stripping) to empty any drainage from the tubing into the bulb.  Hold the cap out of the way, and pour fluid into the measuring cup.   Squeeze the bulb to provide suction.  Replace the cap.   Check the tape that holds the tube to your skin. If it is becoming loose, you can remove the loose piece of tape and apply a new one. Then, pin the bulb to your shirt.   Write down the amount of fluid you emptied out. Write down the date and each time you emptied your bulb drain. (If there are 2 bulbs, note the amount of drainage from each bulb and keep the totals separate. Your health care provider will want to know the total amounts for each drain and which tube is draining more.)   Flush the fluid down the toilet and wash your hands.   Call your health care provider once you have less than 2 tbsp of fluid collecting in the bulb drain every 24 hours. If  there is drainage around the tube site, change dressings and keep the area dry. Cleanse around tube with sterile saline and place dry gauze around site. This gauze should be changed when it is soiled. If it stays clean and unsoiled, it should still be changed daily.  SEEK MEDICAL CARE IF:  Your drainage has a bad smell or is cloudy.   You have a fever.   Your drainage is increasing instead of decreasing.   Your tube fell out.   You have redness or swelling around the tube site.   You have drainage from a surgical wound.   Your bulb drain will not stay flat after you empty it.  MAKE SURE YOU:   Understand these instructions.  Will watch your condition.  Will get help right away if you are not doing well or get worse. Document  Released: 08/18/2000 Document Revised: 01/05/2014 Document Reviewed: 01/24/2012 [facility] Patient Information 2015 [place], [place]. This information is not intended to replace advice given to you by your health care provider. Make sure you discuss any questions you have with your health care provider.  Low-Fiber Diet for 3 weeks, then switch to a high fiber diet Fiber is found in fruits, vegetables, and whole grains. A low-fiber diet restricts fibrous foods that are not digested in the small intestine. A diet containing about 10-15 grams of fiber per day is considered low fiber. Low-fiber diets may be used to:  Promote healing and rest the bowel during intestinal flare-ups.  Prevent blockage of a partially obstructed or narrowed gastrointestinal tract.  Reduce fecal weight and volume.  Slow the movement of feces. You may be on a low-fiber diet as a transitional diet following surgery, after an injury (trauma), or because of a short (acute) or lifelong (chronic) illness. Your health care provider will determine the length of time you need to stay on this diet.  WHAT DO I NEED TO KNOW ABOUT A LOW-FIBER DIET? Always check the fiber content on the packaging's Nutrition Facts label, especially on foods from the grains list. Ask your dietitian if you have questions about specific foods that are related to your condition, especially if the food is not listed below. In general, a low-fiber food will have less than 2 g of fiber. WHAT FOODS CAN I EAT? Grains All breads and crackers made with white flour. Sweet rolls, doughnuts, waffles, pancakes, [place] toast, bagels. Pretzels, Melba toast, zwieback. Well-cooked cereals, such as cornmeal, farina, or cream cereals. Dry cereals that do not contain whole grains, fruit, or nuts, such as refined corn, wheat, rice, and oat cereals. Potatoes prepared any way without skins, plain pastas and noodles, refined white rice. Use white flour for baking and making sauces.  Use allowed list of grains for casseroles, dumplings, and puddings.  Vegetables Strained tomato and vegetable juices. Fresh lettuce, cucumber, spinach. Well-cooked (no skin or pulp) or canned vegetables, such as asparagus, bean sprouts, beets, carrots, green beans, mushrooms, potatoes, pumpkin, spinach, yellow squash, tomato sauce/puree, turnips, yams, and zucchini. Keep servings limited to  cup.  Fruits All fruit juices except prune juice. Cooked or canned fruits without skin and seeds, such as applesauce, apricots, cherries, fruit cocktail, grapefruit, grapes, mandarin oranges, melons, peaches, pears, pineapple, and plums. Fresh fruits without skin, such as apricots, avocados, bananas, melons, pineapple, nectarines, and peaches. Keep servings limited to  cup or 1 piece.  Meat and Other Protein Sources Ground or well-cooked tender beef, ham, veal, lamb, pork, or poultry. Eggs, plain cheese. Fish, oysters,  shrimp, lobster, and other seafood. Liver, organ meats. Smooth nut butters. Dairy All milk products and alternative dairy substitutes, such as soy, rice, almond, and coconut, not containing added whole nuts, seeds, or added fruit. Beverages Decaf coffee, fruit, and vegetable juices or smoothies (small amounts, with no pulp or skins, and with fruits from allowed list), sports drinks, herbal tea. Condiments Ketchup, mustard, vinegar, cream sauce, cheese sauce, cocoa powder. Spices in moderation, such as allspice, basil, bay leaves, celery powder or leaves, cinnamon, cumin powder, curry powder, ginger, mace, marjoram, onion or garlic powder, oregano, paprika, parsley flakes, ground pepper, rosemary, sage, savory, tarragon, thyme, and turmeric. Sweets and Desserts Plain cakes and cookies, pie made with allowed fruit, pudding, custard, cream pie. Gelatin, fruit, ice, sherbet, frozen ice pops. Ice cream, ice milk without nuts. Plain hard candy, honey, jelly, molasses, syrup, sugar, chocolate syrup,  gumdrops, marshmallows. Limit overall sugar intake.  Fats and Oil Margarine, butter, cream, mayonnaise, salad oils, plain salad dressings made from allowed foods. Choose healthy fats such as olive oil, canola oil, and omega-3 fatty acids (such as found in salmon or tuna) when possible.  Other Bouillon, broth, or cream soups made from allowed foods. Any strained soup. Casseroles or mixed dishes made with allowed foods. The items listed above may not be a complete list of recommended foods or beverages. Contact your dietitian for more options.  WHAT FOODS ARE NOT RECOMMENDED? Grains All whole wheat and whole grain breads and crackers. Multigrains, rye, bran seeds, nuts, or coconut. Cereals containing whole grains, multigrains, bran, coconut, nuts, raisins. Cooked or dry oatmeal, steel-cut oats. Coarse wheat cereals, granola. Cereals advertised as high fiber. Potato skins. Whole grain pasta, wild or brown rice. Popcorn. Coconut flour. Bran, buckwheat, corn bread, multigrains, rye, wheat germ.  Vegetables Fresh, cooked or canned vegetables, such as artichokes, asparagus, beet greens, broccoli, Brussels sprouts, cabbage, celery, cauliflower, corn, eggplant, kale, legumes or beans, okra, peas, and tomatoes. Avoid large servings of any vegetables, especially raw vegetables.  Fruits Fresh fruits, such as apples with or without skin, berries, cherries, figs, grapes, grapefruit, guavas, kiwis, mangoes, oranges, papayas, pears, persimmons, pineapple, and pomegranate. Prune juice and juices with pulp, stewed or dried prunes. Dried fruits, dates, raisins. Fruit seeds or skins. Avoid large servings of all fresh fruits. Meats and Other Protein Sources Tough, fibrous meats with gristle. Chunky nut butter. Cheese made with seeds, nuts, or other foods not recommended. Nuts, seeds, legumes (beans, including baked beans), dried peas, beans, lentils.  Dairy Yogurt or cheese that contains nuts, seeds, or added fruit.    Beverages Fruit juices with high pulp, prune juice. Caffeinated coffee and teas.  Condiments Coconut, maple syrup, pickles, olives. Sweets and Desserts Desserts, cookies, or candies that contain nuts or coconut, chunky peanut butter, dried fruits. Jams, preserves with seeds, marmalade. Large amounts of sugar and sweets. Any other dessert made with fruits from the not recommended list.  Other Soups made from vegetables that are not recommended or that contain other foods not recommended.  The items listed above may not be a complete list of foods and beverages to avoid. Contact your dietitian for more information. Document Released: 02/10/2002 Document Revised: 08/26/2013 Document Reviewed: 07/14/2013 [facility] Patient Information 2015 [place], [place]. This information is not intended to replace advice given to you by your health care provider. Make sure you discuss any questions you have with your health care provider.  High-Fiber Diet Fiber is found in fruits, vegetables, and grains. A high-fiber diet encourages the  addition of more whole grains, legumes, fruits, and vegetables in your diet. The recommended amount of fiber for adult males is 38 g per day. For adult females, it is 25 g per day. Pregnant and lactating women should get 28 g of fiber per day. If you have a digestive or bowel problem, ask your caregiver for advice before adding high-fiber foods to your diet. Eat a variety of high-fiber foods instead of only a select few type of foods.  PURPOSE  To increase stool bulk.  To make bowel movements more regular to prevent constipation.  To lower cholesterol.  To prevent overeating. WHEN IS THIS DIET USED?  It may be used if you have constipation and hemorrhoids.  It may be used if you have uncomplicated diverticulosis (intestine condition) and irritable bowel syndrome.  It may be used if you need help with weight management.  It may be used if you want to add it to your diet as  a protective measure against atherosclerosis, diabetes, and cancer. SOURCES OF FIBER  Whole-grain breads and cereals.  Fruits, such as apples, oranges, bananas, berries, prunes, and pears.  Vegetables, such as green peas, carrots, sweet potatoes, beets, broccoli, cabbage, spinach, and artichokes.  Legumes, such split peas, soy, lentils.  Almonds. FIBER CONTENT IN FOODS Starches and Grains / Dietary Fiber (g)  Cheerios, 1 cup / 3 g  Corn Flakes cereal, 1 cup / 0.7 g  Rice crispy treat cereal, 1 cup / 0.3 g  Instant oatmeal (cooked),  cup / 2 g  Frosted wheat cereal, 1 cup / 5.1 g  Brown, long-grain rice (cooked), 1 cup / 3.5 g  White, long-grain rice (cooked), 1 cup / 0.6 g  Enriched macaroni (cooked), 1 cup / 2.5 g Legumes / Dietary Fiber (g)  Baked beans (canned, plain, or vegetarian),  cup / 5.2 g  Kidney beans (canned),  cup / 6.8 g  Pinto beans (cooked),  cup / 5.5 g Breads and Crackers / Dietary Fiber (g)  Plain or honey graham crackers, 2 squares / 0.7 g  Saltine crackers, 3 squares / 0.3 g  Plain, salted pretzels, 10 pieces / 1.8 g  Whole-wheat bread, 1 slice / 1.9 g  White bread, 1 slice / 0.7 g  Raisin bread, 1 slice / 1.2 g  Plain bagel, 3 oz / 2 g  Flour tortilla, 1 oz / 0.9 g  Corn tortilla, 1 small / 1.5 g  Hamburger or hotdog bun, 1 small / 0.9 g Fruits / Dietary Fiber (g)  Apple with skin, 1 medium / 4.4 g  Sweetened applesauce,  cup / 1.5 g  Banana,  medium / 1.5 g  Grapes, 10 grapes / 0.4 g  Orange, 1 small / 2.3 g  Raisin, 1.5 oz / 1.6 g  Melon, 1 cup / 1.4 g Vegetables / Dietary Fiber (g)  Green beans (canned),  cup / 1.3 g  Carrots (cooked),  cup / 2.3 g  Broccoli (cooked),  cup / 2.8 g  Peas (cooked),  cup / 4.4 g  Mashed potatoes,  cup / 1.6 g  Lettuce, 1 cup / 0.5 g  Corn (canned),  cup / 1.6 g  Tomato,  cup / 1.1 g Document Released: 08/21/2005 Document Revised: 02/20/2012 Document  Reviewed: 11/23/2011 [employer] Patient Information 2015 [place], [place]. This information is not intended to replace advice given to you by your health care provider. Make sure you discuss any questions you have with your health care provider.

## 2014-10-22 NOTE — Progress Notes (Signed)
Patient Discharge: Disposition: Patient discharged home. Education: Educated about medications, prescriptions, follow-up appointments, about drain, and discharge instructions. IV: Discontinued before discharge. Transportation: Patient transported in w/c with staff and father accompanying. Belongings: Patient took all his belongings with him.

## 2014-10-22 NOTE — Progress Notes (Signed)
Patient ambulated in the hall to and fro without any difficulty or any complaints of nausea.

## 2014-10-23 DIAGNOSIS — K651 Peritoneal abscess: Secondary | ICD-10-CM | POA: Diagnosis not present

## 2014-10-23 DIAGNOSIS — R569 Unspecified convulsions: Secondary | ICD-10-CM | POA: Diagnosis not present

## 2014-10-23 DIAGNOSIS — K5790 Diverticulosis of intestine, part unspecified, without perforation or abscess without bleeding: Secondary | ICD-10-CM | POA: Diagnosis not present

## 2014-10-23 DIAGNOSIS — G809 Cerebral palsy, unspecified: Secondary | ICD-10-CM | POA: Diagnosis not present

## 2014-10-23 DIAGNOSIS — E785 Hyperlipidemia, unspecified: Secondary | ICD-10-CM | POA: Diagnosis not present

## 2014-10-23 LAB — CULTURE, ROUTINE-ABSCESS: SPECIAL REQUESTS: NORMAL

## 2014-10-23 NOTE — Telephone Encounter (Signed)
Message left for patient's father to return my call with an update. Follow up has already been scheduled.

## 2014-10-26 DIAGNOSIS — R569 Unspecified convulsions: Secondary | ICD-10-CM | POA: Diagnosis not present

## 2014-10-26 DIAGNOSIS — E785 Hyperlipidemia, unspecified: Secondary | ICD-10-CM | POA: Diagnosis not present

## 2014-10-26 DIAGNOSIS — K651 Peritoneal abscess: Secondary | ICD-10-CM | POA: Diagnosis not present

## 2014-10-26 DIAGNOSIS — K5790 Diverticulosis of intestine, part unspecified, without perforation or abscess without bleeding: Secondary | ICD-10-CM | POA: Diagnosis not present

## 2014-10-26 DIAGNOSIS — G809 Cerebral palsy, unspecified: Secondary | ICD-10-CM | POA: Diagnosis not present

## 2014-10-28 ENCOUNTER — Ambulatory Visit (INDEPENDENT_AMBULATORY_CARE_PROVIDER_SITE_OTHER): Payer: Medicare Other | Admitting: Family Medicine

## 2014-10-28 ENCOUNTER — Encounter: Payer: Self-pay | Admitting: Family Medicine

## 2014-10-28 VITALS — BP 110/78 | HR 76 | Temp 98.2°F | Wt 217.5 lb

## 2014-10-28 DIAGNOSIS — K651 Peritoneal abscess: Secondary | ICD-10-CM

## 2014-10-28 DIAGNOSIS — K5780 Diverticulitis of intestine, part unspecified, with perforation and abscess without bleeding: Secondary | ICD-10-CM | POA: Diagnosis not present

## 2014-10-28 NOTE — Assessment & Plan Note (Signed)
Overall recovering well. Minimal drainage from drain (5cc last few days).  Pt/dad state they have not been contacted by IR for f/u at drain clinic. I spoke with Dr Malena Peer on call for IR and my CMA has left message for [facility] scheduler to contact us or patient to set him up at drain clinic prior to appt with gen surgery next Thursday.  Wound culture grew mod candida albicans. As asxs currently, did not treat today.  Will await gen surg and drain clinic eval.

## 2014-10-28 NOTE — Progress Notes (Signed)
BP 110/78 mmHg  Pulse 76  Temp(Src) 98.2 F (36.8 C) (Oral)  Wt 217 lb 8 oz (98.657 kg)   CC: hosp f/u visit  Subjective:    Patient ID: Edward Adkins, male    DOB: 15-Apr-1971, 44 y.o.   MRN: [id number]  HPI: GORDY GOAR is a 45 y.o. male presenting on 10/28/2014 for Follow-up   Embry presents today with dad for hospital f/u visit after hospitalization with perforated diverticulitis with large intra abdominal abscess. 12 French perc drainage catheter placed with aspiration of 23mL pus. JP drain was placed 10/19/2014. Discharged on augmentin course and vicodin 5/325mg  for pain. Plan is f/u CT once output diminishing. He is currently getting about 5 cc of daily output from his JP drain. He has f/u scheduled with IR drain clinic and gen surgery outpatient. Abscess culture grew multiple organisms with none predominating. Treated in hospital with IV zosyn x8d which was transitioned to augmentin. Dad does dressing changes at home - has been using a wound cleaner as well. Has [place] wound nurse. Has not been contacted by IR for f/u in drain clinic. F/u with gen surgery planned 11/05/2014.  He did have wound culture growing moderate candida albicans.   Asks about return to work. Does physical labor at work.  Denies fevers/chills, nausea/vomiting. Occasional abd discomfort from drain pulling. Rare use of vicodin. Endorses regular soft stools (2-3 stools/day). Currently following low fiber diet. Has questions about diet.   Admit date: 10/13/2014 Discharge date: 10/22/2014 F/u phone call: not completed - pt never returned our call.  Discharge Diagnoses:   Diverticulitis of intestine with perforation Active Problems:  Complex partial seizures  Hyperlipidemia  Cerebral palsy  Diverticulitis large intestine  Abscess  Intra-abdominal abscess  F/U CT once output decreases, as outpatient Follow-up with interventional radiology,Pam Nonie Hoyer, who will arrange IR clinic follow up Follow up in 2  weeks with general surgery, Dr. Ninfa Linden Follow-up with PCP in 5-7 days Home health being arranged for for drain care  Relevant past medical, surgical, family and social history reviewed and updated as indicated. Interim medical history since our last visit reviewed. Allergies and medications reviewed and updated. Current Outpatient Prescriptions on File Prior to Visit  Medication Sig  . amoxicillin-clavulanate (AUGMENTIN) 875-125 MG per tablet Take 1 tablet by mouth 2 (two) times daily.  . carbamazepine (TEGRETOL) 200 MG tablet Take 1 tablet (200 mg total) by mouth 3 (three) times daily.  Marland Kitchen HYDROcodone-acetaminophen (NORCO/VICODIN) 5-325 MG per tablet Take 1 tablet by mouth every 6 (six) hours as needed for moderate pain.  . Multiple Vitamin (MULTIVITAMIN) tablet Take 2 tablets by mouth daily.    No current facility-administered medications on file prior to visit.    Review of Systems Per HPI unless specifically indicated above     Objective:    BP 110/78 mmHg  Pulse 76  Temp(Src) 98.2 F (36.8 C) (Oral)  Wt 217 lb 8 oz (98.657 kg)  Wt Readings from Last 3 Encounters:  10/28/14 217 lb 8 oz (98.657 kg)  10/21/14 213 lb 3 oz (96.7 kg)  10/12/14 222 lb (100.699 kg)    Physical Exam  Constitutional: He appears well-developed and well-nourished. No distress.  HENT:  Mouth/Throat: Oropharynx is clear and moist. No oropharyngeal exudate.  Eyes: Conjunctivae are normal. Pupils are equal, round, and reactive to light.  Cardiovascular: Normal rate, regular rhythm, normal heart sounds and intact distal pulses.   No murmur heard. Pulmonary/Chest: Effort normal and breath sounds normal.  No respiratory distress. He has no wheezes. He has no rales.  Abdominal: Soft. Normal appearance and bowel sounds are normal. He exhibits no distension and no mass. There is tenderness (mild) in the left lower quadrant. There is no rebound, no guarding and no CVA tenderness.    JP drain in place,  dressings c/d/i, drain wound without erythema  Musculoskeletal: He exhibits no edema.  Skin: Skin is warm and dry. No rash noted.  Nursing note and vitals reviewed.   Lab Results  Component Value Date   WBC 7.9 10/20/2014   HGB 12.8* 10/20/2014   HCT 39.2 10/20/2014   MCV 92.9 10/20/2014   PLT 276 10/20/2014      Assessment & Plan:   Problem List Items Addressed This Visit    Intra-abdominal abscess - Primary    Overall recovering well. Minimal drainage from drain (5cc last few days).  Pt/dad state they have not been contacted by IR for f/u at drain clinic. I spoke with Dr Malena Peer on call for IR and my CMA has left message for [facility] scheduler to contact us or patient to set him up at drain clinic prior to appt with gen surgery next Thursday.  Wound culture grew mod candida albicans. As asxs currently, did not treat today.  Will await gen surg and drain clinic eval.      Diverticulitis of intestine with perforation    Discussed need for colonoscopy once fully recovered from current infection.          Follow up plan: Return in about 4 weeks (around 11/25/2014), or as needed, for follow up visit.

## 2014-10-28 NOTE — Patient Instructions (Addendum)
I think wound is looking good. Continue treatment as up to now. Out of work until seen next week. Return in 4-6 weeks for f/u. We have left message with IR to contact you or [place] to set up appointment hopefully this week at drain clinic.

## 2014-10-28 NOTE — Progress Notes (Signed)
Pre visit review using our clinic review tool, if applicable. No additional management support is needed unless otherwise documented below in the visit note. 

## 2014-10-28 NOTE — Assessment & Plan Note (Signed)
Discussed need for colonoscopy once fully recovered from current infection.

## 2014-10-29 ENCOUNTER — Other Ambulatory Visit (HOSPITAL_COMMUNITY): Payer: Self-pay | Admitting: Interventional Radiology

## 2014-10-29 DIAGNOSIS — G809 Cerebral palsy, unspecified: Secondary | ICD-10-CM | POA: Diagnosis not present

## 2014-10-29 DIAGNOSIS — E785 Hyperlipidemia, unspecified: Secondary | ICD-10-CM | POA: Diagnosis not present

## 2014-10-29 DIAGNOSIS — L0291 Cutaneous abscess, unspecified: Secondary | ICD-10-CM

## 2014-10-29 DIAGNOSIS — K651 Peritoneal abscess: Secondary | ICD-10-CM | POA: Diagnosis not present

## 2014-10-29 DIAGNOSIS — R569 Unspecified convulsions: Secondary | ICD-10-CM | POA: Diagnosis not present

## 2014-10-29 DIAGNOSIS — K5790 Diverticulosis of intestine, part unspecified, without perforation or abscess without bleeding: Secondary | ICD-10-CM | POA: Diagnosis not present

## 2014-11-03 ENCOUNTER — Encounter (HOSPITAL_COMMUNITY): Payer: Self-pay

## 2014-11-03 ENCOUNTER — Ambulatory Visit (HOSPITAL_COMMUNITY)
Admission: RE | Admit: 2014-11-03 | Discharge: 2014-11-03 | Disposition: A | Discharge status: Home / Self Care | Payer: Medicare Other | Source: Ambulatory Visit | Attending: Interventional Radiology | Admitting: Interventional Radiology

## 2014-11-03 DIAGNOSIS — L0291 Cutaneous abscess, unspecified: Secondary | ICD-10-CM

## 2014-11-03 DIAGNOSIS — K630 Abscess of intestine: Secondary | ICD-10-CM | POA: Diagnosis not present

## 2014-11-03 MED ORDER — IOHEXOL 300 MG/ML  SOLN
100.0000 mL | Freq: Once | INTRAMUSCULAR | Status: AC | PRN
  Administered 2014-11-03: 100 mL via INTRAVENOUS

## 2014-11-03 MED ORDER — IOHEXOL 300 MG/ML  SOLN
50.0000 mL | Freq: Once | INTRAMUSCULAR | Status: AC | PRN
  Administered 2014-11-03: 10 mL

## 2014-11-03 NOTE — Progress Notes (Signed)
Patient ID: Edward Adkins, male   DOB: 10/23/1970, 44 y.o.   MRN: [id number]   Chief Complaint: No chief complaint on file.  abscess drain follow-up  Referring Physician(s): McCullough,Heath  History of Present Illness: Edward Adkins is a 44 y.o. male who had a diverticular abscess drain placed 2 weeks ago. He denies fevers or chills during the last 3 days. He did have chills 4 days ago. He did finish his course of antibiotics. He denies any abdominal pain or changes in bowel habits. Outputs have been minimal and serous.  Past Medical History  Diagnosis Date  . Complex partial seizure disorder 05/78    Head CT's multiple-WNL, EEG R temporal lobe discharge ([facility])  . Cerebral palsy   . Mild mental retardation   . Diverticulitis 10/14/2014  . Perforated bowel 10/14/2014    No past surgical history on file.  Allergies: Phenobarbital  Medications: Prior to Admission medications   Medication Sig Start Date End Date Taking? Authorizing Provider  carbamazepine (TEGRETOL) 200 MG tablet Take 1 tablet (200 mg total) by mouth 3 (three) times daily. 08/06/14   Dennie Bible, NP  HYDROcodone-acetaminophen (NORCO/VICODIN) 5-325 MG per tablet Take 1 tablet by mouth every 6 (six) hours as needed for moderate pain. 10/22/14   Reyne Dumas, MD  Multiple Vitamin (MULTIVITAMIN) tablet Take 2 tablets by mouth daily.     Historical Provider, MD    Family History  Problem Relation Age of Onset  . Cancer Mother 18    Leukemia  . Cancer Father 27    Prostate ca  . CAD Father   . Diabetes Father     History   Social History  . Marital Status: Single    Spouse Name: N/A  . Number of Children: 0  . Years of Education: 12   Occupational History  .     Social History Main Topics  . Smoking status: Never Smoker   . Smokeless tobacco: Never Used  . Alcohol Use: No  . Drug Use: No  . Sexual Activity: Not on file   Other Topics Concern  . None   Social History Narrative   Patient  is single and lives with his father.   Patient drinks two caffeine drinks daily.   Patient is ambi-dextrous.   Patient has a high school education.   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              Review of Systems: A 12 point ROS discussed and pertinent positives are indicated in the HPI above.  All other systems are negative.  Review of Systems  Vital Signs: There were no vitals taken for this visit.  Physical Exam  Constitutional: He appears well-developed and well-nourished.  Abdominal:  The left lower quadrant abdominal abscess drain site is clean and dry without evidence of cellulitis. There is minimal serous fluid in the JP bulb. No abdominal tenderness.    Imaging: Ct Abdomen Pelvis W Contrast  11/03/2014   ADDENDUM REPORT: 11/03/2014 10:12  ADDENDUM: The impression should read as the following:  IMPRESSION: Resolved sigmoid abscess.  Soft tissue track leading to the adjacent sigmoid is present. Fistula is not excluded. Drain injection to follow.  Inflammatory changes of the sigmoid colon have nearly resolved. Mild secondary inflammatory changes at the tip of the appendix.  No recurrent or new abscess   Electronically Signed   By: Marybelle Killings M.D.   On: 11/03/2014 10:12   11/03/2014   CLINICAL  DATA:  Followup abscess strain  EXAM: CT ABDOMEN AND PELVIS WITH CONTRAST  TECHNIQUE: Multidetector CT imaging of the abdomen and pelvis was performed using the standard protocol following bolus administration of intravenous contrast.  CONTRAST:  118mL OMNIPAQUE IOHEXOL 300 MG/ML  SOLN  COMPARISON:  10/18/2014  FINDINGS: A left lower quadrant abscess strain is in place and stable in position. Abscess cavity has resolved.  A soft tissue tract leading to the sigmoid colon is present. Developing fistula is not excluded.  Secondary inflammatory changes at the tip of the appendix are mild. Inflammatory changes of the sigmoid colon have nearly resolved.  No recurrent abscess.  No free  intraperitoneal gas.  Mild diffuse hepatic steatosis  Gallbladder, spleen, pancreas, adrenal glands, and kidneys are unremarkable.  No free-fluid.  No abnormal retroperitoneal adenopathy.  IMPRESSION: New resolved sigmoid abscess.  Soft tissue track leading to the adjacent sigmoid is present. Fistula is not excluded. Drain injection to follow.  Inflammatory changes of the sigmoid colon have nearly resolved. Mild secondary inflammatory changes at the tip of the appendix.  No recurrent or new abscess.  Electronically Signed: By: Marybelle Killings M.D. On: 11/03/2014 09:42   Ct Abdomen Pelvis W Contrast  10/18/2014   CLINICAL DATA:  Abdominal pain. Umbilical area with fever. Pain on left side of the abdomen. Pain started 10/10/2014  EXAM: CT ABDOMEN AND PELVIS WITH CONTRAST  TECHNIQUE: Multidetector CT imaging of the abdomen and pelvis was performed using the standard protocol following bolus administration of intravenous contrast.  CONTRAST:  130mL OMNIPAQUE IOHEXOL 300 MG/ML  SOLN  COMPARISON:  10/13/2014  FINDINGS: Lower chest: The lung bases are clear.  Heart size is normal.  Upper abdomen: There is a small amount of free intraperitoneal air. This is a new finding since the prior study. Stable small low-attenuation lesions are identified within the liver, likely representing cyst. No focal abnormality identified within the spleen, pancreas, or adrenal glands. The kidneys have a normal appearance. Gallbladder is present and is collapsed.  Gastrointestinal tract: The stomach has a normal appearance. Within the lower central abdomen there is an abscess measuring 5.8 x 4.3 cm. The previously this measured 3.6 cm. There is significant surrounding mesenteric inflammation. The terminal ileum is immediately anterior to this abscess and demonstrates a thickened wall. The base of the appendix is immediately adjacent to the abscess. The appendix is distended. However there is little inflammation around the distal tip of the  appendix and the appearance is favored to represent a secondary process. The sigmoid colon continues is show significant inflammation as well as numerous diverticula. On the previous exam contrast extended towards the abscess from the sigmoid colon, suggesting a diverticular abscess.  Pelvis: Urinary bladder has a normal appearance. There is no free pelvic fluid. Significant pelvic mesentery inflammatory change.  Retroperitoneum: Small retroperitoneal and mesenteric lymph nodes are likely reactive. Small locules of gas throughout the mesentery.  Abdominal wall: Unremarkable.  Osseous structures: Unremarkable.  IMPRESSION: 1. Abscess within the lower central abdomen has increased in size, 5.8 cm in diameter. 2. Interval perforation with small locules of gas seen throughout the mesentery and within the upper abdomen. 3. Thickened terminal ileum and distended appendix, felt to be secondary to the abscess. 4. Significant sigmoid diverticular disease, likely the source of the abscess. 5. The salient findings were discussed with Dr. Lunette Stands on 10/18/2014 at 10:24 am.   Electronically Signed   By: Nolon Nations M.D.   On: 10/18/2014 10:25   Ct Abdomen  Pelvis W Contrast  10/13/2014   CLINICAL DATA:  Low abdominal pain and fever.  EXAM: CT ABDOMEN AND PELVIS WITH CONTRAST  TECHNIQUE: Multidetector CT imaging of the abdomen and pelvis was performed using the standard protocol following bolus administration of intravenous contrast.  CONTRAST:  124mL OMNIPAQUE IOHEXOL 300 MG/ML  SOLN  COMPARISON:  None.  FINDINGS: BODY WALL: Unremarkable.  LOWER CHEST: Unremarkable.  ABDOMEN/PELVIS:  Liver: Sub cm low-density liver likely reflects cysts.  Biliary: No evidence of biliary obstruction or stone.  Pancreas: Unremarkable.  Spleen: Unremarkable.  Adrenals: Unremarkable.  Kidneys and ureters: No hydronephrosis or stone.  Bladder: Unremarkable.  Reproductive: Unremarkable.  Bowel: Bowel perforation in the low mid abdomen with 4 cm  gas and fluid containing abscess that also contains oral contrast. A tract of oral contrast extends towards the sigmoid colon, favoring a diverticular perforation. The appendix base is contiguous with the abscess and is overdistended and avidly enhancing. While the appendix is likely obstructed, the inflammatory changes around the tip and mid portion are not as extensive as expected for primary appendicitis. There is terminal ileitis with circumferential submucosal edema, likely secondary. Bowel encompasses most of the abscess circumference, making percutaneous drainage difficult.  Retroperitoneum: No mass or adenopathy.  Peritoneum: No ascites or pneumoperitoneum.  Vascular: No acute abnormality.  OSSEOUS: No acute abnormalities.  These results were called by telephone at the time of interpretation on 10/13/2014 at 4:00 pm to Dr. [name] , who verbally acknowledged these results.  IMPRESSION: Contained bowel perforation in the low abdomen with 4 cm abscess. A diverticular source is favored over ruptured appendix, as reasoned above.   Electronically Signed   By: Monte Fantasia M.D.   On: 10/13/2014 16:04   Ir Sinus/fist Tube Chk-non Gi  11/03/2014   CLINICAL DATA:  Diverticular abscess  EXAM: SINUS TRACT INJECTION/FISTULOGRAM  PROCEDURE: Contrast was injected into the diverticuli abscess and imaging was obtained. The drain was cut and removed in its entirety without complication.  FINDINGS: The abscess cavities completely decompressed. There is no evidence of fistula.  IMPRESSION: Abscess has resolved and there is no evidence of fistula. The drain was removed.   Electronically Signed   By: Marybelle Killings M.D.   On: 11/03/2014 10:11   Ct Image Guided Drainage By Percutaneous Catheter  10/19/2014   CLINICAL DATA:  44 year old male with enlarging diverticular abscess despite IV antibiotic therapy. The abscess is centered within the ileal mesentery and nearly completely surrounded by bowel. Given worsening of  the abscess collection, an attempt will be made at percutaneous drain placement if a window can be identified.  EXAM: CT IMAGE GUIDED DRAINAGE BY PERCUTANEOUS CATHETER  Date: 10/19/2014  PROCEDURE: 1. CT-guided placement of 12 French abscess drain Interventional Radiologist:  Criselda Peaches, MD  ANESTHESIA/SEDATION: Moderate (conscious) sedation was used. 3 mg Versed, 150 mcg Fentanyl were administered intravenously. The patient's vital signs were monitored continuously by radiology nursing throughout the procedure.  Sedation Time: 32 minutes  MEDICATIONS: None additional  TECHNIQUE: Informed consent was obtained from the patient following explanation of the procedure, risks, benefits and alternatives. The patient understands, agrees and consents for the procedure. All questions were addressed. A time out was performed.  A planning axial CT scan was performed. The abscess collection was identified. There is a very small when no posterior to the left rectus abdominis muscle. An appropriate skin entry site was selected and marked. The region was then sterilely prepped and draped in standard fashion using Betadine  skin prep.  Under intermittent CT fluoroscopic guidance, first a 22 gauge spinal needle was advanced just through the posterior fascia of the left rectus abdominis muscle. Peristalsing loops of adjacent small bowel were intermittently noted within the planned trajectory. Therefore, hydro dissection was carried out using approximately 50 mL sterile saline. This sufficiently displaced the loops of small bowel to allow for safe advancement of a 22 gauge Accustick needle into the fluid and gas collection. Needle advancement was performed under intermittent CT fluoroscopy. A 0.018 inch wire was then coiled within the fluid cavity and the needle exchanged for the Accustick sheath. A 0.035 inch Amplatz wire was then advanced into the fluid cavity. The tract was then dilated to 19 [place] and a Cook 12 [place]  all-purpose drainage catheter advanced over the wire and formed within the abscess.  Approximately 40 mL of thick, purulent material was aspirated. The tube was secured to the skin with 0 Prolene suture. The tube was flushed and connected to JP bulb suction. The patient tolerated the procedure well.  COMPLICATIONS: None  IMPRESSION: 1. Technically successful placement of a 12 French percutaneous drainage catheter into the diverticular abscess. Hydro dissection was utilized to facilitate a safe trajectory. 2. Aspiration yielded 40 mL frankly purulent material.  PLAN: 1. Maintain to JP bulb suction until drained decreases to less than 20 cc daily. 2. Flush TID until drainage decreases to less than 20 cc daily 3. Once drainage decreases to less than 20 cc daily, recommend drain injection under fluoroscopy to evaluate for fistulous connection to the adjacent colon prior to tube removal. 4. Patient can be followed in the IR outpatient [facility]. Recommend first visit in 2 weeks following discharge. Signed,  Criselda Peaches, MD  [facility]  [facility]   Electronically Signed   By: Jacqulynn Cadet M.D.   On: 10/19/2014 15:33    Labs:  CBC:  Recent Labs  10/14/14 0245 10/15/14 0733 10/17/14 0445 10/20/14 0533  WBC 8.0 6.3 9.4 7.9  HGB 12.5* 12.9* 13.3 12.8*  HCT 36.7* 39.5 40.6 39.2  PLT 196 216 244 276    COAGS:  Recent Labs  10/14/14 0245  INR 1.20    BMP:  Recent Labs  10/14/14 0245 10/15/14 0733 10/17/14 0445 10/20/14 0533  NA 137 138 138 138  K 3.4* 4.2 4.1 4.0  CL 104 107 104 105  CO2 22 20 25 21   GLUCOSE 105* 78 94 81  BUN 15 15 <5* 12  CALCIUM 8.1* 8.3* 8.3* 8.4  CREATININE 0.97 0.87 1.00 0.89  GFRNONAA >90 >90 >90 >90  GFRAA >90 >90 >90 >90    LIVER FUNCTION TESTS:  Recent Labs  10/12/14 1200 10/13/14 1917 10/14/14 0245 10/17/14 0445  BILITOT 0.8 0.8 0.7 0.5  AST 12 33 29 14  ALT 17 28 31 19   ALKPHOS  56 61 52 57  PROT 8.7* 8.2 7.0 7.1  ALBUMIN 4.4 3.7 3.1* 3.0*    TUMOR MARKERS: No results for input(s): AFPTM, CEA, CA199, CHROMGRNA in the last 8760 hours.  Assessment and Plan:  The abscess adjacent to the sigmoid colon has resolved and there is no evidence of fistula. The drain was removed without complication.   Signed: Kaisley Stiverson, ART A 11/03/2014, 11:41 AM   I spent a total of 15 minutes face to face in clinical consultation, greater than 50% of which was counseling/coordinating care for diverticular abscess

## 2014-11-04 DIAGNOSIS — R569 Unspecified convulsions: Secondary | ICD-10-CM | POA: Diagnosis not present

## 2014-11-04 DIAGNOSIS — K651 Peritoneal abscess: Secondary | ICD-10-CM | POA: Diagnosis not present

## 2014-11-04 DIAGNOSIS — K5790 Diverticulosis of intestine, part unspecified, without perforation or abscess without bleeding: Secondary | ICD-10-CM | POA: Diagnosis not present

## 2014-11-04 DIAGNOSIS — E785 Hyperlipidemia, unspecified: Secondary | ICD-10-CM | POA: Diagnosis not present

## 2014-11-04 DIAGNOSIS — G809 Cerebral palsy, unspecified: Secondary | ICD-10-CM | POA: Diagnosis not present

## 2014-11-06 DIAGNOSIS — K630 Abscess of intestine: Secondary | ICD-10-CM | POA: Diagnosis not present

## 2014-11-10 DIAGNOSIS — E785 Hyperlipidemia, unspecified: Secondary | ICD-10-CM

## 2014-11-10 DIAGNOSIS — K651 Peritoneal abscess: Secondary | ICD-10-CM | POA: Diagnosis not present

## 2014-11-10 DIAGNOSIS — G809 Cerebral palsy, unspecified: Secondary | ICD-10-CM | POA: Diagnosis not present

## 2014-11-10 DIAGNOSIS — R569 Unspecified convulsions: Secondary | ICD-10-CM | POA: Diagnosis not present

## 2014-11-10 DIAGNOSIS — K5790 Diverticulosis of intestine, part unspecified, without perforation or abscess without bleeding: Secondary | ICD-10-CM | POA: Diagnosis not present

## 2014-11-11 ENCOUNTER — Telehealth: Payer: Self-pay | Admitting: Family Medicine

## 2014-11-11 DIAGNOSIS — Z7689 Persons encountering health services in other specified circumstances: Secondary | ICD-10-CM

## 2014-11-11 NOTE — Telephone Encounter (Signed)
In your IN box for completion.  

## 2014-11-11 NOTE — Telephone Encounter (Signed)
Filled and in Kim's box. 

## 2014-11-11 NOTE — Telephone Encounter (Signed)
Message left notifying patient and paperwork placed up front for pick up.

## 2014-11-11 NOTE — Telephone Encounter (Signed)
Pt dropped off leave of absence form that needs to be filled out. Please call [phone] when form is ready to be picked up. Placing on Kim's desk. Thanks.

## 2014-11-23 DIAGNOSIS — K5780 Diverticulitis of intestine, part unspecified, with perforation and abscess without bleeding: Secondary | ICD-10-CM | POA: Diagnosis not present

## 2014-11-24 DIAGNOSIS — K5732 Diverticulitis of large intestine without perforation or abscess without bleeding: Secondary | ICD-10-CM | POA: Diagnosis not present

## 2014-11-24 DIAGNOSIS — K5720 Diverticulitis of large intestine with perforation and abscess without bleeding: Secondary | ICD-10-CM | POA: Diagnosis not present

## 2014-11-26 ENCOUNTER — Encounter: Payer: Self-pay | Admitting: Family Medicine

## 2014-11-26 ENCOUNTER — Ambulatory Visit (INDEPENDENT_AMBULATORY_CARE_PROVIDER_SITE_OTHER): Payer: Medicare Other | Admitting: Family Medicine

## 2014-11-26 VITALS — BP 128/84 | HR 98 | Temp 97.8°F

## 2014-11-26 DIAGNOSIS — K5780 Diverticulitis of intestine, part unspecified, with perforation and abscess without bleeding: Secondary | ICD-10-CM

## 2014-11-26 DIAGNOSIS — R0600 Dyspnea, unspecified: Secondary | ICD-10-CM | POA: Diagnosis not present

## 2014-11-26 NOTE — Assessment & Plan Note (Signed)
Description matches this - but pt denies any other significant sxs of OSA Discussed option of referral for sleep eval - pt and dad will monitor for now given rarity of episodes. ESS = 2

## 2014-11-26 NOTE — Patient Instructions (Signed)
Change may 9th visit to physical. questionairre for sleep today. You are doing well.

## 2014-11-26 NOTE — Progress Notes (Signed)
BP 128/84 mmHg  Pulse 98  Temp(Src) 97.8 F (36.6 C) (Oral)  SpO2 77%   CC: 4 wk f/u visit  Subjective:    Patient ID: Edward Adkins, male    DOB: Aug 09, 1971, 44 y.o.   MRN: [id number]  HPI: Edward Adkins is a 44 y.o. male presenting on 11/26/2014 for Follow-up   See prior note for details - recent complicated perforated diverticulitis, had drain in place for several weeks.   Has seen Dr Ninfa Linden in f/u. Has f/u April 26th.  Colonoscopy has been scheduled for May 3rd with Dr Michail Sermon.   Back to work.  No abd pain, normal stools, no fevers.   Father notices at night time occasional choking sound like gasping for breath. He does snore. Endorses restorative sleeping. No daytime sleepiness. Doesn't fall asleep easily during day.   He does wear retainer at night time.   Relevant past medical, surgical, family and social history reviewed and updated as indicated. Interim medical history since our last visit reviewed. Allergies and medications reviewed and updated. Current Outpatient Prescriptions on File Prior to Visit  Medication Sig  . carbamazepine (TEGRETOL) 200 MG tablet Take 1 tablet (200 mg total) by mouth 3 (three) times daily.  Marland Kitchen HYDROcodone-acetaminophen (NORCO/VICODIN) 5-325 MG per tablet Take 1 tablet by mouth every 6 (six) hours as needed for moderate pain.  . Multiple Vitamin (MULTIVITAMIN) tablet Take 2 tablets by mouth daily.    No current facility-administered medications on file prior to visit.    Review of Systems Per HPI unless specifically indicated above     Objective:    BP 128/84 mmHg  Pulse 98  Temp(Src) 97.8 F (36.6 C) (Oral)  SpO2 77%  Wt Readings from Last 3 Encounters:  10/28/14 217 lb 8 oz (98.657 kg)  10/21/14 213 lb 3 oz (96.7 kg)  10/12/14 222 lb (100.699 kg)    Physical Exam  Constitutional: He appears well-developed and well-nourished. No distress.  HENT:  Mouth/Throat: Oropharynx is clear and moist. No oropharyngeal exudate.    Eyes: Conjunctivae and EOM are normal. Pupils are equal, round, and reactive to light. No scleral icterus.  Cardiovascular: Normal rate, regular rhythm, normal heart sounds and intact distal pulses.   No murmur heard. Pulmonary/Chest: Effort normal and breath sounds normal. No respiratory distress. He has no wheezes. He has no rales.  Abdominal: Soft. Bowel sounds are normal. He exhibits no distension and no mass. There is no tenderness. There is no rebound and no guarding.  Musculoskeletal: He exhibits no edema.  Skin: Skin is warm and dry. No rash noted.  Psychiatric: He has a normal mood and affect.  Nursing note and vitals reviewed.  Results for orders placed or performed during the hospital encounter of 10/13/14  Culture, routine-abscess  Result Value Ref Range   Specimen Description ABSCESS ABDOMEN    Special Requests Normal    Gram Stain      ABUNDANT WBC PRESENT,BOTH PMN AND MONONUCLEAR NO SQUAMOUS EPITHELIAL CELLS SEEN NO ORGANISMS SEEN Performed at [place] Performed at [facility]    Report Status 10/23/2014 FINAL   CBC with Differential  Result Value Ref Range   WBC 10.9 (H) 4.0 - 10.5 K/uL   RBC 4.60 4.22 - 5.81 MIL/uL   Hemoglobin 14.1 13.0 - 17.0 g/dL   HCT 41.0 39.0 - 52.0 %   MCV 89.1 78.0 - 100.0 fL  MCH 30.7 26.0 - 34.0 pg   MCHC 34.4 30.0 - 36.0 g/dL   RDW 12.7 11.5 - 15.5 %   Platelets 224 150 - 400 K/uL   Neutrophils Relative % 76 43 - 77 %   Neutro Abs 8.3 (H) 1.7 - 7.7 K/uL   Lymphocytes Relative 15 12 - 46 %   Lymphs Abs 1.7 0.7 - 4.0 K/uL   Monocytes Relative 8 3 - 12 %   Monocytes Absolute 0.9 0.1 - 1.0 K/uL   Eosinophils Relative 1 0 - 5 %   Eosinophils Absolute 0.1 0.0 - 0.7 K/uL   Basophils Relative 0 0 - 1 %   Basophils Absolute 0.0 0.0 - 0.1 K/uL  Comprehensive metabolic panel  Result Value Ref Range   Sodium 134 (L) 135 - 145 mmol/L   Potassium 4.0 3.5 - 5.1 mmol/L    Chloride 99 96 - 112 mmol/L   CO2 24 19 - 32 mmol/L   Glucose, Bld 112 (H) 70 - 99 mg/dL   BUN 18 6 - 23 mg/dL   Creatinine, Ser 1.04 0.50 - 1.35 mg/dL   Calcium 8.6 8.4 - 10.5 mg/dL   Total Protein 8.2 6.0 - 8.3 g/dL   Albumin 3.7 3.5 - 5.2 g/dL   AST 33 0 - 37 U/L   ALT 28 0 - 53 U/L   Alkaline Phosphatase 61 39 - 117 U/L   Total Bilirubin 0.8 0.3 - 1.2 mg/dL   GFR calc non Af Amer 88 (L) >90 mL/min   GFR calc Af Amer >90 >90 mL/min   Anion gap 11 5 - 15  Lipase, blood  Result Value Ref Range   Lipase 24 11 - 59 U/L  Urinalysis, Routine w reflex microscopic  Result Value Ref Range   Color, Urine YELLOW YELLOW   APPearance CLOUDY (A) CLEAR   Specific Gravity, Urine 1.015 1.005 - 1.030   pH 5.0 5.0 - 8.0   Glucose, UA NEGATIVE NEGATIVE mg/dL   Hgb urine dipstick MODERATE (A) NEGATIVE   Bilirubin Urine SMALL (A) NEGATIVE   Ketones, ur NEGATIVE NEGATIVE mg/dL   Protein, ur 100 (A) NEGATIVE mg/dL   Urobilinogen, UA 0.2 0.0 - 1.0 mg/dL   Nitrite NEGATIVE NEGATIVE   Leukocytes, UA NEGATIVE NEGATIVE  Urine microscopic-add on  Result Value Ref Range   Squamous Epithelial / LPF FEW (A) RARE   WBC, UA 0-2 <3 WBC/hpf   RBC / HPF 3-6 <3 RBC/hpf   Bacteria, UA FEW (A) RARE   Urine-Other MUCOUS PRESENT   Comprehensive metabolic panel  Result Value Ref Range   Sodium 137 135 - 145 mmol/L   Potassium 3.4 (L) 3.5 - 5.1 mmol/L   Chloride 104 96 - 112 mmol/L   CO2 22 19 - 32 mmol/L   Glucose, Bld 105 (H) 70 - 99 mg/dL   BUN 15 6 - 23 mg/dL   Creatinine, Ser 0.97 0.50 - 1.35 mg/dL   Calcium 8.1 (L) 8.4 - 10.5 mg/dL   Total Protein 7.0 6.0 - 8.3 g/dL   Albumin 3.1 (L) 3.5 - 5.2 g/dL   AST 29 0 - 37 U/L   ALT 31 0 - 53 U/L   Alkaline Phosphatase 52 39 - 117 U/L   Total Bilirubin 0.7 0.3 - 1.2 mg/dL   GFR calc non Af Amer >90 >90 mL/min   GFR calc Af Amer >90 >90 mL/min   Anion gap 11 5 - 15  CBC  Result Value Ref  Range   WBC 8.0 4.0 - 10.5 K/uL   RBC 4.09 (L) 4.22 - 5.81  MIL/uL   Hemoglobin 12.5 (L) 13.0 - 17.0 g/dL   HCT 36.7 (L) 39.0 - 52.0 %   MCV 89.7 78.0 - 100.0 fL   MCH 30.6 26.0 - 34.0 pg   MCHC 34.1 30.0 - 36.0 g/dL   RDW 12.6 11.5 - 15.5 %   Platelets 196 150 - 400 K/uL  Protime-INR  Result Value Ref Range   Prothrombin Time 15.4 (H) 11.6 - 15.2 seconds   INR 1.20 0.00 - 1.49  CBC  Result Value Ref Range   WBC 6.3 4.0 - 10.5 K/uL   RBC 4.30 4.22 - 5.81 MIL/uL   Hemoglobin 12.9 (L) 13.0 - 17.0 g/dL   HCT 39.5 39.0 - 52.0 %   MCV 91.9 78.0 - 100.0 fL   MCH 30.0 26.0 - 34.0 pg   MCHC 32.7 30.0 - 36.0 g/dL   RDW 12.4 11.5 - 15.5 %   Platelets 216 150 - 400 K/uL  Basic metabolic panel  Result Value Ref Range   Sodium 138 135 - 145 mmol/L   Potassium 4.2 3.5 - 5.1 mmol/L   Chloride 107 96 - 112 mmol/L   CO2 20 19 - 32 mmol/L   Glucose, Bld 78 70 - 99 mg/dL   BUN 15 6 - 23 mg/dL   Creatinine, Ser 0.87 0.50 - 1.35 mg/dL   Calcium 8.3 (L) 8.4 - 10.5 mg/dL   GFR calc non Af Amer >90 >90 mL/min   GFR calc Af Amer >90 >90 mL/min   Anion gap 11 5 - 15  CBC  Result Value Ref Range   WBC 9.4 4.0 - 10.5 K/uL   RBC 4.40 4.22 - 5.81 MIL/uL   Hemoglobin 13.3 13.0 - 17.0 g/dL   HCT 40.6 39.0 - 52.0 %   MCV 92.3 78.0 - 100.0 fL   MCH 30.2 26.0 - 34.0 pg   MCHC 32.8 30.0 - 36.0 g/dL   RDW 12.6 11.5 - 15.5 %   Platelets 244 150 - 400 K/uL  Comprehensive metabolic panel  Result Value Ref Range   Sodium 138 135 - 145 mmol/L   Potassium 4.1 3.5 - 5.1 mmol/L   Chloride 104 96 - 112 mmol/L   CO2 25 19 - 32 mmol/L   Glucose, Bld 94 70 - 99 mg/dL   BUN <5 (L) 6 - 23 mg/dL   Creatinine, Ser 1.00 0.50 - 1.35 mg/dL   Calcium 8.3 (L) 8.4 - 10.5 mg/dL   Total Protein 7.1 6.0 - 8.3 g/dL   Albumin 3.0 (L) 3.5 - 5.2 g/dL   AST 14 0 - 37 U/L   ALT 19 0 - 53 U/L   Alkaline Phosphatase 57 39 - 117 U/L   Total Bilirubin 0.5 0.3 - 1.2 mg/dL   GFR calc non Af Amer >90 >90 mL/min   GFR calc Af Amer >90 >90 mL/min   Anion gap 9 5 - 15  CBC  Result  Value Ref Range   WBC 7.9 4.0 - 10.5 K/uL   RBC 4.22 4.22 - 5.81 MIL/uL   Hemoglobin 12.8 (L) 13.0 - 17.0 g/dL   HCT 39.2 39.0 - 52.0 %   MCV 92.9 78.0 - 100.0 fL   MCH 30.3 26.0 - 34.0 pg   MCHC 32.7 30.0 - 36.0 g/dL   RDW 12.8 11.5 - 15.5 %   Platelets 276 150 - 400 K/uL  Basic metabolic panel  Result Value Ref Range   Sodium 138 135 - 145 mmol/L   Potassium 4.0 3.5 - 5.1 mmol/L   Chloride 105 96 - 112 mmol/L   CO2 21 19 - 32 mmol/L   Glucose, Bld 81 70 - 99 mg/dL   BUN 12 6 - 23 mg/dL   Creatinine, Ser 0.89 0.50 - 1.35 mg/dL   Calcium 8.4 8.4 - 10.5 mg/dL   GFR calc non Af Amer >90 >90 mL/min   GFR calc Af Amer >90 >90 mL/min   Anion gap 12 5 - 15      Assessment & Plan:   Problem List Items Addressed This Visit    Paroxysmal nocturnal dyspnea - Primary    Description matches this - but pt denies any other significant sxs of OSA Discussed option of referral for sleep eval - pt and dad will monitor for now given rarity of episodes. ESS = 2      Diverticulitis of intestine with perforation    Already has colonoscopy scheduled with Dr Michail Sermon for May. Overall seems to have fully recovered.          Follow up plan: Return if symptoms worsen or fail to improve.

## 2014-11-26 NOTE — Progress Notes (Signed)
Pre visit review using our clinic review tool, if applicable. No additional management support is needed unless otherwise documented below in the visit note. 

## 2014-11-26 NOTE — Assessment & Plan Note (Signed)
Already has colonoscopy scheduled with Dr Michail Sermon for May. Overall seems to have fully recovered.

## 2014-12-29 DIAGNOSIS — K5780 Diverticulitis of intestine, part unspecified, with perforation and abscess without bleeding: Secondary | ICD-10-CM | POA: Diagnosis not present

## 2015-01-05 ENCOUNTER — Encounter: Payer: Self-pay | Admitting: Family Medicine

## 2015-01-05 DIAGNOSIS — K5730 Diverticulosis of large intestine without perforation or abscess without bleeding: Secondary | ICD-10-CM | POA: Diagnosis not present

## 2015-01-05 DIAGNOSIS — D126 Benign neoplasm of colon, unspecified: Secondary | ICD-10-CM | POA: Diagnosis not present

## 2015-01-05 DIAGNOSIS — K640 First degree hemorrhoids: Secondary | ICD-10-CM | POA: Diagnosis not present

## 2015-01-05 DIAGNOSIS — D125 Benign neoplasm of sigmoid colon: Secondary | ICD-10-CM | POA: Diagnosis not present

## 2015-01-05 DIAGNOSIS — K5732 Diverticulitis of large intestine without perforation or abscess without bleeding: Secondary | ICD-10-CM | POA: Diagnosis not present

## 2015-01-05 DIAGNOSIS — D124 Benign neoplasm of descending colon: Secondary | ICD-10-CM | POA: Diagnosis not present

## 2015-01-11 ENCOUNTER — Encounter: Payer: Self-pay | Admitting: Family Medicine

## 2015-01-11 ENCOUNTER — Ambulatory Visit (INDEPENDENT_AMBULATORY_CARE_PROVIDER_SITE_OTHER): Payer: Medicare Other | Admitting: Family Medicine

## 2015-01-11 VITALS — BP 116/78 | HR 84 | Temp 98.2°F | Wt 219.2 lb

## 2015-01-11 DIAGNOSIS — K651 Peritoneal abscess: Secondary | ICD-10-CM | POA: Diagnosis not present

## 2015-01-11 DIAGNOSIS — G809 Cerebral palsy, unspecified: Secondary | ICD-10-CM

## 2015-01-11 DIAGNOSIS — E663 Overweight: Secondary | ICD-10-CM | POA: Diagnosis not present

## 2015-01-11 DIAGNOSIS — K5780 Diverticulitis of intestine, part unspecified, with perforation and abscess without bleeding: Secondary | ICD-10-CM

## 2015-01-11 NOTE — Progress Notes (Signed)
   BP 116/78 mmHg  Pulse 84  Temp(Src) 98.2 F (36.8 C) (Oral)  Wt 219 lb 4 oz (99.451 kg)   CC: f/u visit  Subjective:    Patient ID: Edward Adkins, male    DOB: 1971/06/27, 44 y.o.   MRN: [id number]  HPI: Edward Adkins is a 44 y.o. male presenting on 01/11/2015 for Follow-up   Colonoscopy by Dr Michail Sermon - May 3rd. Results not yet available. Found 4 small polyps, removed. Pending pathology.   Obesity - for exercise plays golf, mows yard. High fiber diet. Good water, fruits/vegetables daily.  Body mass index is 26.7 kg/(m^2).   No fevers/chills, abd pain, nausea/vomiting, diarrhea.  Relevant past medical, surgical, family and social history reviewed and updated as indicated. Interim medical history since our last visit reviewed. Allergies and medications reviewed and updated. Current Outpatient Prescriptions on File Prior to Visit  Medication Sig  . carbamazepine (TEGRETOL) 200 MG tablet Take 1 tablet (200 mg total) by mouth 3 (three) times daily.  . Multiple Vitamin (MULTIVITAMIN) tablet Take 2 tablets by mouth daily.    No current facility-administered medications on file prior to visit.    Review of Systems Per HPI unless specifically indicated above     Objective:    BP 116/78 mmHg  Pulse 84  Temp(Src) 98.2 F (36.8 C) (Oral)  Wt 219 lb 4 oz (99.451 kg)  Wt Readings from Last 3 Encounters:  01/11/15 219 lb 4 oz (99.451 kg)  10/28/14 217 lb 8 oz (98.657 kg)  10/21/14 213 lb 3 oz (96.7 kg)   Body mass index is 26.7 kg/(m^2).  Physical Exam  Constitutional: He appears well-developed and well-nourished. No distress.  HENT:  Mouth/Throat: Oropharynx is clear and moist. No oropharyngeal exudate.  Eyes: Conjunctivae and EOM are normal. Pupils are equal, round, and reactive to light. No scleral icterus.  Neck: Normal range of motion. Neck supple.  Cardiovascular: Normal rate, regular rhythm, normal heart sounds and intact distal pulses.   No murmur  heard. Pulmonary/Chest: Effort normal and breath sounds normal. No respiratory distress. He has no wheezes. He has no rales.  Musculoskeletal: He exhibits no edema.  Lymphadenopathy:    He has no cervical adenopathy.  Skin: Skin is warm and dry. No rash noted.  Psychiatric: He has a normal mood and affect.  Nursing note and vitals reviewed.      Assessment & Plan:   Problem List Items Addressed This Visit    Overweight    Discussed sugar intake, recommended avoid added sugars in diet including cookies, ice cream, regular soft drinks and sweet tea.      Intra-abdominal abscess - Primary    Seems to have fully recovered.      Diverticulitis of intestine with perforation    Await records from recent colonoscopy. Reviewed high fiber diet, recommended increased fruit/vegetable intake      Cerebral palsy    Highly functional job at [employer].          Follow up plan: Return in about 6 months (around 07/14/2015), or as needed, for annual exam, prior fasting for blood work.

## 2015-01-11 NOTE — Assessment & Plan Note (Signed)
Discussed sugar intake, recommended avoid added sugars in diet including cookies, ice cream, regular soft drinks and sweet tea.

## 2015-01-11 NOTE — Assessment & Plan Note (Signed)
Highly functional job at [employer].

## 2015-01-11 NOTE — Progress Notes (Signed)
Pre visit review using our clinic review tool, if applicable. No additional management support is needed unless otherwise documented below in the visit note. 

## 2015-01-11 NOTE — Patient Instructions (Addendum)
You are doing well today. Watch added sugars in diet (cookies, ice cream, sodas and sweet tea). Return as needed or in 6 months to a year for a physical.

## 2015-01-11 NOTE — Assessment & Plan Note (Signed)
Seems to have fully recovered.

## 2015-01-11 NOTE — Assessment & Plan Note (Addendum)
Await records from recent colonoscopy. Reviewed high fiber diet, recommended increased fruit/vegetable intake

## 2015-02-02 DIAGNOSIS — K5780 Diverticulitis of intestine, part unspecified, with perforation and abscess without bleeding: Secondary | ICD-10-CM | POA: Diagnosis not present

## 2015-08-02 DIAGNOSIS — Z23 Encounter for immunization: Secondary | ICD-10-CM | POA: Diagnosis not present

## 2015-08-09 ENCOUNTER — Encounter: Payer: Self-pay | Admitting: Nurse Practitioner

## 2015-08-09 ENCOUNTER — Ambulatory Visit (INDEPENDENT_AMBULATORY_CARE_PROVIDER_SITE_OTHER): Payer: Medicare Other | Admitting: Nurse Practitioner

## 2015-08-09 VITALS — BP 127/82 | HR 81 | Ht 72.0 in | Wt 226.0 lb

## 2015-08-09 DIAGNOSIS — Z5181 Encounter for therapeutic drug level monitoring: Secondary | ICD-10-CM | POA: Diagnosis not present

## 2015-08-09 DIAGNOSIS — G40309 Generalized idiopathic epilepsy and epileptic syndromes, not intractable, without status epilepticus: Secondary | ICD-10-CM

## 2015-08-09 DIAGNOSIS — G809 Cerebral palsy, unspecified: Secondary | ICD-10-CM

## 2015-08-09 DIAGNOSIS — G40219 Localization-related (focal) (partial) symptomatic epilepsy and epileptic syndromes with complex partial seizures, intractable, without status epilepticus: Secondary | ICD-10-CM | POA: Diagnosis not present

## 2015-08-09 MED ORDER — CARBAMAZEPINE 200 MG PO TABS: 200.0000 mg | ORAL_TABLET | Freq: Three times a day (TID) | ORAL | Status: DC

## 2015-08-09 NOTE — Progress Notes (Addendum)
[facility]  PATIENT: Edward Adkins DOB: [birth year]   REASON FOR VISIT: Follow-up for epilepsy complex partial and generalized epilepsy, cerebral palsy HISTORY FROM: Patient and father    HISTORY OF PRESENT ILLNESS:Mr. Edward Adkins, 44 year old male returns for followup. He has a history of cerebral palsy, mild mental retardation and seizure disorder. He has been followed at this office since 1984 last seizure event 1991. He is currently well-controlled on carbamazepine 200 (3) times daily without side effects. He needs labs today. He has not fallen, no missed doses of medication. No new neurologic complaints. He is accompanied by his father. He returns for reevaluation. He needs medication refills   HISTORY: of cerebral palsy, mild mental retardation and seizure disorder. He has been followed at [facility] since 1984, onset of seizure disorder at age 88. 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 He was originally on phenobarbital and developed a rash, switched to Dilantin with recurrent seizures. Initiated on carbamazepine in 1991without further seizure activity since that time. Denies any side effects to his medications, any daytime drowsiness, feelings of being off balance, no missed doses of his medication, and no falls.     REVIEW OF SYSTEMS: Full 14 system review of systems performed and notable only for those listed, all others are neg:  Constitutional: neg  Cardiovascular: neg Ear/Nose/Throat: neg  Skin: neg Eyes: neg Respiratory: neg Gastroitestinal: neg  Hematology/Lymphatic: neg  Endocrine: neg Musculoskeletal:neg Allergy/Immunology: neg Neurological: neg Psychiatric: neg Sleep : neg   ALLERGIES: Allergies  Allergen Reactions  . Phenobarbital     REACTION: rash    HOME MEDICATIONS: Outpatient Prescriptions Prior to Visit  Medication Sig Dispense  Refill  . carbamazepine (TEGRETOL) 200 MG tablet Take 1 tablet (200 mg total) by mouth 3 (three) times daily. 90 tablet 11  . Multiple Vitamin (MULTIVITAMIN) tablet Take 2 tablets by mouth daily.      No facility-administered medications prior to visit.    PAST MEDICAL HISTORY: Past Medical History  Diagnosis Date  . Complex partial seizure disorder ([place]) 05/78    Head CT's multiple-WNL, EEG R temporal lobe discharge ([facility])  . Cerebral palsy ([place])   . Mild mental retardation   . Diverticulitis 10/14/2014  . Perforated bowel ([place]) 10/14/2014  . Diverticulosis     in hospital 10/2014    PAST SURGICAL HISTORY: History reviewed. No pertinent past surgical history.  FAMILY HISTORY: Family History  Problem Relation Age of Onset  . Cancer Mother 50    Leukemia  . Cancer Father 71    Prostate ca  . CAD Father   . Diabetes Father     SOCIAL HISTORY: Social History   Social History  . Marital Status: Single    Spouse Name: N/A  . Number of Children: 0  . Years of Education: 12   Occupational History  .     Social History Main Topics  . Smoking status: Never Smoker   . Smokeless tobacco: Never Used  . Alcohol Use: No  . Drug Use: No  . Sexual Activity: Not on file   Other Topics Concern  . Not on file   Social History Narrative   Patient is single and lives with his father.   Patient drinks two caffeine drinks daily.   Patient is ambi-dextrous.   Patient has a high school education.   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  PHYSICAL EXAM  Filed Vitals:   08/09/15 1423  BP: 127/82  Pulse: 81  Height: 6' (1.829 m)  Weight: 226 lb (102.513 kg)   Body mass index is 30.64 kg/(m^2). Generalized: Well developed, in no acute distress  Head: normocephalic and atraumatic,. Oropharynx benign  Neurological examination   Mentation: Alert oriented to time, place, history taking. Follows all commands, mild speech impairment but understandable.  Cranial  nerve II-XII: Pupils were equal round reactive to light. Left exotropia. extraocular movements otherwise full, visual field were full on confrontational test. Facial sensation and strength were normal. hearing was intact to finger rubbing bilaterally. Uvula tongue midline. head turning and shoulder shrug and were normal and symmetric.Tongue protrusion into cheek strength was normal. Motor: normal bulk and tone, full strength in the BUE, BLE, . No focal weakness Coordination: finger-nose-finger, heel-to-shin bilaterally, no dysmetria Gait and Station: Rising up from seated position without assistance, normal stance, moderate stride, good arm swing, smooth turning, able to perform tiptoe, and heel walking without difficulty. Tandem gait steady   DIAGNOSTIC DATA (LABS, IMAGING, TESTING) - I reviewed patient records, labs, notes, testing and imaging myself where available.  Lab Results  Component Value Date   WBC 7.9 10/20/2014   HGB 12.8* 10/20/2014   HCT 39.2 10/20/2014   MCV 92.9 10/20/2014   PLT 276 10/20/2014      Component Value Date/Time   NA 138 10/20/2014 0533   NA 143 08/06/2014 1426   K 4.0 10/20/2014 0533   CL 105 10/20/2014 0533   CO2 21 10/20/2014 0533   GLUCOSE 81 10/20/2014 0533   GLUCOSE 88 08/06/2014 1426   BUN 12 10/20/2014 0533   BUN 11 08/06/2014 1426   CREATININE 0.89 10/20/2014 0533   CALCIUM 8.4 10/20/2014 0533   PROT 7.1 10/17/2014 0445   PROT 7.9 08/06/2014 1426   ALBUMIN 3.0* 10/17/2014 0445   ALBUMIN 4.7 08/06/2014 1426   AST 14 10/17/2014 0445   ALT 19 10/17/2014 0445   ALKPHOS 57 10/17/2014 0445   BILITOT 0.5 10/17/2014 0445   GFRNONAA >90 10/20/2014 0533   GFRAA >90 10/20/2014 0533       ASSESSMENT AND PLAN  44 y.o. year old male  has a past medical history of Complex partial seizure disorder ([place]) (05/78); Cerebral palsy ([place]); Mild mental retardation;  here to follow-up. The patient is a current patient of Dr. Lonni Fix  who is out of the  office today . This note is sent to the work in doctor.     Continue carbamazepine at current dose refilled for the next year Check labs today, CBC, CMP Follow-up yearly and when necessary, next with Dr. Fabio Neighbors, [facility], [facility], [facility] [address] [place], [place] [address] [phone]  I reviewed the above note and documentation by the Nurse Practitioner and agree with the history, physical exam, assessment and plan as outlined above. I was immediately available for face-to-face consultation. Star Age, MD, PhD [facility])

## 2015-08-09 NOTE — Patient Instructions (Signed)
Continue carbamazepine at current dose refilled for the next year Check labs today, CBC, CMP Follow-up yearly and when necessary,

## 2015-08-10 LAB — COMPREHENSIVE METABOLIC PANEL
A/G RATIO: 1.5 (ref 1.1–2.5)
ALT: 16 IU/L (ref 0–44)
AST: 16 IU/L (ref 0–40)
Albumin: 4.6 g/dL (ref 3.5–5.5)
Alkaline Phosphatase: 64 IU/L (ref 39–117)
BUN/Creatinine Ratio: 17 (ref 9–20)
BUN: 13 mg/dL (ref 6–24)
Bilirubin Total: <0.2 mg/dL (ref 0.0–1.2)
CALCIUM: 9.2 mg/dL (ref 8.7–10.2)
CHLORIDE: 101 mmol/L (ref 97–106)
CO2: 26 mmol/L (ref 18–29)
Creatinine, Ser: 0.76 mg/dL (ref 0.76–1.27)
GFR calc Af Amer: 130 mL/min/{1.73_m2} (ref >59)
GFR calc non Af Amer: 113 mL/min/{1.73_m2} (ref >59)
GLUCOSE: 85 mg/dL (ref 65–99)
Globulin, Total: 3 g/dL (ref 1.5–4.5)
Potassium: 5.1 mmol/L (ref 3.5–5.2)
Sodium: 143 mmol/L (ref 136–144)
Total Protein: 7.6 g/dL (ref 6.0–8.5)

## 2015-08-10 LAB — CBC WITH DIFFERENTIAL/PLATELET
BASOS ABS: 0 10*3/uL (ref 0.0–0.2)
Basos: 0 %
EOS (ABSOLUTE): 0.2 10*3/uL (ref 0.0–0.4)
Eos: 3 %
Hematocrit: 44.3 % (ref 37.5–51.0)
Hemoglobin: 15 g/dL (ref 12.6–17.7)
IMMATURE GRANS (ABS): 0 10*3/uL (ref 0.0–0.1)
IMMATURE GRANULOCYTES: 1 %
LYMPHS: 33 %
Lymphocytes Absolute: 2.5 10*3/uL (ref 0.7–3.1)
MCH: 30.7 pg (ref 26.6–33.0)
MCHC: 33.9 g/dL (ref 31.5–35.7)
MCV: 91 fL (ref 79–97)
MONOCYTES: 8 %
Monocytes Absolute: 0.6 10*3/uL (ref 0.1–0.9)
NEUTROS PCT: 55 %
Neutrophils Absolute: 4.1 10*3/uL (ref 1.4–7.0)
PLATELETS: 240 10*3/uL (ref 150–379)
RBC: 4.88 x10E6/uL (ref 4.14–5.80)
RDW: 12.8 % (ref 12.3–15.4)
WBC: 7.6 10*3/uL (ref 3.4–10.8)

## 2015-08-11 ENCOUNTER — Telehealth: Payer: Self-pay | Admitting: *Deleted

## 2015-08-11 NOTE — Telephone Encounter (Signed)
I called and spoke to pts father and let him know that the pts lab work was normal.   He verbalized understanding.

## 2016-02-10 ENCOUNTER — Telehealth: Payer: Self-pay | Admitting: Family Medicine

## 2016-02-10 NOTE — Telephone Encounter (Signed)
Left message asking pt to call office per thn list Please make follow up or cpx with pcp

## 2016-03-07 ENCOUNTER — Other Ambulatory Visit: Payer: Self-pay | Admitting: Family Medicine

## 2016-03-07 DIAGNOSIS — E785 Hyperlipidemia, unspecified: Secondary | ICD-10-CM

## 2016-03-07 DIAGNOSIS — G40219 Localization-related (focal) (partial) symptomatic epilepsy and epileptic syndromes with complex partial seizures, intractable, without status epilepticus: Secondary | ICD-10-CM

## 2016-03-08 ENCOUNTER — Other Ambulatory Visit (INDEPENDENT_AMBULATORY_CARE_PROVIDER_SITE_OTHER): Payer: Medicare Other

## 2016-03-08 DIAGNOSIS — G40219 Localization-related (focal) (partial) symptomatic epilepsy and epileptic syndromes with complex partial seizures, intractable, without status epilepticus: Secondary | ICD-10-CM

## 2016-03-08 DIAGNOSIS — E785 Hyperlipidemia, unspecified: Secondary | ICD-10-CM | POA: Diagnosis not present

## 2016-03-08 LAB — BASIC METABOLIC PANEL
BUN: 16 mg/dL (ref 6–23)
CO2: 27 mEq/L (ref 19–32)
CREATININE: 0.92 mg/dL (ref 0.40–1.50)
Calcium: 9.6 mg/dL (ref 8.4–10.5)
Chloride: 102 mEq/L (ref 96–112)
GFR: 95.26 mL/min (ref >60.00)
Glucose, Bld: 92 mg/dL (ref 70–99)
Potassium: 4 mEq/L (ref 3.5–5.1)
Sodium: 136 mEq/L (ref 135–145)

## 2016-03-08 LAB — LIPID PANEL
CHOL/HDL RATIO: 5
CHOLESTEROL: 202 mg/dL — AB (ref 0–200)
HDL: 37 mg/dL — AB (ref >39.00)
LDL CALC: 126 mg/dL — AB (ref 0–99)
NonHDL: 164.64
TRIGLYCERIDES: 191 mg/dL — AB (ref 0.0–149.0)
VLDL: 38.2 mg/dL (ref 0.0–40.0)

## 2016-03-09 LAB — CARBAMAZEPINE LEVEL, TOTAL: CARBAMAZEPINE LVL: 6.8 mg/L (ref 4.0–12.0)

## 2016-03-13 ENCOUNTER — Ambulatory Visit (INDEPENDENT_AMBULATORY_CARE_PROVIDER_SITE_OTHER): Payer: Medicare Other | Admitting: Family Medicine

## 2016-03-13 ENCOUNTER — Encounter: Payer: Self-pay | Admitting: Family Medicine

## 2016-03-13 VITALS — BP 114/78 | HR 96 | Temp 98.4°F | Ht 71.5 in | Wt 214.5 lb

## 2016-03-13 DIAGNOSIS — E785 Hyperlipidemia, unspecified: Secondary | ICD-10-CM | POA: Diagnosis not present

## 2016-03-13 DIAGNOSIS — Z0000 Encounter for general adult medical examination without abnormal findings: Secondary | ICD-10-CM | POA: Insufficient documentation

## 2016-03-13 DIAGNOSIS — G40219 Localization-related (focal) (partial) symptomatic epilepsy and epileptic syndromes with complex partial seizures, intractable, without status epilepticus: Secondary | ICD-10-CM | POA: Diagnosis not present

## 2016-03-13 DIAGNOSIS — K5730 Diverticulosis of large intestine without perforation or abscess without bleeding: Secondary | ICD-10-CM

## 2016-03-13 DIAGNOSIS — Z7189 Other specified counseling: Secondary | ICD-10-CM | POA: Diagnosis not present

## 2016-03-13 DIAGNOSIS — R6250 Unspecified lack of expected normal physiological development in childhood: Secondary | ICD-10-CM | POA: Insufficient documentation

## 2016-03-13 NOTE — Progress Notes (Signed)
BP 114/78 mmHg  Pulse 96  Temp(Src) 98.4 F (36.9 C) (Oral)  Ht 5' 11.5" (1.816 m)  Wt 214 lb 8 oz (97.297 kg)  BMI 29.50 kg/m2   CC: medicare wellness visit  Subjective:    Patient ID: Edward Adkins, male    DOB: 05/06/1971, 45 y.o.   MRN: [id number]  HPI: Edward Adkins is a 45 y.o. male presenting on 03/13/2016 for Annual Exam   Hearing screen - passed Vision screen - passed Fall risk screen - passed Depression screen - passed  Preventative: Colon cancer screening - not due yet Prostate cancer screening - not due yet. +fmhx Lung cancer screening - not indicated Flu shot - yearly Tetanus shot - ? Pneumonia shot - not due yet Shingles shot - not due yet Advanced directive discussion - would want father to be [facility] proxy.  Seat belt use discussed Sunscreen use and skin screen discussed   Patient is single and lives with his father.  Patient drinks two caffeine drinks daily.  Patient is ambi-dextrous.  Patient has a high school education.  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  Activity: plays golf, mows yard.  Diet: drinking more water.  Relevant past medical, surgical, family and social history reviewed and updated as indicated. Interim medical history since our last visit reviewed. Allergies and medications reviewed and updated. Current Outpatient Prescriptions on File Prior to Visit  Medication Sig  . carbamazepine (TEGRETOL) 200 MG tablet Take 1 tablet (200 mg total) by mouth 3 (three) times daily.  . Multiple Vitamin (MULTIVITAMIN) tablet Take 2 tablets by mouth daily.    No current facility-administered medications on file prior to visit.    Review of Systems Per HPI unless specifically indicated in ROS section     Objective:    BP 114/78 mmHg  Pulse 96  Temp(Src) 98.4 F (36.9 C) (Oral)  Ht 5' 11.5" (1.816 m)  Wt 214 lb 8 oz (97.297 kg)  BMI 29.50 kg/m2  Wt Readings from Last 3 Encounters:  03/13/16 214 lb 8 oz (97.297 kg)  08/09/15 226 lb (102.513  kg)  01/11/15 219 lb 4 oz (99.451 kg)    Physical Exam  Constitutional: He is oriented to person, place, and time. He appears well-developed and well-nourished. No distress.  HENT:  Head: Normocephalic and atraumatic.  Right Ear: Hearing, tympanic membrane, external ear and ear canal normal.  Left Ear: Hearing, tympanic membrane, external ear and ear canal normal.  Nose: Nose normal.  Mouth/Throat: Uvula is midline, oropharynx is clear and moist and mucous membranes are normal. No oropharyngeal exudate, posterior oropharyngeal edema or posterior oropharyngeal erythema.  Eyes: Conjunctivae and EOM are normal. Pupils are equal, round, and reactive to light. No scleral icterus.  Neck: Normal range of motion. Neck supple. No thyromegaly present.  Cardiovascular: Normal rate, regular rhythm, normal heart sounds and intact distal pulses.   No murmur heard. Pulses:      Radial pulses are 2+ on the right side, and 2+ on the left side.  Pulmonary/Chest: Effort normal and breath sounds normal. No respiratory distress. He has no wheezes. He has no rales.  Abdominal: Soft. Bowel sounds are normal. He exhibits no distension and no mass. There is no tenderness. There is no rebound and no guarding.  Musculoskeletal: Normal range of motion. He exhibits no edema.  Lymphadenopathy:    He has no cervical adenopathy.  Neurological: He is alert and oriented to person, place, and time.  CN grossly  intact, station and gait intact Recall 3/3 Calculation 5/5 serial 3s   Skin: Skin is warm and dry. No rash noted.  Psychiatric: He has a normal mood and affect. His behavior is normal. Judgment and thought content normal.  Nursing note and vitals reviewed.  Results for orders placed or performed in visit on 03/08/16  Lipid panel  Result Value Ref Range   Cholesterol 202 (H) 0 - 200 mg/dL   Triglycerides 191.0 (H) 0.0 - 149.0 mg/dL   HDL 37.00 (L) >39.00 mg/dL   VLDL 38.2 0.0 - 40.0 mg/dL   LDL Cholesterol  126 (H) 0 - 99 mg/dL   Total CHOL/HDL Ratio 5    NonHDL 99991111   Basic metabolic panel  Result Value Ref Range   Sodium 136 135 - 145 mEq/L   Potassium 4.0 3.5 - 5.1 mEq/L   Chloride 102 96 - 112 mEq/L   CO2 27 19 - 32 mEq/L   Glucose, Bld 92 70 - 99 mg/dL   BUN 16 6 - 23 mg/dL   Creatinine, Ser 0.92 0.40 - 1.50 mg/dL   Calcium 9.6 8.4 - 10.5 mg/dL   GFR 95.26 >60.00 mL/min  Carbamazepine level, total  Result Value Ref Range   Carbamazepine Lvl 6.8 4.0 - 12.0 mg/L      Assessment & Plan:   Problem List Items Addressed This Visit    Complex partial seizures ([place])    Continue tegretol. Levels normal.       Dyslipidemia    Triglyceride levels remain elevated Discussed healthy diet changes to improve readings.       Medicare annual wellness visit, initial - Primary    I have personally reviewed the Medicare Annual Wellness questionnaire and have noted 1. The patient's medical and social history 2. Their use of alcohol, tobacco or illicit drugs 3. Their current medications and supplements 4. The patient's functional ability including ADL's, fall risks, home safety risks and hearing or visual impairment. Cognitive function has been assessed and addressed as indicated.  5. Diet and physical activity 6. Evidence for depression or mood disorders The patients weight, height, BMI have been recorded in the chart. I have made referrals, counseling and provided education to the patient based on review of the above and I have provided the pt with a written personalized care plan for preventive services. Provider list updated.. See scanned questionairre as needed for further documentation. Reviewed preventative protocols and updated unless pt declined.       Advanced care planning/counseling discussion    Advanced directive discussion - would want father to be [facility] proxy.       Mild developmental delay    High functioning - works at [employer].      Diverticulosis       Follow  up plan: Return in about 1 year (around 03/13/2017), or as needed, for medicare wellness visit.  [name], MD

## 2016-03-13 NOTE — Assessment & Plan Note (Signed)
Continue tegretol. Levels normal.

## 2016-03-13 NOTE — Assessment & Plan Note (Signed)

## 2016-03-13 NOTE — Assessment & Plan Note (Addendum)
Triglyceride levels remain elevated Discussed healthy diet changes to improve readings.

## 2016-03-13 NOTE — Assessment & Plan Note (Signed)
Advanced directive discussion - would want father to be [facility] proxy.  

## 2016-03-13 NOTE — Assessment & Plan Note (Signed)
High functioning - works at [employer].

## 2016-03-13 NOTE — Patient Instructions (Addendum)
Check at home when your last tetanus shot was. Usually we recommend every 10 years, or right away if any dirty wound or wound caused by metal.  Consider setting up health care power of attorney if not already done.  Return in 1 year for next medicare wellness visit.  You are doing well today.   Health Maintenance, Male A healthy lifestyle and preventative care can promote health and wellness.  Maintain regular health, dental, and eye exams.  Eat a healthy diet. Foods like vegetables, fruits, whole grains, low-fat dairy products, and lean protein foods contain the nutrients you need and are low in calories. Decrease your intake of foods high in solid fats, added sugars, and salt. Get information about a proper diet from your health care provider, if necessary.  Regular physical exercise is one of the most important things you can do for your health. Most adults should get at least 150 minutes of moderate-intensity exercise (any activity that increases your heart rate and causes you to sweat) each week. In addition, most adults need muscle-strengthening exercises on 2 or more days a week.   Maintain a healthy weight. The body mass index (BMI) is a screening tool to identify possible weight problems. It provides an estimate of body fat based on height and weight. Your health care provider can find your BMI and can help you achieve or maintain a healthy weight. For males 20 years and older:  A BMI below 18.5 is considered underweight.  A BMI of 18.5 to 24.9 is normal.  A BMI of 25 to 29.9 is considered overweight.  A BMI of 30 and above is considered obese.  Maintain normal blood lipids and cholesterol by exercising and minimizing your intake of saturated fat. Eat a balanced diet with plenty of fruits and vegetables. Blood tests for lipids and cholesterol should begin at age 47 and be repeated every 5 years. If your lipid or cholesterol levels are high, you are over age 40, or you are at high  risk for heart disease, you may need your cholesterol levels checked more frequently.Ongoing high lipid and cholesterol levels should be treated with medicines if diet and exercise are not working.  If you smoke, find out from your health care provider how to quit. If you do not use tobacco, do not start.  Lung cancer screening is recommended for adults aged 74-80 years who are at high risk for developing lung cancer because of a history of smoking. A yearly low-dose CT scan of the lungs is recommended for people who have at least a 30-pack-year history of smoking and are current smokers or have quit within the past 15 years. A pack year of smoking is smoking an average of 1 pack of cigarettes a day for 1 year (for example, a 30-pack-year history of smoking could mean smoking 1 pack a day for 30 years or 2 packs a day for 15 years). Yearly screening should continue until the smoker has stopped smoking for at least 15 years. Yearly screening should be stopped for people who develop a health problem that would prevent them from having lung cancer treatment.  If you choose to drink alcohol, do not have more than 2 drinks per day. One drink is considered to be 12 oz (360 mL) of beer, 5 oz (150 mL) of wine, or 1.5 oz (45 mL) of liquor.  Avoid the use of street drugs. Do not share needles with anyone. Ask for help if you need support or instructions  about stopping the use of drugs.  High blood pressure causes heart disease and increases the risk of stroke. High blood pressure is more likely to develop in:  People who have blood pressure in the end of the normal range (100-139/85-89 mm Hg).  People who are overweight or obese.  People who are African American.  If you are 47-31 years of age, have your blood pressure checked every 3-5 years. If you are 58 years of age or older, have your blood pressure checked every year. You should have your blood pressure measured twice--once when you are at a hospital  or clinic, and once when you are not at a hospital or clinic. Record the average of the two measurements. To check your blood pressure when you are not at a hospital or clinic, you can use:  An automated blood pressure machine at a pharmacy.  A home blood pressure monitor.  If you are 87-47 years old, ask your health care provider if you should take aspirin to prevent heart disease.  Diabetes screening involves taking a blood sample to check your fasting blood sugar level. This should be done once every 3 years after age 65 if you are at a normal weight and without risk factors for diabetes. Testing should be considered at a younger age or be carried out more frequently if you are overweight and have at least 1 risk factor for diabetes.  Colorectal cancer can be detected and often prevented. Most routine colorectal cancer screening begins at the age of 14 and continues through age 54. However, your health care provider may recommend screening at an earlier age if you have risk factors for colon cancer. On a yearly basis, your health care provider may provide home test kits to check for hidden blood in the stool. A small camera at the end of a tube may be used to directly examine the colon (sigmoidoscopy or colonoscopy) to detect the earliest forms of colorectal cancer. Talk to your health care provider about this at age 52 when routine screening begins. A direct exam of the colon should be repeated every 5-10 years through age 33, unless early forms of precancerous polyps or small growths are found.  People who are at an increased risk for hepatitis B should be screened for this virus. You are considered at high risk for hepatitis B if:  You were born in a country where hepatitis B occurs often. Talk with your health care provider about which countries are considered high risk.  Your parents were born in a high-risk country and you have not received a shot to protect against hepatitis B (hepatitis B  vaccine).  You have HIV or AIDS.  You use needles to inject street drugs.  You live with, or have sex with, someone who has hepatitis B.  You are a man who has sex with other men (MSM).  You get hemodialysis treatment.  You take certain medicines for conditions like cancer, organ transplantation, and autoimmune conditions.  Hepatitis C blood testing is recommended for all people born from [birth year] through 1963 and any individual with known risk factors for hepatitis C.  Healthy men should no longer receive prostate-specific antigen (PSA) blood tests as part of routine cancer screening. Talk to your health care provider about prostate cancer screening.  Testicular cancer screening is not recommended for adolescents or adult males who have no symptoms. Screening includes self-exam, a health care provider exam, and other screening tests. Consult with your health care provider  about any symptoms you have or any concerns you have about testicular cancer.  Practice safe sex. Use condoms and avoid high-risk sexual practices to reduce the spread of sexually transmitted infections (STIs).  You should be screened for STIs, including gonorrhea and chlamydia if:  You are sexually active and are younger than 24 years.  You are older than 24 years, and your health care provider tells you that you are at risk for this type of infection.  Your sexual activity has changed since you were last screened, and you are at an increased risk for chlamydia or gonorrhea. Ask your health care provider if you are at risk.  If you are at risk of being infected with HIV, it is recommended that you take a prescription medicine daily to prevent HIV infection. This is called pre-exposure prophylaxis (PrEP). You are considered at risk if:  You are a man who has sex with other men (MSM).  You are a heterosexual man who is sexually active with multiple partners.  You take drugs by injection.  You are sexually active  with a partner who has HIV.  Talk with your health care provider about whether you are at high risk of being infected with HIV. If you choose to begin PrEP, you should first be tested for HIV. You should then be tested every 3 months for as long as you are taking PrEP.  Use sunscreen. Apply sunscreen liberally and repeatedly throughout the day. You should seek shade when your shadow is shorter than you. Protect yourself by wearing long sleeves, pants, a wide-brimmed hat, and sunglasses year round whenever you are outdoors.  Tell your health care provider of new moles or changes in moles, especially if there is a change in shape or color. Also, tell your health care provider if a mole is larger than the size of a pencil eraser.  A one-time screening for abdominal aortic aneurysm (AAA) and surgical repair of large AAAs by ultrasound is recommended for men aged 23-75 years who are current or former smokers.  Stay current with your vaccines (immunizations).   This information is not intended to replace advice given to you by your health care provider. Make sure you discuss any questions you have with your health care provider.   Document Released: 02/17/2008 Document Revised: 09/11/2014 Document Reviewed: 01/16/2011 Elsevier Interactive Patient Education [employer].

## 2016-03-13 NOTE — Progress Notes (Signed)
Pre visit review using our clinic review tool, if applicable. No additional management support is needed unless otherwise documented below in the visit note. 

## 2016-05-29 ENCOUNTER — Encounter: Payer: Self-pay | Admitting: *Deleted

## 2016-05-29 NOTE — Progress Notes (Unsigned)
DMV form completed.  Sent to [facility]/NP for review and signature.  Pt has upcoming appt in December 08-10-16 at 1445 with Dr. Leta Baptist.

## 2016-05-31 DIAGNOSIS — Z0289 Encounter for other administrative examinations: Secondary | ICD-10-CM

## 2016-06-15 DIAGNOSIS — Z23 Encounter for immunization: Secondary | ICD-10-CM | POA: Diagnosis not present

## 2016-06-23 IMAGING — CT CT ABD-PELV W/ CM
2 of 5 series · 17 of 46 positions shown, 19 images · IV contrast (Omnipaque 300)
Comparison: None.

CLINICAL DATA: Low abdominal pain and fever.

EXAM:
CT ABDOMEN AND PELVIS WITH CONTRAST
TECHNIQUE: Multidetector CT imaging of the abdomen and pelvis was performed
using the standard protocol following bolus administration of
intravenous contrast.
CONTRAST:  100mL OMNIPAQUE IOHEXOL 300 MG/ML  SOLN

[Series 2: abd/ pel 5mm · axial · 0.77mm/px · z∈[-502,-62]mm · 14 of 100 slices shown, 16 images]
[im 6/100  soft-tissue]
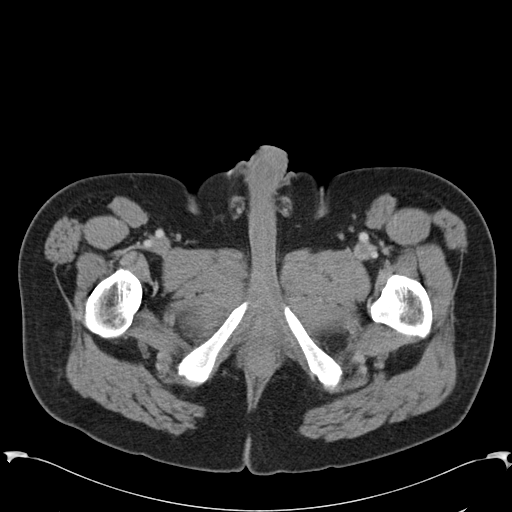
[im 6/100  bone]
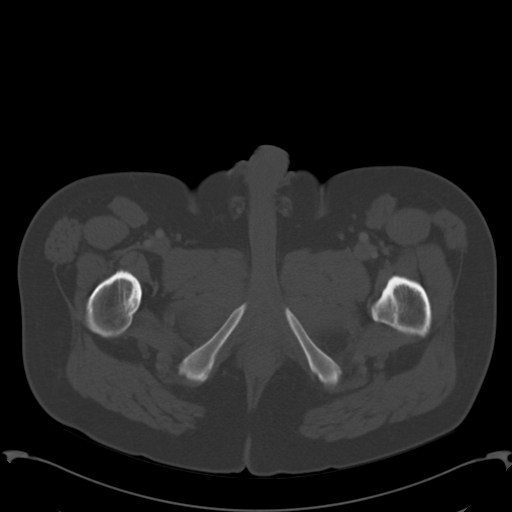
[im 11/100  soft-tissue]
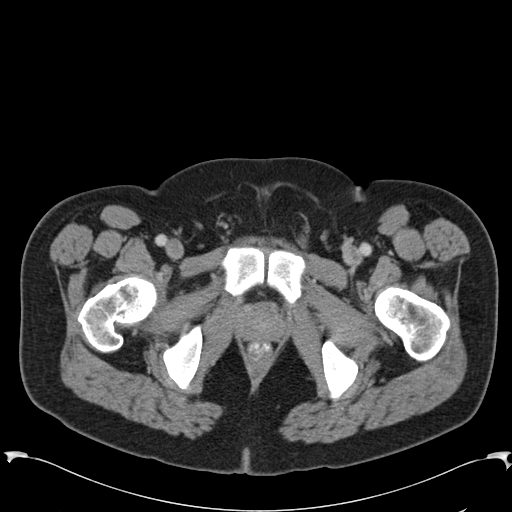
[im 21/100  soft-tissue]
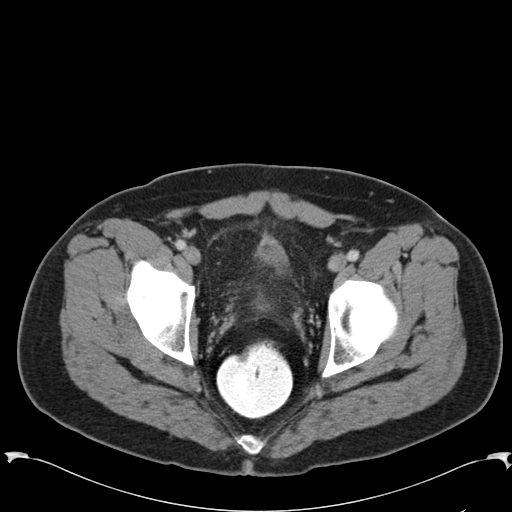
[im 27/100  soft-tissue]
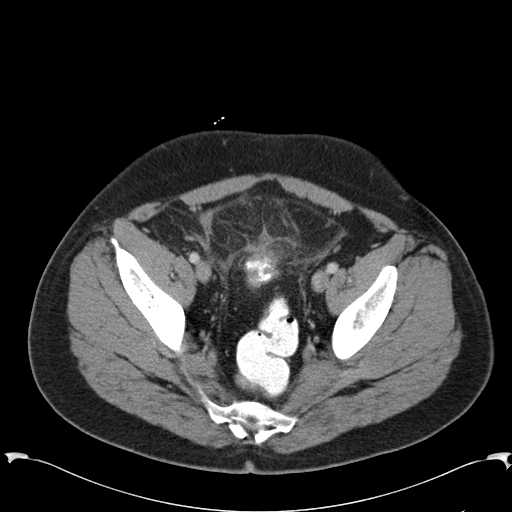
[im 32/100  soft-tissue]
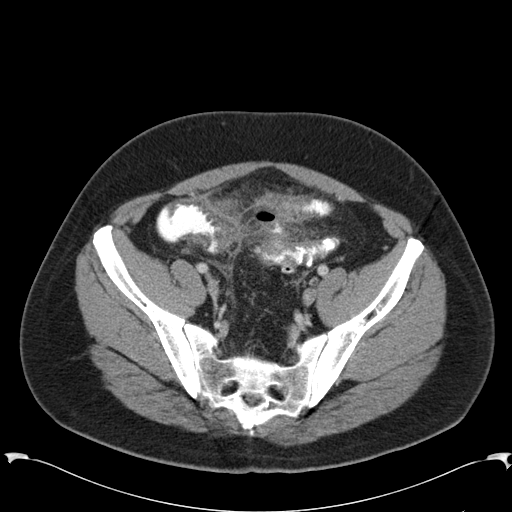
[im 42/100  soft-tissue]
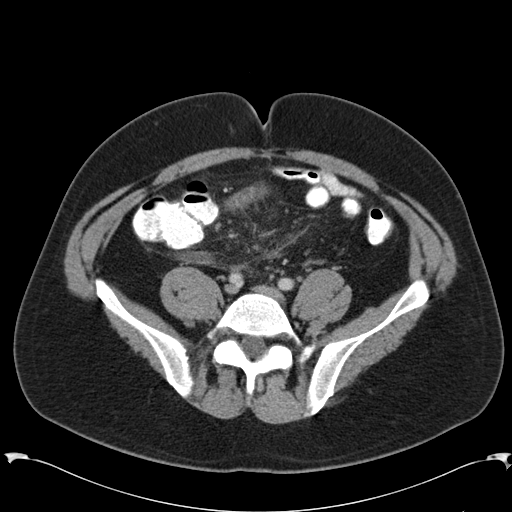
[im 47/100  soft-tissue]
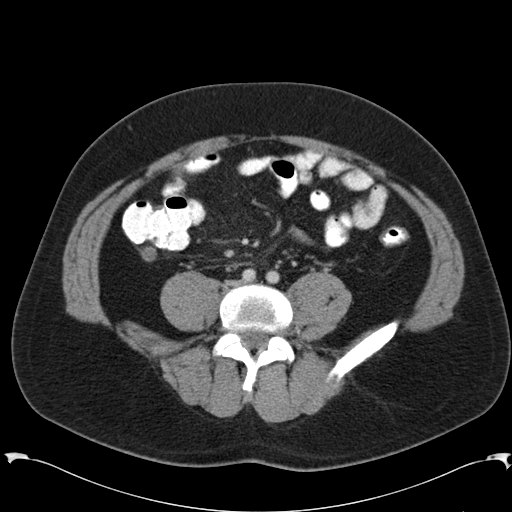
[im 53/100  soft-tissue]
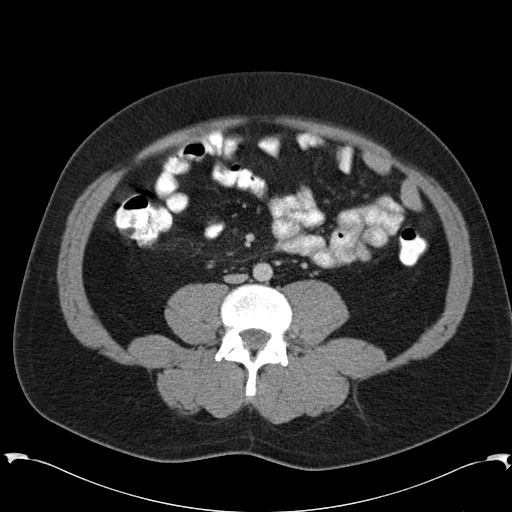
[im 58/100  soft-tissue]
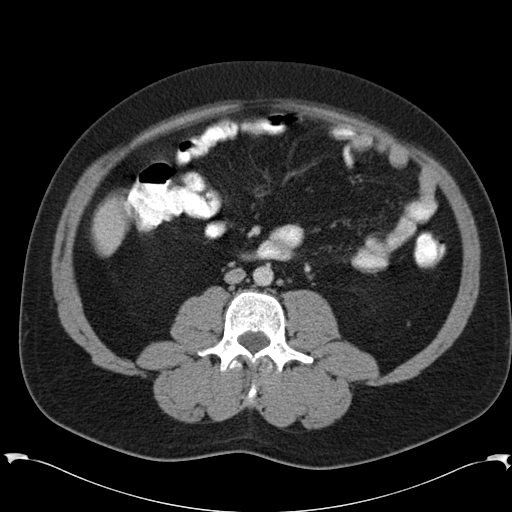
[im 58/100  bone]
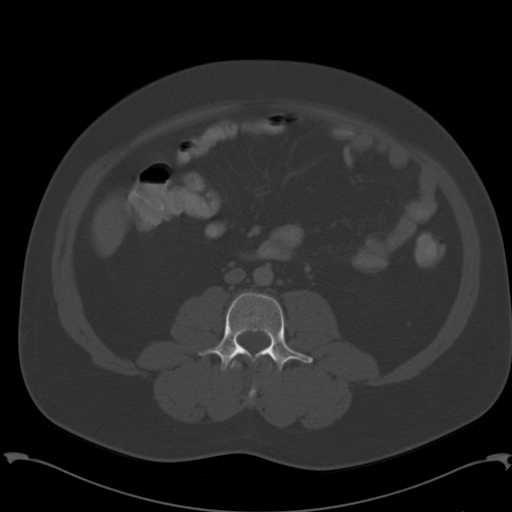
[im 68/100  soft-tissue]
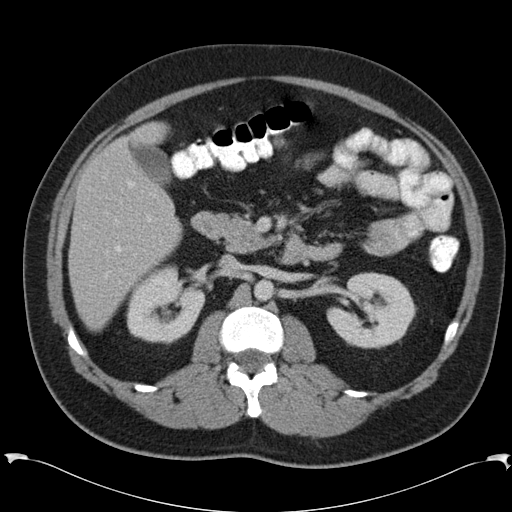
[im 73/100  soft-tissue]
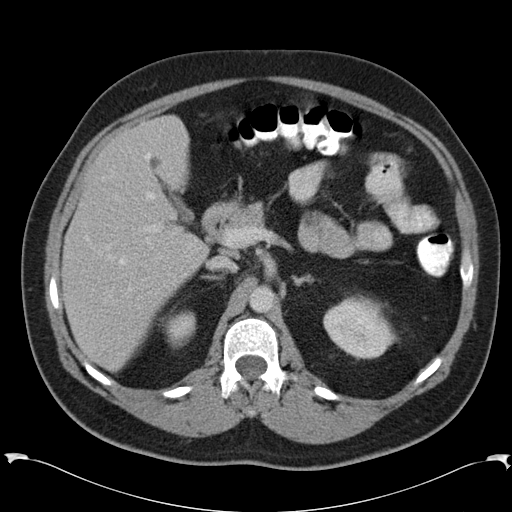
[im 79/100  soft-tissue]
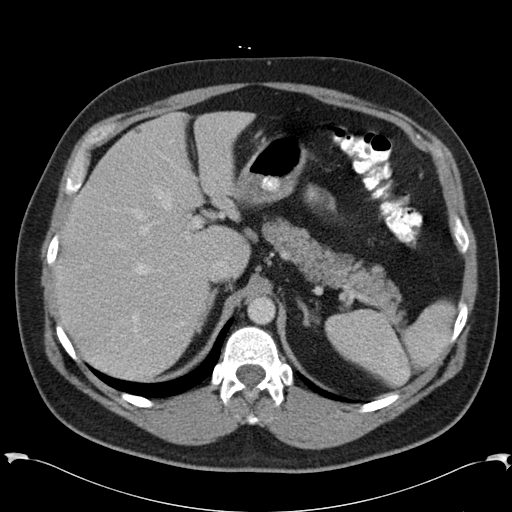
[im 89/100  soft-tissue]
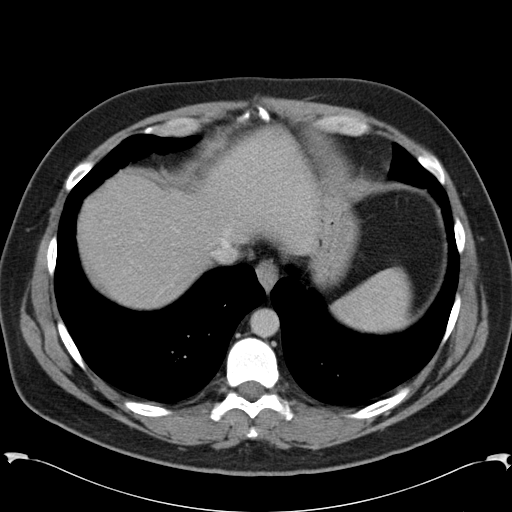
[im 94/100  soft-tissue]
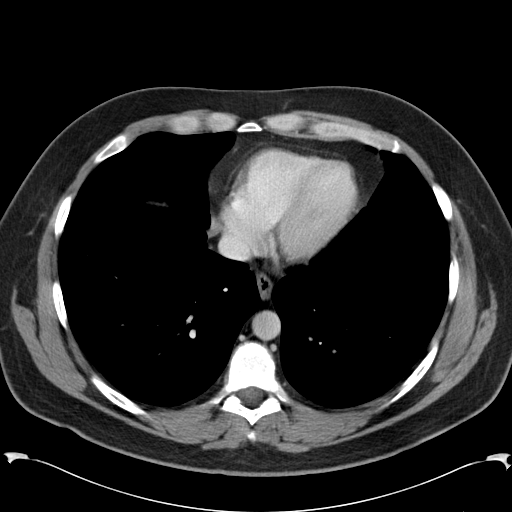

[Series 602: cor · coronal · 1.00mm/px · 3 of 142 slices shown]
[im 48/142  soft-tissue]
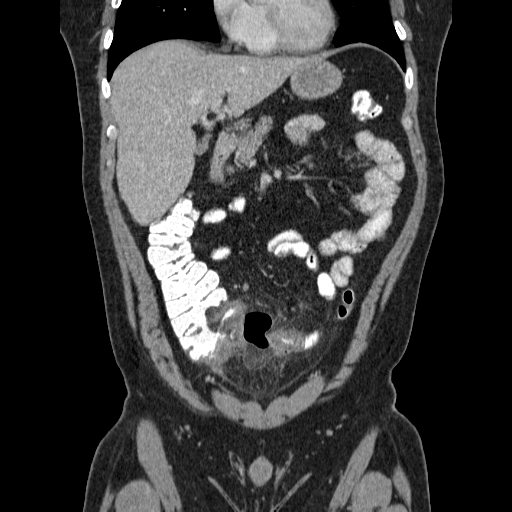
[im 63/142  soft-tissue]
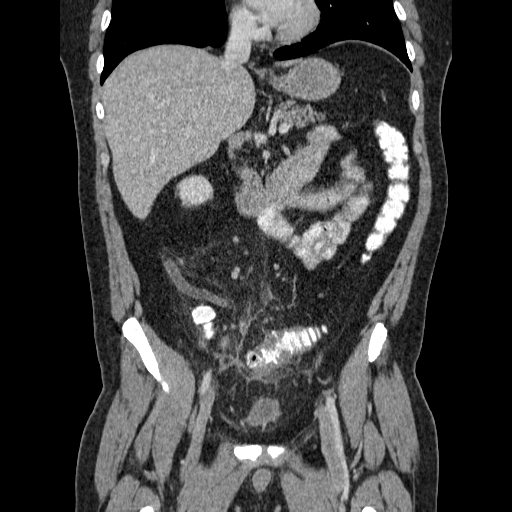
[im 79/142  soft-tissue]
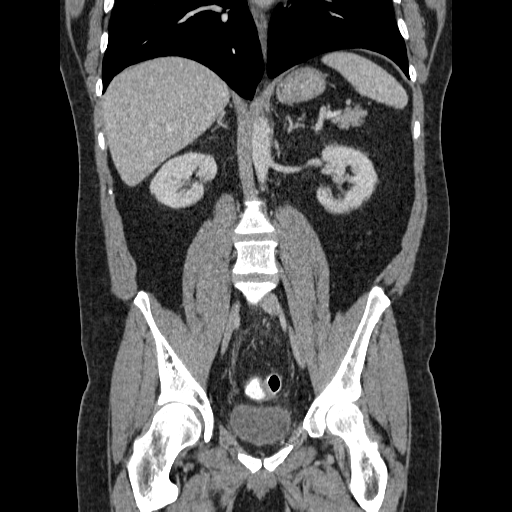

[17 of 46 positions shown; findings below may reference images not displayed]

FINDINGS: BODY WALL: Unremarkable.

LOWER CHEST: Unremarkable.

ABDOMEN/PELVIS:

Liver: Sub cm low-density liver likely reflects cysts.

Biliary: No evidence of biliary obstruction or stone.

Pancreas: Unremarkable.

Spleen: Unremarkable.

Adrenals: Unremarkable.

Kidneys and ureters: No hydronephrosis or stone.

Bladder: Unremarkable.

Reproductive: Unremarkable.

Bowel: Bowel perforation in the low mid abdomen with 4 cm gas and
fluid containing abscess that also contains oral contrast. A tract
of oral contrast extends towards the sigmoid colon, favoring a
diverticular perforation. The appendix base is contiguous with the
abscess and is overdistended and avidly enhancing. While the
appendix is likely obstructed, the inflammatory changes around the
tip and mid portion are not as extensive as expected for primary
appendicitis. There is terminal ileitis with circumferential
submucosal edema, likely secondary. Bowel encompasses most of the
abscess circumference, making percutaneous drainage difficult.

Retroperitoneum: No mass or adenopathy.

Peritoneum: No ascites or pneumoperitoneum.

Vascular: No acute abnormality.

OSSEOUS: No acute abnormalities.

These results were called by telephone at the time of interpretation
on 10/13/2014 at [DATE] to Dr. STELLA ROMO , who verbally
acknowledged these results.
IMPRESSION: Contained bowel perforation in the low abdomen with 4 cm abscess. A
diverticular source is favored over ruptured appendix, as reasoned
above.

## 2016-08-10 ENCOUNTER — Encounter: Payer: Self-pay | Admitting: Diagnostic Neuroimaging

## 2016-08-10 ENCOUNTER — Ambulatory Visit (INDEPENDENT_AMBULATORY_CARE_PROVIDER_SITE_OTHER): Payer: Medicare Other | Admitting: Diagnostic Neuroimaging

## 2016-08-10 VITALS — BP 126/86 | HR 81 | Wt 218.2 lb

## 2016-08-10 DIAGNOSIS — G809 Cerebral palsy, unspecified: Secondary | ICD-10-CM | POA: Diagnosis not present

## 2016-08-10 DIAGNOSIS — G40219 Localization-related (focal) (partial) symptomatic epilepsy and epileptic syndromes with complex partial seizures, intractable, without status epilepticus: Secondary | ICD-10-CM | POA: Diagnosis not present

## 2016-08-10 DIAGNOSIS — R6250 Unspecified lack of expected normal physiological development in childhood: Secondary | ICD-10-CM

## 2016-08-10 MED ORDER — CARBAMAZEPINE 200 MG PO TABS: 200.0000 mg | ORAL_TABLET | Freq: Three times a day (TID) | ORAL | 4 refills | Status: DC

## 2016-08-10 NOTE — Progress Notes (Signed)
[facility]  PATIENT: Edward Adkins DOB: [birth year]  REFERRING CLINICIAN:  HISTORY FROM: patient and father  REASON FOR VISIT: follow up visit   HISTORICAL  CHIEF COMPLAINT:  Chief Complaint  Patient presents with  . Partial symptomatic epilepsy    rm 8, father- Doren Custard, "no seizure activity"  . Follow-up    one year    HISTORY OF PRESENT ILLNESS:   UPDATE 08/10/16 ([facility]): Since last visit, doing well. No sz. Last seizure was Sept 1991. Continues to work at [employer]. He takes CBZ 200mg  TID (5am, 6pm, 11pm).   PRIOR HPI (CM): Patient has history of cerebral palsy, mild mental retardation and seizure disorder. He has been followed at [facility] since 1984, onset of seizure disorder at age 28. 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 He was originally on phenobarbital and developed a rash, switched to Dilantin with recurrent seizures. Initiated on carbamazepine in 1991without further seizure activity since that time. Denies any side effects to his medications, any daytime drowsiness, feelings of being off balance, no missed doses of his medication, and no falls.   REVIEW OF SYSTEMS: Full 14 system review of systems performed and negative with exception of: nothing.   ALLERGIES: Allergies  Allergen Reactions  . Phenobarbital     REACTION: rash    HOME MEDICATIONS: Outpatient Medications Prior to Visit  Medication Sig Dispense Refill  . carbamazepine (TEGRETOL) 200 MG tablet Take 1 tablet (200 mg total) by mouth 3 (three) times daily. 90 tablet 11  . Multiple Vitamin (MULTIVITAMIN) tablet Take 2 tablets by mouth daily.      No facility-administered medications prior to visit.     PAST MEDICAL HISTORY: Past Medical History:  Diagnosis Date  . Cerebral palsy ([place])   . Complex partial seizure disorder ([place]) 05/78   Head CT's multiple-WNL, EEG R temporal  lobe discharge ([facility])  . Diverticulitis Q000111Q   complicated by -itis, intra abd abscess, and perforation  . Diverticulitis of intestine with perforation 10/14/2014  . Diverticulosis Q000111Q   complicated by -itis, intra abd abscess, and perforation  . Intra-abdominal abscess ([place])   . Mild developmental delay     PAST SURGICAL HISTORY: No past surgical history on file.  FAMILY HISTORY: Family History  Problem Relation Age of Onset  . Cancer Mother 24    Leukemia  . Cancer Father 59    Prostate ca  . CAD Father   . Diabetes Father     SOCIAL HISTORY:  Social History   Social History  . Marital status: Single    Spouse name: N/A  . Number of children: 0  . Years of education: 12   Occupational History  .  [employer]   Social History Main Topics  . Smoking status: Never Smoker  . Smokeless tobacco: Never Used  . Alcohol use No  . Drug use: No  . Sexual activity: Not on file   Other Topics Concern  . Not on file   Social History Narrative   Patient is single and lives with his father.   Patient drinks two caffeine drinks daily.   Patient is ambi-dextrous.   Patient has a high school education.   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              PHYSICAL EXAM  GENERAL EXAM/CONSTITUTIONAL: Vitals:  Vitals:   08/10/16 1605  BP: 126/86  Pulse: 81  Weight: 218 lb 3.2 oz (99 kg)     Body mass index is 30.01 kg/m.  No exam data present  Patient is in no distress; well developed, nourished and groomed; neck is supple  CARDIOVASCULAR:  Examination of carotid arteries is normal; no carotid bruits  Regular rate and rhythm, no murmurs  Examination of peripheral vascular system by observation and palpation is normal  EYES:  Ophthalmoscopic exam of optic discs and posterior segments is normal; no papilledema or hemorrhages  MUSCULOSKELETAL:  Gait, strength, tone, movements noted in Neurologic exam below  NEUROLOGIC: MENTAL STATUS:  No  flowsheet data found.  awake, alert, oriented to person, place and time  recent and remote memory intact  normal attention and concentration  language fluent, comprehension intact, naming intact,   fund of knowledge appropriate  CRANIAL NERVE:   2nd - no papilledema on fundoscopic exam  2nd, 3rd, 4th, 6th - pupils equal and reactive to light, visual fields full to confrontation, extraocular muscles intact, no nystagmus  5th - facial sensation symmetric  7th - facial strength symmetric  8th - hearing intact  9th - palate elevates symmetrically, uvula midline  11th - shoulder shrug symmetric  12th - tongue protrusion midline  MOTOR:   normal bulk and tone, full strength in the BUE, BLE  SENSORY:   normal and symmetric to light touch, temperature  COORDINATION:   finger-nose-finger, fine finger movements normal  REFLEXES:   deep tendon reflexes TRACE and symmetric  GAIT/STATION:   narrow based gait; DECR ARM SWING    DIAGNOSTIC DATA (LABS, IMAGING, TESTING) - I reviewed patient records, labs, notes, testing and imaging myself where available.  Lab Results  Component Value Date   WBC 7.6 08/09/2015   HGB 12.8 (L) 10/20/2014   HCT 44.3 08/09/2015   MCV 91 08/09/2015   PLT 240 08/09/2015      Component Value Date/Time   NA 136 03/08/2016 1349   NA 143 08/09/2015 1451   K 4.0 03/08/2016 1349   CL 102 03/08/2016 1349   CO2 27 03/08/2016 1349   GLUCOSE 92 03/08/2016 1349   BUN 16 03/08/2016 1349   BUN 13 08/09/2015 1451   CREATININE 0.92 03/08/2016 1349   CALCIUM 9.6 03/08/2016 1349   PROT 7.6 08/09/2015 1451   ALBUMIN 4.6 08/09/2015 1451   AST 16 08/09/2015 1451   ALT 16 08/09/2015 1451   ALKPHOS 64 08/09/2015 1451   BILITOT <0.2 08/09/2015 1451   GFRNONAA 113 08/09/2015 1451   GFRAA 130 08/09/2015 1451   Lab Results  Component Value Date   CHOL 202 (H) 03/08/2016   HDL 37.00 (L) 03/08/2016   LDLCALC 126 (H) 03/08/2016   TRIG 191.0 (H)  03/08/2016   CHOLHDL 5 03/08/2016   No results found for: HGBA1C No results found for: VITAMINB12 Lab Results  Component Value Date   TSH 4.33 12/01/2010       ASSESSMENT AND PLAN  45 y.o. year old male here with cerebral palsy, mild mental retardation and seizure disorder. Doing well on CBZ 200mg  TID. Last seizure in 1991.   Dx:  1. Partial symptomatic epilepsy with complex partial seizures, intractable, without status epilepticus ([place])   2. Cerebral palsy, unspecified type ([place])   3. Mild developmental delay      PLAN: I spent 15 minutes of face to face time with patient. Greater than 50% of time was spent in counseling and coordination of care with patient. In  summary we discussed:  - continue CBZ 200mg  TID - annual labs per PCP (CBC, CMP) - follow up here annually; or may follow up as needed here if if PCP agrees to maintain refills of CBZ  Meds ordered this encounter  Medications  . carbamazepine (TEGRETOL) 200 MG tablet    Sig: Take 1 tablet (200 mg total) by mouth 3 (three) times daily.    Dispense:  270 tablet    Refill:  4   Return in about 1 year (around 08/10/2017) for with Cecille Rubin, NP.    Penni Bombard, MD [id number], [id number] PM Certified in Neurology, Neurophysiology and Neuroimaging  [facility] [address] [place], [place] [address] [phone]

## 2017-02-13 ENCOUNTER — Telehealth: Payer: Self-pay | Admitting: Family Medicine

## 2017-02-13 NOTE — Telephone Encounter (Signed)
Pt dropped off form to be excused from jury duty. Placed in [place] tower. Can reach him at [phone].

## 2017-02-14 NOTE — Telephone Encounter (Signed)
Letter written and in CMA box.  

## 2017-02-15 NOTE — Telephone Encounter (Signed)
Lm on pts vm and informed him letter is available for pickup from the front desk 

## 2017-03-10 ENCOUNTER — Other Ambulatory Visit: Payer: Self-pay | Admitting: Family Medicine

## 2017-03-10 DIAGNOSIS — Z5181 Encounter for therapeutic drug level monitoring: Secondary | ICD-10-CM

## 2017-03-10 DIAGNOSIS — G40219 Localization-related (focal) (partial) symptomatic epilepsy and epileptic syndromes with complex partial seizures, intractable, without status epilepticus: Secondary | ICD-10-CM

## 2017-03-10 DIAGNOSIS — E785 Hyperlipidemia, unspecified: Secondary | ICD-10-CM

## 2017-03-13 ENCOUNTER — Other Ambulatory Visit (INDEPENDENT_AMBULATORY_CARE_PROVIDER_SITE_OTHER): Payer: Medicare Other

## 2017-03-13 DIAGNOSIS — G40219 Localization-related (focal) (partial) symptomatic epilepsy and epileptic syndromes with complex partial seizures, intractable, without status epilepticus: Secondary | ICD-10-CM | POA: Diagnosis not present

## 2017-03-13 DIAGNOSIS — E785 Hyperlipidemia, unspecified: Secondary | ICD-10-CM | POA: Diagnosis not present

## 2017-03-13 LAB — CBC WITH DIFFERENTIAL/PLATELET
BASOS ABS: 0 10*3/uL (ref 0.0–0.1)
Basophils Relative: 0.7 % (ref 0.0–3.0)
EOS ABS: 0.2 10*3/uL (ref 0.0–0.7)
Eosinophils Relative: 3 % (ref 0.0–5.0)
HCT: 43.1 % (ref 39.0–52.0)
HEMOGLOBIN: 14.7 g/dL (ref 13.0–17.0)
LYMPHS ABS: 2.1 10*3/uL (ref 0.7–4.0)
Lymphocytes Relative: 33.8 % (ref 12.0–46.0)
MCHC: 34.1 g/dL (ref 30.0–36.0)
MCV: 90.9 fl (ref 78.0–100.0)
MONO ABS: 0.4 10*3/uL (ref 0.1–1.0)
Monocytes Relative: 7 % (ref 3.0–12.0)
NEUTROS PCT: 55.5 % (ref 43.0–77.0)
Neutro Abs: 3.5 10*3/uL (ref 1.4–7.7)
Platelets: 206 10*3/uL (ref 150.0–400.0)
RBC: 4.74 Mil/uL (ref 4.22–5.81)
RDW: 12.4 % (ref 11.5–15.5)
WBC: 6.3 10*3/uL (ref 4.0–10.5)

## 2017-03-13 LAB — COMPREHENSIVE METABOLIC PANEL
ALK PHOS: 55 U/L (ref 39–117)
ALT: 21 U/L (ref 0–53)
AST: 17 U/L (ref 0–37)
Albumin: 4.6 g/dL (ref 3.5–5.2)
BILIRUBIN TOTAL: 0.5 mg/dL (ref 0.2–1.2)
BUN: 14 mg/dL (ref 6–23)
CO2: 24 mEq/L (ref 19–32)
CREATININE: 0.89 mg/dL (ref 0.40–1.50)
Calcium: 10.1 mg/dL (ref 8.4–10.5)
Chloride: 104 mEq/L (ref 96–112)
GFR: 98.51 mL/min (ref >60.00)
Glucose, Bld: 95 mg/dL (ref 70–99)
Potassium: 4 mEq/L (ref 3.5–5.1)
SODIUM: 139 meq/L (ref 135–145)
Total Protein: 7.9 g/dL (ref 6.0–8.3)

## 2017-03-13 LAB — LIPID PANEL
CHOL/HDL RATIO: 5
Cholesterol: 219 mg/dL — ABNORMAL HIGH (ref 0–200)
HDL: 41.1 mg/dL (ref >39.00)
LDL Cholesterol: 142 mg/dL — ABNORMAL HIGH (ref 0–99)
NONHDL: 178.09
Triglycerides: 179 mg/dL — ABNORMAL HIGH (ref 0.0–149.0)
VLDL: 35.8 mg/dL (ref 0.0–40.0)

## 2017-03-13 LAB — TSH: TSH: 6.64 u[IU]/mL — AB (ref 0.35–4.50)

## 2017-03-15 ENCOUNTER — Other Ambulatory Visit (INDEPENDENT_AMBULATORY_CARE_PROVIDER_SITE_OTHER): Payer: Medicare Other

## 2017-03-15 DIAGNOSIS — R946 Abnormal results of thyroid function studies: Secondary | ICD-10-CM | POA: Diagnosis not present

## 2017-03-15 LAB — T4, FREE: FREE T4: 0.66 ng/dL (ref 0.60–1.60)

## 2017-03-19 ENCOUNTER — Encounter: Payer: Self-pay | Admitting: Family Medicine

## 2017-03-19 ENCOUNTER — Ambulatory Visit (INDEPENDENT_AMBULATORY_CARE_PROVIDER_SITE_OTHER): Payer: Medicare Other | Admitting: Family Medicine

## 2017-03-19 VITALS — BP 116/74 | HR 71 | Temp 98.3°F | Ht 71.0 in | Wt 209.5 lb

## 2017-03-19 DIAGNOSIS — E663 Overweight: Secondary | ICD-10-CM

## 2017-03-19 DIAGNOSIS — E785 Hyperlipidemia, unspecified: Secondary | ICD-10-CM

## 2017-03-19 DIAGNOSIS — Z0000 Encounter for general adult medical examination without abnormal findings: Secondary | ICD-10-CM

## 2017-03-19 DIAGNOSIS — Z7189 Other specified counseling: Secondary | ICD-10-CM

## 2017-03-19 DIAGNOSIS — G40309 Generalized idiopathic epilepsy and epileptic syndromes, not intractable, without status epilepticus: Secondary | ICD-10-CM | POA: Diagnosis not present

## 2017-03-19 DIAGNOSIS — K5730 Diverticulosis of large intestine without perforation or abscess without bleeding: Secondary | ICD-10-CM | POA: Diagnosis not present

## 2017-03-19 DIAGNOSIS — R6250 Unspecified lack of expected normal physiological development in childhood: Secondary | ICD-10-CM | POA: Diagnosis not present

## 2017-03-19 NOTE — Assessment & Plan Note (Signed)
Stable period. Continues working at [employer].

## 2017-03-19 NOTE — Assessment & Plan Note (Signed)
Encouraged healthy diet choices to help control lipid levels.  The 10-year ASCVD risk score [name] [name]., et al., 2013) is: 2.3%   Values used to calculate the score:     Age: 46 years     Sex: Male     Is Non-Hispanic African American: No     Diabetic: No     Tobacco smoker: No     Systolic Blood Pressure: 811 mmHg     Is BP treated: No     HDL Cholesterol: 41.1 mg/dL     Total Cholesterol: 219 mg/dL

## 2017-03-19 NOTE — Assessment & Plan Note (Signed)
Appreciate neuro care of patient.  

## 2017-03-19 NOTE — Progress Notes (Signed)
BP 116/74   Pulse 71   Temp 98.3 F (36.8 C) (Oral)   Ht 5\' 11"  (1.803 m)   Wt 209 lb 8 oz (95 kg)   SpO2 98%   BMI 29.22 kg/m    CC: medicare wellness visit  Subjective:    Patient ID: Edward Adkins, male    DOB: Jul 16, 1971, 46 y.o.   MRN: [id number]  HPI: NTHONY Adkins is a 46 y.o. male presenting on 03/19/2017 for Annual Exam (Medicare)   Noticing increased allergies that started last week.   Sees neuro Dr Leta Baptist once yearly. Has cerebral palsy and mild developmental delay.  Denies hypothyroid symptoms - no cold intolerance, no constipation, unexpected weight gain, or hair or skin changes.   9 lb weight loss - cutting back on portion sizes.    Visual Acuity Screening   Right eye Left eye Both eyes  Without correction: 20/25 20/25 20/25   With correction:     No falls in past year Depression screen - passed  Preventative: Colon cancer screening - pt states had colonoscopy through [facility] - will request records.  Prostate cancer screening - not due yet. +fmhx Lung cancer screening - not indicated Flu shot yearly at [facility] Tetanus shot - unsure Pneumonia shot - not due yet Shingles shot - not due yet Advanced directive discussion - would want father to be [facility] proxy.  Seat belt use discussed Sunscreen use discussed, no changing moles on skin Non smoker Alcohol - none  Patient is single and lives with his father.  Patient drinks two caffeine drinks daily.  Patient is ambi-dextrous.  Patient has a high school education.  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  Activity: plays golf, mows yard.  Diet: drinking more water.   Relevant past medical, surgical, family and social history reviewed and updated as indicated. Interim medical history since our last visit reviewed. Allergies and medications reviewed and updated. Outpatient Medications Prior to Visit  Medication Sig Dispense Refill  . carbamazepine (TEGRETOL) 200 MG tablet Take 1 tablet (200 mg total) by  mouth 3 (three) times daily. 270 tablet 4  . Multiple Vitamin (MULTIVITAMIN) tablet Take 2 tablets by mouth daily.      No facility-administered medications prior to visit.      Per HPI unless specifically indicated in ROS section below Review of Systems     Objective:    BP 116/74   Pulse 71   Temp 98.3 F (36.8 C) (Oral)   Ht 5\' 11"  (1.803 m)   Wt 209 lb 8 oz (95 kg)   SpO2 98%   BMI 29.22 kg/m   Wt Readings from Last 3 Encounters:  03/19/17 209 lb 8 oz (95 kg)  08/10/16 218 lb 3.2 oz (99 kg)  03/13/16 214 lb 8 oz (97.3 kg)    Physical Exam  Constitutional: He is oriented to person, place, and time. He appears well-developed and well-nourished. No distress.  HENT:  Head: Normocephalic and atraumatic.  Right Ear: Hearing, tympanic membrane, external ear and ear canal normal.  Left Ear: Hearing, tympanic membrane, external ear and ear canal normal.  Nose: Nose normal.  Mouth/Throat: Uvula is midline, oropharynx is clear and moist and mucous membranes are normal. No oropharyngeal exudate, posterior oropharyngeal edema or posterior oropharyngeal erythema.  Eyes: Pupils are equal, round, and reactive to light. Conjunctivae and EOM are normal. No scleral icterus.  Neck: Normal range of motion. Neck supple. No thyromegaly present.  Cardiovascular: Normal rate,  regular rhythm, normal heart sounds and intact distal pulses.   No murmur heard. Pulses:      Radial pulses are 2+ on the right side, and 2+ on the left side.  Pulmonary/Chest: Effort normal and breath sounds normal. No respiratory distress. He has no wheezes. He has no rales.  Abdominal: Soft. Bowel sounds are normal. He exhibits no distension and no mass. There is no tenderness. There is no rebound and no guarding.  Musculoskeletal: Normal range of motion. He exhibits no edema.  Lymphadenopathy:    He has no cervical adenopathy.  Neurological: He is alert and oriented to person, place, and time.  CN grossly intact,  station and gait intact Recall 3/3 Calculation 5/5 serial 3s  Skin: Skin is warm and dry. No rash noted.  Psychiatric: He has a normal mood and affect. His behavior is normal. Judgment and thought content normal.  Nursing note and vitals reviewed.  Results for orders placed or performed in visit on 03/15/17  T4, free  Result Value Ref Range   Free T4 0.66 0.60 - 1.60 ng/dL      Assessment & Plan:   Problem List Items Addressed This Visit    Advanced care planning/counseling discussion    Advanced directive discussion - would want father to be [facility] proxy.       Diverticulosis    Discussed need to be seen if prolonged lower abd pain with fever lasting >2d      Dyslipidemia    Encouraged healthy diet choices to help control lipid levels.  The 10-year ASCVD risk score Edward Adkins [name]., et al., 2013) is: 2.3%   Values used to calculate the score:     Age: 23 years     Sex: Male     Is Non-Hispanic African American: No     Diabetic: No     Tobacco smoker: No     Systolic Blood Pressure: 122 mmHg     Is BP treated: No     HDL Cholesterol: 41.1 mg/dL     Total Cholesterol: 219 mg/dL       Generalized convulsive epilepsy [facility])    Appreciate neuro care of patient.      Medicare annual wellness visit, subsequent - Primary    I have personally reviewed the Medicare Annual Wellness questionnaire and have noted 1. The patient's medical and social history 2. Their use of alcohol, tobacco or illicit drugs 3. Their current medications and supplements 4. The patient's functional ability including ADL's, fall risks, home safety risks and hearing or visual impairment. Cognitive function has been assessed and addressed as indicated.  5. Diet and physical activity 6. Evidence for depression or mood disorders The patients weight, height, BMI have been recorded in the chart. I have made referrals, counseling and provided education to the patient based on review of the above and I have provided  the pt with a written personalized care plan for preventive services. Provider list updated.. See scanned questionairre as needed for further documentation. Reviewed preventative protocols and updated unless pt declined.       Mild developmental delay    Stable period. Continues working at [employer].       Overweight    Encouraged healthy diet choices          Follow up plan: Return in about 1 year (around 03/19/2018) for medicare wellness visit.  [name], MD

## 2017-03-19 NOTE — Assessment & Plan Note (Signed)
Encouraged healthy diet choices.  

## 2017-03-19 NOTE — Patient Instructions (Addendum)
Sign release of information form for colonoscopy from [facility] 2016.  You are doing well today.  Return as needed or in 1 year for next medicare wellness visit Return in 6 months for labs only to recheck thyroid and cholesterol.  Health Maintenance, Male A healthy lifestyle and preventive care is important for your health and wellness. Ask your health care provider about what schedule of regular examinations is right for you. What should I know about weight and diet? Eat a Healthy Diet  Eat plenty of vegetables, fruits, whole grains, low-fat dairy products, and lean protein.  Do not eat a lot of foods high in solid fats, added sugars, or salt.  Maintain a Healthy Weight Regular exercise can help you achieve or maintain a healthy weight. You should:  Do at least 150 minutes of exercise each week. The exercise should increase your heart rate and make you sweat (moderate-intensity exercise).  Do strength-training exercises at least twice a week.  Watch Your Levels of Cholesterol and Blood Lipids  Have your blood tested for lipids and cholesterol every 5 years starting at 44 years of age. If you are at high risk for heart disease, you should start having your blood tested when you are 44 years old. You may need to have your cholesterol levels checked more often if: ? Your lipid or cholesterol levels are high. ? You are older than 44 years of age. ? You are at high risk for heart disease.  What should I know about cancer screening? Many types of cancers can be detected early and may often be prevented. Lung Cancer  You should be screened every year for lung cancer if: ? You are a current smoker who has smoked for at least 30 years. ? You are a former smoker who has quit within the past 15 years.  Talk to your health care provider about your screening options, when you should start screening, and how often you should be screened.  Colorectal Cancer  Routine colorectal cancer screening  usually begins at 44 years of age and should be repeated every 5-10 years until you are 44 years old. You may need to be screened more often if early forms of precancerous polyps or small growths are found. Your health care provider may recommend screening at an earlier age if you have risk factors for colon cancer.  Your health care provider may recommend using home test kits to check for hidden blood in the stool.  A small camera at the end of a tube can be used to examine your colon (sigmoidoscopy or colonoscopy). This checks for the earliest forms of colorectal cancer.  Prostate and Testicular Cancer  Depending on your age and overall health, your health care provider may do certain tests to screen for prostate and testicular cancer.  Talk to your health care provider about any symptoms or concerns you have about testicular or prostate cancer.  Skin Cancer  Check your skin from head to toe regularly.  Tell your health care provider about any new moles or changes in moles, especially if: ? There is a change in a mole's size, shape, or color. ? You have a mole that is larger than a pencil eraser.  Always use sunscreen. Apply sunscreen liberally and repeat throughout the day.  Protect yourself by wearing long sleeves, pants, a wide-brimmed hat, and sunglasses when outside.  What should I know about heart disease, diabetes, and high blood pressure?  If you are 65-93 years of age, have  your blood pressure checked every 3-5 years. If you are 71 years of age or older, have your blood pressure checked every year. You should have your blood pressure measured twice-once when you are at a hospital or clinic, and once when you are not at a hospital or clinic. Record the average of the two measurements. To check your blood pressure when you are not at a hospital or clinic, you can use: ? An automated blood pressure machine at a pharmacy. ? A home blood pressure monitor.  Talk to your health care  provider about your target blood pressure.  If you are between 18-50 years old, ask your health care provider if you should take aspirin to prevent heart disease.  Have regular diabetes screenings by checking your fasting blood sugar level. ? If you are at a normal weight and have a low risk for diabetes, have this test once every three years after the age of 3. ? If you are overweight and have a high risk for diabetes, consider being tested at a younger age or more often.  A one-time screening for abdominal aortic aneurysm (AAA) by ultrasound is recommended for men aged 43-75 years who are current or former smokers. What should I know about preventing infection? Hepatitis B If you have a higher risk for hepatitis B, you should be screened for this virus. Talk with your health care provider to find out if you are at risk for hepatitis B infection. Hepatitis C Blood testing is recommended for:  Everyone born from [birth year] through 1963.  Anyone with known risk factors for hepatitis C.  Sexually Transmitted Diseases (STDs)  You should be screened each year for STDs including gonorrhea and chlamydia if: ? You are sexually active and are younger than 44 years of age. ? You are older than 44 years of age and your health care provider tells you that you are at risk for this type of infection. ? Your sexual activity has changed since you were last screened and you are at an increased risk for chlamydia or gonorrhea. Ask your health care provider if you are at risk.  Talk with your health care provider about whether you are at high risk of being infected with HIV. Your health care provider may recommend a prescription medicine to help prevent HIV infection.  What else can I do?  Schedule regular health, dental, and eye exams.  Stay current with your vaccines (immunizations).  Do not use any tobacco products, such as cigarettes, chewing tobacco, and e-cigarettes. If you need help quitting, ask  your health care provider.  Limit alcohol intake to no more than 2 drinks per day. One drink equals 12 ounces of beer, 5 ounces of wine, or 1 ounces of hard liquor.  Do not use street drugs.  Do not share needles.  Ask your health care provider for help if you need support or information about quitting drugs.  Tell your health care provider if you often feel depressed.  Tell your health care provider if you have ever been abused or do not feel safe at home. This information is not intended to replace advice given to you by your health care provider. Make sure you discuss any questions you have with your health care provider. Document Released: 02/17/2008 Document Revised: 04/19/2016 Document Reviewed: 05/25/2015 Elsevier Interactive Patient Education  [employer].

## 2017-03-19 NOTE — Assessment & Plan Note (Signed)

## 2017-03-19 NOTE — Assessment & Plan Note (Signed)
Discussed need to be seen if prolonged lower abd pain with fever lasting >2d

## 2017-03-19 NOTE — Assessment & Plan Note (Signed)
Advanced directive discussion - would want father to be [facility] proxy.

## 2017-04-17 ENCOUNTER — Telehealth: Payer: Self-pay | Admitting: Family Medicine

## 2017-04-17 NOTE — Telephone Encounter (Signed)
Pt called concerning a bill he received for cpe 03/19/17. He called [facility] and was told it was not coded properly. He would like a cb to discuss.

## 2017-07-02 DIAGNOSIS — Z23 Encounter for immunization: Secondary | ICD-10-CM | POA: Diagnosis not present

## 2017-08-13 ENCOUNTER — Telehealth: Payer: Self-pay | Admitting: *Deleted

## 2017-08-13 NOTE — Telephone Encounter (Signed)
Spoke to father and relayed that we are closed and cancelling appt.  He did verbalize understanding. He will ask pt to call us back to reschedule as pt was not available.

## 2017-08-14 ENCOUNTER — Ambulatory Visit: Payer: Medicare Other | Admitting: Nurse Practitioner

## 2017-08-20 ENCOUNTER — Other Ambulatory Visit: Payer: Self-pay | Admitting: Diagnostic Neuroimaging

## 2017-08-25 ENCOUNTER — Other Ambulatory Visit: Payer: Self-pay | Admitting: Diagnostic Neuroimaging

## 2017-09-16 ENCOUNTER — Other Ambulatory Visit: Payer: Self-pay | Admitting: Family Medicine

## 2017-09-16 DIAGNOSIS — E785 Hyperlipidemia, unspecified: Secondary | ICD-10-CM

## 2017-09-16 DIAGNOSIS — R7989 Other specified abnormal findings of blood chemistry: Secondary | ICD-10-CM

## 2017-09-16 DIAGNOSIS — E039 Hypothyroidism, unspecified: Secondary | ICD-10-CM

## 2017-09-16 DIAGNOSIS — E038 Other specified hypothyroidism: Secondary | ICD-10-CM

## 2017-09-17 ENCOUNTER — Other Ambulatory Visit (INDEPENDENT_AMBULATORY_CARE_PROVIDER_SITE_OTHER): Payer: Medicare Other

## 2017-09-17 ENCOUNTER — Other Ambulatory Visit: Payer: Self-pay

## 2017-09-17 DIAGNOSIS — R7989 Other specified abnormal findings of blood chemistry: Secondary | ICD-10-CM | POA: Diagnosis not present

## 2017-09-17 DIAGNOSIS — E039 Hypothyroidism, unspecified: Secondary | ICD-10-CM

## 2017-09-17 DIAGNOSIS — E785 Hyperlipidemia, unspecified: Secondary | ICD-10-CM

## 2017-09-17 DIAGNOSIS — E038 Other specified hypothyroidism: Secondary | ICD-10-CM

## 2017-09-17 LAB — LIPID PANEL
CHOL/HDL RATIO: 5
CHOLESTEROL: 187 mg/dL (ref 0–200)
HDL: 40.2 mg/dL (ref >39.00)
LDL CALC: 121 mg/dL — AB (ref 0–99)
NONHDL: 147.04
Triglycerides: 130 mg/dL (ref 0.0–149.0)
VLDL: 26 mg/dL (ref 0.0–40.0)

## 2017-09-17 LAB — T4, FREE: Free T4: 0.69 ng/dL (ref 0.60–1.60)

## 2017-09-17 LAB — TSH: TSH: 6.36 u[IU]/mL — ABNORMAL HIGH (ref 0.35–4.50)

## 2017-09-22 ENCOUNTER — Encounter: Payer: Self-pay | Admitting: Family Medicine

## 2017-09-22 DIAGNOSIS — E039 Hypothyroidism, unspecified: Secondary | ICD-10-CM | POA: Insufficient documentation

## 2017-10-17 ENCOUNTER — Encounter: Payer: Self-pay | Admitting: Nurse Practitioner

## 2017-10-30 ENCOUNTER — Ambulatory Visit: Payer: Self-pay | Admitting: Nurse Practitioner

## 2018-01-02 NOTE — Progress Notes (Signed)
[facility]  PATIENT: Edward Adkins DOB: 1971-04-11   REASON FOR VISIT: Follow-up for seizure disorder HISTORY FROM: Patient   HISTORY OF PRESENT ILLNESS: Update 5/2/2019CM Edward Adkins, 47 year old male returns for follow-up with history of seizure disorder complex partial with secondary generalization.  He is currently on carbamazepine 200 mg 1 tablet 3 times daily.  He denies any side effects to the medication.  He denies any falls or balance issues. His last seizure was in September 1991.  He continues to work at [employer].  He exercises by playing golf.  He has a history of cerebral palsy mild mental retardation as well he returns for reevaluation.  No new medical issues.  He returns for reevaluation  UPDATE 08/10/16 (VRP): Since last visit, doing well. No sz. Last seizure was Sept 1991. Continues to work at [employer]. He takes CBZ 200mg  TID (5am, 6pm, 11pm).   PRIOR HPI (CM): Patient has history of cerebral palsy, mild mental retardation and seizure disorder. He has been followed at [facility] since 1984, onset of seizure disorder at age 9. 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 He was originally on phenobarbital and developed a rash, switched to Dilantin with recurrent seizures. Initiated on carbamazepine in 1991without further seizure activity since that time. Denies any side effects to his medications, any daytime drowsiness, feelings of being off balance, no missed doses of his medication, and no falls.     REVIEW OF SYSTEMS: Full 14 system review of systems performed and notable only for those listed, all others are neg:  Constitutional: neg  Cardiovascular: neg Ear/Nose/Throat: neg  Skin: neg Eyes: neg Respiratory: neg Gastroitestinal: neg  Hematology/Lymphatic: neg  Endocrine: neg Musculoskeletal:neg Allergy/Immunology: neg Neurological:  neg Psychiatric: neg Sleep : neg   ALLERGIES: Allergies  Allergen Reactions  . Phenobarbital     REACTION: rash    HOME MEDICATIONS: Outpatient Medications Prior to Visit  Medication Sig Dispense Refill  . carbamazepine (TEGRETOL) 200 MG tablet take 1 tablet by mouth three times a day 270 tablet 4  . Multiple Vitamin (MULTIVITAMIN) tablet Take 2 tablets by mouth daily.      No facility-administered medications prior to visit.     PAST MEDICAL HISTORY: Past Medical History:  Diagnosis Date  . Cerebral palsy ([place])   . Complex partial seizure disorder ([place]) 05/78   Head CT's multiple-WNL, EEG R temporal lobe discharge ([facility])  . Diverticulitis 03/4258   complicated by -itis, intra abd abscess, and perforation  . Diverticulitis of intestine with perforation 10/14/2014  . Diverticulosis 01/6386   complicated by -itis, intra abd abscess, and perforation  . Intra-abdominal abscess ([place])   . Mild developmental delay     PAST SURGICAL HISTORY: History reviewed. No pertinent surgical history.  FAMILY HISTORY: Family History  Problem Relation Age of Onset  . Cancer Mother 15       Leukemia  . Cancer Father 28       Prostate ca  . CAD Father   . Diabetes Father     SOCIAL HISTORY: Social History   Socioeconomic History  . Marital status: Single    Spouse name: Not on file  . Number of children: 0  . Years of education: 68  . Highest education level: Not on file  Occupational History    Employer: [employer]  Social Needs  . Financial resource strain: Not on  file  . Food insecurity:    Worry: Not on file    Inability: Not on file  . Transportation needs:    Medical: Not on file    Non-medical: Not on file  Tobacco Use  . Smoking status: Never Smoker  . Smokeless tobacco: Never Used  Substance and Sexual Activity  . Alcohol use: No    Alcohol/week: 0.0 oz  . Drug use: No  . Sexual activity: Not on file  Lifestyle  . Physical activity:    Days per  week: Not on file    Minutes per session: Not on file  . Stress: Not on file  Relationships  . Social connections:    Talks on phone: Not on file    Gets together: Not on file    Attends religious service: Not on file    Active member of club or organization: Not on file    Attends meetings of clubs or organizations: Not on file    Relationship status: Not on file  . Intimate partner violence:    Fear of current or ex partner: Not on file    Emotionally abused: Not on file    Physically abused: Not on file    Forced sexual activity: Not on file  Other Topics Concern  . Not on file  Social History Narrative   Patient is single and lives with his father.   Patient drinks two caffeine drinks daily.   Patient is ambi-dextrous.   Patient has a high school education.   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              PHYSICAL EXAM  Vitals:   01/03/18 0918  BP: 117/78  Pulse: 65  Weight: 210 lb 6.4 oz (95.4 kg)  Height: 5\' 11"  (1.803 m)   Body mass index is 29.34 kg/m.  Generalized: Well developed, in no acute distress  Head: normocephalic and atraumatic,. Oropharynx benign  Neck: Supple,  Musculoskeletal: No deformity   Neurological examination   Mentation: Alert oriented to time, place, history taking. Attention span and concentration appropriate. Recent and remote memory intact.  Follows all commands speech and language fluent.   Cranial nerve II-XII: .Pupils were equal round reactive to light extraocular movements were full, visual field were full on confrontational test. Facial sensation and strength were normal. hearing was intact to finger rubbing bilaterally. Uvula tongue midline. head turning and shoulder shrug were normal and symmetric.Tongue protrusion into cheek strength was normal. Motor: normal bulk and tone, full strength in the BUE, BLE, fine finger movements normal, no pronator drift. No focal weakness Sensory: normal and symmetric to light touch,   Coordination: finger-nose-finger, heel-to-shin bilaterally, no dysmetria Reflexes: Symmetric upper and lower, plantar responses were flexor bilaterally. Gait and Station: Rising up from seated position without assistance, normal stance,  moderate stride, good arm swing, smooth turning, able to perform tiptoe, and heel walking without difficulty. Tandem gait is steady  DIAGNOSTIC DATA (LABS, IMAGING, TESTING) - I reviewed patient records, labs, notes, testing and imaging myself where available.  Lab Results  Component Value Date   WBC 6.3 03/13/2017   HGB 14.7 03/13/2017   HCT 43.1 03/13/2017   MCV 90.9 03/13/2017   PLT 206.0 03/13/2017      Component Value Date/Time   NA 139 03/13/2017 1432   NA 143 08/09/2015 1451   K 4.0 03/13/2017 1432   CL 104 03/13/2017 1432   CO2 24 03/13/2017 1432   GLUCOSE 95 03/13/2017  1432   BUN 14 03/13/2017 1432   BUN 13 08/09/2015 1451   CREATININE 0.89 03/13/2017 1432   CALCIUM 10.1 03/13/2017 1432   PROT 7.9 03/13/2017 1432   PROT 7.6 08/09/2015 1451   ALBUMIN 4.6 03/13/2017 1432   ALBUMIN 4.6 08/09/2015 1451   AST 17 03/13/2017 1432   ALT 21 03/13/2017 1432   ALKPHOS 55 03/13/2017 1432   BILITOT 0.5 03/13/2017 1432   BILITOT <0.2 08/09/2015 1451   GFRNONAA 113 08/09/2015 1451   GFRAA 130 08/09/2015 1451   Lab Results  Component Value Date   CHOL 187 09/17/2017   HDL 40.20 09/17/2017   LDLCALC 121 (H) 09/17/2017   TRIG 130.0 09/17/2017   CHOLHDL 5 09/17/2017    Lab Results  Component Value Date   TSH 6.36 (H) 09/17/2017      ASSESSMENT AND PLAN  47 y.o. year old male here with cerebral palsy, mild mental retardation and seizure disorder. Doing well on CBZ 200mg  TID. Last seizure in 1991.   PLAN Continue carbamazepine 200 mg 1 tablet 3 times a day just refilled till Dec 2019 We will check labs today, CBC and CMP to monitor adverse effects of carbamazepine Carbamazepine level to check for therapeutic versus toxicity Call  for any seizure activity Follow-up yearly and as needed Dennie Bible, [facility], [facility], [facility] [address], [place], [place] [address] [phone]

## 2018-01-03 ENCOUNTER — Encounter: Payer: Self-pay | Admitting: Nurse Practitioner

## 2018-01-03 ENCOUNTER — Ambulatory Visit (INDEPENDENT_AMBULATORY_CARE_PROVIDER_SITE_OTHER): Payer: Medicare Other | Admitting: Nurse Practitioner

## 2018-01-03 VITALS — BP 117/78 | HR 65 | Ht 71.0 in | Wt 210.4 lb

## 2018-01-03 DIAGNOSIS — G40219 Localization-related (focal) (partial) symptomatic epilepsy and epileptic syndromes with complex partial seizures, intractable, without status epilepticus: Secondary | ICD-10-CM

## 2018-01-03 DIAGNOSIS — G809 Cerebral palsy, unspecified: Secondary | ICD-10-CM | POA: Diagnosis not present

## 2018-01-03 DIAGNOSIS — Z5181 Encounter for therapeutic drug level monitoring: Secondary | ICD-10-CM

## 2018-01-03 DIAGNOSIS — G40309 Generalized idiopathic epilepsy and epileptic syndromes, not intractable, without status epilepticus: Secondary | ICD-10-CM | POA: Diagnosis not present

## 2018-01-03 NOTE — Patient Instructions (Signed)
Continue carbamazepine 200 mg 1 tablet 3 times a day We will check labs today Call for any seizure activity Follow-up yearly and as needed

## 2018-01-04 LAB — CBC WITH DIFFERENTIAL/PLATELET
BASOS ABS: 0 10*3/uL (ref 0.0–0.2)
Basos: 1 %
EOS (ABSOLUTE): 0.2 10*3/uL (ref 0.0–0.4)
Eos: 3 %
Hematocrit: 45.7 % (ref 37.5–51.0)
Hemoglobin: 15.3 g/dL (ref 13.0–17.7)
Immature Grans (Abs): 0 10*3/uL (ref 0.0–0.1)
Immature Granulocytes: 0 %
LYMPHS ABS: 1.8 10*3/uL (ref 0.7–3.1)
Lymphs: 29 %
MCH: 31.2 pg (ref 26.6–33.0)
MCHC: 33.5 g/dL (ref 31.5–35.7)
MCV: 93 fL (ref 79–97)
Monocytes Absolute: 0.5 10*3/uL (ref 0.1–0.9)
Monocytes: 8 %
NEUTROS ABS: 3.6 10*3/uL (ref 1.4–7.0)
Neutrophils: 59 %
Platelets: 206 10*3/uL (ref 150–379)
RBC: 4.91 x10E6/uL (ref 4.14–5.80)
RDW: 13.1 % (ref 12.3–15.4)
WBC: 6 10*3/uL (ref 3.4–10.8)

## 2018-01-04 LAB — COMPREHENSIVE METABOLIC PANEL
ALBUMIN: 4.7 g/dL (ref 3.5–5.5)
ALK PHOS: 63 IU/L (ref 39–117)
ALT: 21 IU/L (ref 0–44)
AST: 18 IU/L (ref 0–40)
Albumin/Globulin Ratio: 1.6 (ref 1.2–2.2)
BILIRUBIN TOTAL: 0.3 mg/dL (ref 0.0–1.2)
BUN/Creatinine Ratio: 16 (ref 9–20)
BUN: 13 mg/dL (ref 6–24)
CHLORIDE: 103 mmol/L (ref 96–106)
CO2: 24 mmol/L (ref 20–29)
Calcium: 10 mg/dL (ref 8.7–10.2)
Creatinine, Ser: 0.82 mg/dL (ref 0.76–1.27)
GFR calc Af Amer: 123 mL/min/{1.73_m2} (ref >59)
GFR calc non Af Amer: 107 mL/min/{1.73_m2} (ref >59)
GLOBULIN, TOTAL: 2.9 g/dL (ref 1.5–4.5)
GLUCOSE: 87 mg/dL (ref 65–99)
Potassium: 4.6 mmol/L (ref 3.5–5.2)
SODIUM: 141 mmol/L (ref 134–144)
TOTAL PROTEIN: 7.6 g/dL (ref 6.0–8.5)

## 2018-01-04 LAB — CARBAMAZEPINE LEVEL, TOTAL: CARBAMAZEPINE LVL: 7.9 ug/mL (ref 4.0–12.0)

## 2018-01-08 ENCOUNTER — Telehealth: Payer: Self-pay | Admitting: *Deleted

## 2018-01-08 NOTE — Telephone Encounter (Signed)
LVM informing patient his labs are within normal limits, and to continue taking medication as prescribed. Left number for any questions.

## 2018-01-14 NOTE — Progress Notes (Signed)
I reviewed note and agree with plan.   Hazem Kenner R. Jill Ruppe, MD  Certified in Neurology, Neurophysiology and Neuroimaging  [facility] [address], [address] [place], [place] [address] [phone]   

## 2018-03-17 ENCOUNTER — Other Ambulatory Visit: Payer: Self-pay | Admitting: Family Medicine

## 2018-03-17 DIAGNOSIS — R946 Abnormal results of thyroid function studies: Secondary | ICD-10-CM

## 2018-03-17 DIAGNOSIS — E039 Hypothyroidism, unspecified: Secondary | ICD-10-CM

## 2018-03-17 DIAGNOSIS — E785 Hyperlipidemia, unspecified: Secondary | ICD-10-CM

## 2018-03-18 ENCOUNTER — Other Ambulatory Visit (INDEPENDENT_AMBULATORY_CARE_PROVIDER_SITE_OTHER): Payer: Medicare Other

## 2018-03-18 DIAGNOSIS — R946 Abnormal results of thyroid function studies: Secondary | ICD-10-CM

## 2018-03-18 DIAGNOSIS — E039 Hypothyroidism, unspecified: Secondary | ICD-10-CM

## 2018-03-18 DIAGNOSIS — E785 Hyperlipidemia, unspecified: Secondary | ICD-10-CM | POA: Diagnosis not present

## 2018-03-18 LAB — LIPID PANEL
CHOL/HDL RATIO: 5
Cholesterol: 206 mg/dL — ABNORMAL HIGH (ref 0–200)
HDL: 37.6 mg/dL — ABNORMAL LOW (ref >39.00)
LDL Cholesterol: 139 mg/dL — ABNORMAL HIGH (ref 0–99)
NONHDL: 168.72
Triglycerides: 149 mg/dL (ref 0.0–149.0)
VLDL: 29.8 mg/dL (ref 0.0–40.0)

## 2018-03-18 LAB — TSH: TSH: 8.25 u[IU]/mL — AB (ref 0.35–4.50)

## 2018-03-18 LAB — T4, FREE: FREE T4: 0.63 ng/dL (ref 0.60–1.60)

## 2018-03-19 LAB — T3: T3 TOTAL: 76 ng/dL (ref 76–181)

## 2018-03-21 ENCOUNTER — Encounter: Payer: Self-pay | Admitting: Family Medicine

## 2018-03-21 ENCOUNTER — Ambulatory Visit (INDEPENDENT_AMBULATORY_CARE_PROVIDER_SITE_OTHER): Payer: Medicare Other | Admitting: Family Medicine

## 2018-03-21 VITALS — BP 118/80 | HR 73 | Temp 97.7°F | Ht 71.0 in | Wt 207.8 lb

## 2018-03-21 DIAGNOSIS — G40309 Generalized idiopathic epilepsy and epileptic syndromes, not intractable, without status epilepticus: Secondary | ICD-10-CM

## 2018-03-21 DIAGNOSIS — R6250 Unspecified lack of expected normal physiological development in childhood: Secondary | ICD-10-CM | POA: Diagnosis not present

## 2018-03-21 DIAGNOSIS — Z7189 Other specified counseling: Secondary | ICD-10-CM

## 2018-03-21 DIAGNOSIS — E039 Hypothyroidism, unspecified: Secondary | ICD-10-CM

## 2018-03-21 DIAGNOSIS — Z0000 Encounter for general adult medical examination without abnormal findings: Secondary | ICD-10-CM

## 2018-03-21 DIAGNOSIS — E785 Hyperlipidemia, unspecified: Secondary | ICD-10-CM | POA: Diagnosis not present

## 2018-03-21 DIAGNOSIS — G809 Cerebral palsy, unspecified: Secondary | ICD-10-CM | POA: Diagnosis not present

## 2018-03-21 DIAGNOSIS — R946 Abnormal results of thyroid function studies: Secondary | ICD-10-CM

## 2018-03-21 MED ORDER — LEVOTHYROXINE SODIUM 50 MCG PO TABS: 50.0000 ug | ORAL_TABLET | Freq: Every day | ORAL | 6 refills | Status: DC

## 2018-03-21 NOTE — Patient Instructions (Addendum)
Thyroid remains low - start levothyroxine 38mcg nightly at bedtime - try to take at least 1-2 hours after eating. Return in 3 months for lab visit only to recheck thyroid levels. Let us know sooner if not tolerated well. You are doing well today. Return in 6 months for follow up thyroid.   Hypothyroidism Hypothyroidism is a disorder of the thyroid. The thyroid is a large gland that is located in the lower front of the neck. The thyroid releases hormones that control how the body works. With hypothyroidism, the thyroid does not make enough of these hormones. What are the causes? Causes of hypothyroidism may include:  Viral infections.  Pregnancy.  Your own defense system (immune system) attacking your thyroid.  Certain medicines.  Birth defects.  Past radiation treatments to your head or neck.  Past treatment with radioactive iodine.  Past surgical removal of part or all of your thyroid.  Problems with the gland that is located in the center of your brain (pituitary).  What are the signs or symptoms? Signs and symptoms of hypothyroidism may include:  Feeling as though you have no energy (lethargy).  Inability to tolerate cold.  Weight gain that is not explained by a change in diet or exercise habits.  Dry skin.  Coarse hair.  Menstrual irregularity.  Slowing of thought processes.  Constipation.  Sadness or depression.  How is this diagnosed? Your health care provider may diagnose hypothyroidism with blood tests and ultrasound tests. How is this treated? Hypothyroidism is treated with medicine that replaces the hormones that your body does not make. After you begin treatment, it may take several weeks for symptoms to go away. Follow these instructions at home:  Take medicines only as directed by your health care provider.  If you start taking any new medicines, tell your health care provider.  Keep all follow-up visits as directed by your health care provider.  This is important. As your condition improves, your dosage needs may change. You will need to have blood tests regularly so that your health care provider can watch your condition. Contact a health care provider if:  Your symptoms do not get better with treatment.  You are taking thyroid replacement medicine and: ? You sweat excessively. ? You have tremors. ? You feel anxious. ? You lose weight rapidly. ? You cannot tolerate heat. ? You have emotional swings. ? You have diarrhea. ? You feel weak. Get help right away if:  You develop chest pain.  You develop an irregular heartbeat.  You develop a rapid heartbeat. This information is not intended to replace advice given to you by your health care provider. Make sure you discuss any questions you have with your health care provider. Document Released: 08/21/2005 Document Revised: 01/27/2016 Document Reviewed: 01/06/2014 Elsevier Interactive Patient Education  2018 [employer].

## 2018-03-21 NOTE — Assessment & Plan Note (Signed)
Progressive. Endorses some lethargy. Will trial 107mcg levothyroxine. He takes tegretol in the morning - I have asked him to take thyroid replacement at bedtime - 1-2 hours after dinner. RTC 3 mo lab visit only, RTC 6 mo f/u visit.

## 2018-03-21 NOTE — Assessment & Plan Note (Signed)
Advanced directive discussion - has not set up. Would want father to be [facility] proxy.

## 2018-03-21 NOTE — Assessment & Plan Note (Signed)
Mild off meds. Reviewed FLP. The 10-year ASCVD risk score [name] [name]., et al., 2013) is: 2.6%   Values used to calculate the score:     Age: 47 years     Sex: Male     Is Non-Hispanic African American: No     Diabetic: No     Tobacco smoker: No     Systolic Blood Pressure: 333 mmHg     Is BP treated: No     HDL Cholesterol: 37.6 mg/dL     Total Cholesterol: 206 mg/dL

## 2018-03-21 NOTE — Assessment & Plan Note (Signed)
Stable period on tegretol. Sees neurology once a year

## 2018-03-21 NOTE — Progress Notes (Signed)
BP 118/80 (BP Location: Left Arm, Patient Position: Sitting, Cuff Size: Normal)   Pulse 73   Temp 97.7 F (36.5 C) (Oral)   Ht 5\' 11"  (1.803 m)   Wt 207 lb 12 oz (94.2 kg)   SpO2 97%   BMI 28.98 kg/m    CC: AMW Subjective:    Patient ID: Edward Adkins, male    DOB: 1971-07-19, 47 y.o.   MRN: [id number]  HPI: ANCELMO Adkins is a 47 y.o. male presenting on 03/21/2018 for Medicare Wellness    Hearing Screening   125Hz  250Hz  500Hz  1000Hz  2000Hz  3000Hz  4000Hz  6000Hz  8000Hz   Right ear:   40 40 20  40    Left ear:   0 0 0  0      Visual Acuity Screening   Right eye Left eye Both eyes  Without correction: 20/20 20/25 20/20   With correction: 20/25 20/25 20/20   Failed hearing screen left ear - denies trouble.  Passes fall and depression screens  Cerebral palsy with mild developmental delay on carbamazepine.   Preventative: Colon cancer screening - pt states had colonoscopy through [facility] - will request records.  Prostate cancer screening - not due yet. +fmhx Lung cancer screening - not indicated Flu shot yearly at [facility] Tetanus shot - unsure Pneumonia shot - not due yet Shingles shot - not due yet Advanced directive discussion - has not set up. Would want father to be [facility] proxy.  Seat belt use discussed Sunscreen use discussed, no changing moles on skin Non smoker Alcohol - none Dentist Q6 mo Eye exam - has not seen recently  Patient is single and lives with his father.  Patient drinks two caffeine drinks daily.  Patient is ambi-dextrous.  Edu: Patient has a high school education.  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  Activity: plays golf, mows yard.  Diet: drinking more water. fruits/vegetables daily  Relevant past medical, surgical, family and social history reviewed and updated as indicated. Interim medical history since our last visit reviewed. Allergies and medications reviewed and updated. Outpatient Medications Prior to Visit  Medication Sig Dispense  Refill  . carbamazepine (TEGRETOL) 200 MG tablet take 1 tablet by mouth three times a day 270 tablet 4  . Multiple Vitamin (MULTIVITAMIN) tablet Take 2 tablets by mouth daily.      No facility-administered medications prior to visit.      Per HPI unless specifically indicated in ROS section below Review of Systems     Objective:    BP 118/80 (BP Location: Left Arm, Patient Position: Sitting, Cuff Size: Normal)   Pulse 73   Temp 97.7 F (36.5 C) (Oral)   Ht 5\' 11"  (1.803 m)   Wt 207 lb 12 oz (94.2 kg)   SpO2 97%   BMI 28.98 kg/m   Wt Readings from Last 3 Encounters:  03/21/18 207 lb 12 oz (94.2 kg)  01/03/18 210 lb 6.4 oz (95.4 kg)  03/19/17 209 lb 8 oz (95 kg)    Physical Exam  Constitutional: He is oriented to person, place, and time. He appears well-developed and well-nourished. No distress.  HENT:  Head: Normocephalic and atraumatic.  Right Ear: Hearing, tympanic membrane, external ear and ear canal normal.  Left Ear: Hearing, tympanic membrane, external ear and ear canal normal.  Nose: Nose normal.  Mouth/Throat: Uvula is midline, oropharynx is clear and moist and mucous membranes are normal. No oropharyngeal exudate, posterior oropharyngeal edema or posterior oropharyngeal erythema.  Eyes:  Pupils are equal, round, and reactive to light. Conjunctivae and EOM are normal. No scleral icterus.  Neck: Normal range of motion. Neck supple. Thyromegaly (mild enlargement without nodularity) present.  Cardiovascular: Normal rate, regular rhythm, normal heart sounds and intact distal pulses.  No murmur heard. Pulses:      Radial pulses are 2+ on the right side, and 2+ on the left side.  Pulmonary/Chest: Effort normal and breath sounds normal. No respiratory distress. He has no wheezes. He has no rales.  Abdominal: Soft. Bowel sounds are normal. He exhibits no distension and no mass. There is no tenderness. There is no rebound and no guarding.  Musculoskeletal: Normal range of  motion. He exhibits no edema.  Lymphadenopathy:    He has no cervical adenopathy.  Neurological: He is alert and oriented to person, place, and time.  CN grossly intact, station and gait intact Recall 3/3 Calculation 4/5 serial 3s  Skin: Skin is warm and dry. No rash noted.  Psychiatric: He has a normal mood and affect. His behavior is normal. Judgment and thought content normal.  Nursing note and vitals reviewed.  Results for orders placed or performed in visit on 03/18/18  T4, free  Result Value Ref Range   Free T4 0.63 0.60 - 1.60 ng/dL  TSH  Result Value Ref Range   TSH 8.25 (H) 0.35 - 4.50 uIU/mL  Lipid panel  Result Value Ref Range   Cholesterol 206 (H) 0 - 200 mg/dL   Triglycerides 149.0 0.0 - 149.0 mg/dL   HDL 37.60 (L) >39.00 mg/dL   VLDL 29.8 0.0 - 40.0 mg/dL   LDL Cholesterol 139 (H) 0 - 99 mg/dL   Total CHOL/HDL Ratio 5    NonHDL 168.72   T3  Result Value Ref Range   T3, Total 76 76 - 181 ng/dL      Assessment & Plan:   Problem List Items Addressed This Visit    Mild developmental delay    Stable period. Continues employment      Medicare annual wellness visit, subsequent - Primary    I have personally reviewed the Medicare Annual Wellness questionnaire and have noted 1. The patient's medical and social history 2. Their use of alcohol, tobacco or illicit drugs 3. Their current medications and supplements 4. The patient's functional ability including ADL's, fall risks, home safety risks and hearing or visual impairment. Cognitive function has been assessed and addressed as indicated.  5. Diet and physical activity 6. Evidence for depression or mood disorders The patients weight, height, BMI have been recorded in the chart. I have made referrals, counseling and provided education to the patient based on review of the above and I have provided the pt with a written personalized care plan for preventive services. Provider list updated.. See scanned  questionairre as needed for further documentation. Reviewed preventative protocols and updated unless pt declined.       Generalized convulsive epilepsy ([place])    Stable period on tegretol. Sees neurology once a year      Dyslipidemia    Mild off meds. Reviewed FLP. The 10-year ASCVD risk score [name] [name]., et al., 2013) is: 2.6%   Values used to calculate the score:     Age: 74 years     Sex: Male     Is Non-Hispanic African American: No     Diabetic: No     Tobacco smoker: No     Systolic Blood Pressure: 332 mmHg     Is  BP treated: No     HDL Cholesterol: 37.6 mg/dL     Total Cholesterol: 206 mg/dL       Cerebral palsy ([facility])   Borderline hypothyroidism    Progressive. Endorses some lethargy. Will trial 77mcg levothyroxine. He takes tegretol in the morning - I have asked him to take thyroid replacement at bedtime - 1-2 hours after dinner. RTC 3 mo lab visit only, RTC 6 mo f/u visit.       Relevant Medications   levothyroxine (SYNTHROID, LEVOTHROID) 50 MCG tablet   Advanced care planning/counseling discussion    Advanced directive discussion - has not set up. Would want father to be [facility] proxy.           Meds ordered this encounter  Medications  . levothyroxine (SYNTHROID, LEVOTHROID) 50 MCG tablet    Sig: Take 1 tablet (50 mcg total) by mouth at bedtime.    Dispense:  30 tablet    Refill:  6   No orders of the defined types were placed in this encounter.   Follow up plan: Return in about 1 year (around 03/22/2019) for medicare wellness visit.  [name], MD

## 2018-03-21 NOTE — Assessment & Plan Note (Addendum)
Stable period. Continues employment

## 2018-03-21 NOTE — Assessment & Plan Note (Signed)

## 2018-06-19 ENCOUNTER — Other Ambulatory Visit: Payer: Self-pay | Admitting: Family Medicine

## 2018-06-19 DIAGNOSIS — R946 Abnormal results of thyroid function studies: Secondary | ICD-10-CM

## 2018-06-19 DIAGNOSIS — E039 Hypothyroidism, unspecified: Secondary | ICD-10-CM

## 2018-06-19 DIAGNOSIS — E785 Hyperlipidemia, unspecified: Secondary | ICD-10-CM

## 2018-06-21 ENCOUNTER — Other Ambulatory Visit (INDEPENDENT_AMBULATORY_CARE_PROVIDER_SITE_OTHER): Payer: Medicare Other

## 2018-06-21 DIAGNOSIS — R946 Abnormal results of thyroid function studies: Secondary | ICD-10-CM

## 2018-06-21 DIAGNOSIS — Z23 Encounter for immunization: Secondary | ICD-10-CM | POA: Diagnosis not present

## 2018-06-21 DIAGNOSIS — E039 Hypothyroidism, unspecified: Secondary | ICD-10-CM

## 2018-06-21 LAB — T4, FREE: FREE T4: 0.97 ng/dL (ref 0.60–1.60)

## 2018-06-21 LAB — TSH: TSH: 2.27 u[IU]/mL (ref 0.35–4.50)

## 2018-07-18 ENCOUNTER — Other Ambulatory Visit: Payer: Self-pay | Admitting: Diagnostic Neuroimaging

## 2018-09-23 ENCOUNTER — Ambulatory Visit (INDEPENDENT_AMBULATORY_CARE_PROVIDER_SITE_OTHER): Payer: Medicare Other | Admitting: Family Medicine

## 2018-09-23 ENCOUNTER — Encounter: Payer: Self-pay | Admitting: Family Medicine

## 2018-09-23 VITALS — BP 118/82 | HR 76 | Temp 97.8°F | Ht 71.0 in | Wt 202.5 lb

## 2018-09-23 DIAGNOSIS — E039 Hypothyroidism, unspecified: Secondary | ICD-10-CM | POA: Diagnosis not present

## 2018-09-23 MED ORDER — LEVOTHYROXINE SODIUM 50 MCG PO TABS: 50.0000 ug | ORAL_TABLET | Freq: Every day | ORAL | 3 refills | Status: DC

## 2018-09-23 NOTE — Assessment & Plan Note (Addendum)
Stable period on levothyroxine, tolerating well. Feels it is helpful. Continue current regimen.

## 2018-09-23 NOTE — Patient Instructions (Signed)
You are doing well today! Keep up the good work Continue thyroid medicine nightly Return in 6 months for wellness visit.

## 2018-09-23 NOTE — Progress Notes (Signed)
BP 118/82 (BP Location: Left Arm, Patient Position: Sitting, Cuff Size: Normal)   Pulse 76   Temp 97.8 F (36.6 C) (Oral)   Ht 5\' 11"  (1.803 m)   Wt 202 lb 8 oz (91.9 kg)   SpO2 97%   BMI 28.24 kg/m    CC: 6 mo f/u visit Subjective:    Patient ID: Edward Adkins, male    DOB: 10-28-1970, 48 y.o.   MRN: [id number]  HPI: Edward Adkins is a 48 y.o. male presenting on 09/23/2018 for Follow-up (Here for 6 mo f/u.)   Borderline hypothyroidism - last visit we started 44mcg levothyroxine. Due for TFTs today. Tolerating well. Feels this has helped energy levels. Doesn't feel as sluggish.   Weight has dropped 5 lbs - eating more fruits/vegetables, drinking water.      Relevant past medical, surgical, family and social history reviewed and updated as indicated. Interim medical history since our last visit reviewed. Allergies and medications reviewed and updated. Outpatient Medications Prior to Visit  Medication Sig Dispense Refill  . carbamazepine (TEGRETOL) 200 MG tablet TAKE 1 TABLET BY MOUTH THREE TIMES A DAY 270 tablet 3  . Multiple Vitamin (MULTIVITAMIN) tablet Take 2 tablets by mouth daily.     Marland Kitchen levothyroxine (SYNTHROID, LEVOTHROID) 50 MCG tablet Take 1 tablet (50 mcg total) by mouth at bedtime. 30 tablet 6   No facility-administered medications prior to visit.      Per HPI unless specifically indicated in ROS section below Review of Systems Objective:    BP 118/82 (BP Location: Left Arm, Patient Position: Sitting, Cuff Size: Normal)   Pulse 76   Temp 97.8 F (36.6 C) (Oral)   Ht 5\' 11"  (1.803 m)   Wt 202 lb 8 oz (91.9 kg)   SpO2 97%   BMI 28.24 kg/m   Wt Readings from Last 3 Encounters:  09/23/18 202 lb 8 oz (91.9 kg)  03/21/18 207 lb 12 oz (94.2 kg)  01/03/18 210 lb 6.4 oz (95.4 kg)    Physical Exam Vitals signs and nursing note reviewed.  HENT:     Head: Normocephalic and atraumatic.     Mouth/Throat:     Mouth: Mucous membranes are moist.     Pharynx:  Oropharynx is clear.  Neck:     Thyroid: No thyromegaly.  Cardiovascular:     Rate and Rhythm: Normal rate and regular rhythm.     Pulses: Normal pulses.     Heart sounds: Normal heart sounds. No murmur.  Pulmonary:     Effort: Pulmonary effort is normal. No respiratory distress.     Breath sounds: Normal breath sounds. No wheezing, rhonchi or rales.  Musculoskeletal:     Right lower leg: No edema.     Left lower leg: No edema.  Psychiatric:        Mood and Affect: Mood normal.       Results for orders placed or performed in visit on 06/21/18  T4, free  Result Value Ref Range   Free T4 0.97 0.60 - 1.60 ng/dL  TSH  Result Value Ref Range   TSH 2.27 0.35 - 4.50 uIU/mL   Assessment & Plan:   Problem List Items Addressed This Visit    Acquired hypothyroidism - Primary    Stable period on levothyroxine, tolerating well. Feels it is helpful. Continue current regimen.       Relevant Medications   levothyroxine (SYNTHROID, LEVOTHROID) 50 MCG tablet       Meds  ordered this encounter  Medications  . levothyroxine (SYNTHROID, LEVOTHROID) 50 MCG tablet    Sig: Take 1 tablet (50 mcg total) by mouth at bedtime.    Dispense:  90 tablet    Refill:  3   No orders of the defined types were placed in this encounter.   Follow up plan: Return in about 6 months (around 03/24/2019) for medicare wellness visit.  [name], MD

## 2019-01-06 ENCOUNTER — Telehealth: Payer: Self-pay | Admitting: *Deleted

## 2019-01-06 NOTE — Telephone Encounter (Signed)
LM with father of pt.  Pt will not be home till late.  They do not have computer.  ? Telephone with camera but not sure.  I will place as a TELEPHONE VISIT per father until speaking with son, pt.  Due to current COVID 19 pandemic, our office is severely reducing in office visits until further notice, in order to minimize the risk to our patients and healthcare providers.  Pt understands that although there may be some limitations with this type of visit, we will take all precautions to reduce any security or privacy concerns.  Pt understands that this will be treated like an in office visit and we will file with pt's insurance, and there may be a patient responsible charge related to this service.  Pt understands that although there may be some limitations with this type of visit, we will take all precautions to reduce any security or privacy concerns.  Pt understands that this will be treated like an in office visit and we will file with pt's insurance.

## 2019-01-07 NOTE — Progress Notes (Signed)
PATIENT: Edward Adkins DOB: 22-Sep-1970  REASON FOR VISIT: follow up HISTORY FROM: patient  Virtual Visit via Telephone Note  I connected with Edward Adkins on 01/08/19 at 11:30 AM EDT by telephone and verified that I am speaking with the correct person using two identifiers.   I discussed the limitations, risks, security and privacy concerns of performing an evaluation and management service by telephone and the availability of in person appointments. I also discussed with the patient that there may be a patient responsible charge related to this service. The patient expressed understanding and agreed to proceed.   History of Present Illness:  01/08/19 Edward Adkins is a 48 y.o. male for follow up of seizure. He reports doing well on carbamazepine 200 mg 3 times daily.  He is tolerating medication well with no obvious adverse effects.  He denies any seizure activity.  Last seizure in September 1991.    HISTORY (copied from Dysart Martin's note on 01/03/2018)  Update 5/2/2019CM Edward Adkins, 48 year old male returns for follow-up with history of seizure disorder complex partial with secondary generalization.  He is currently on carbamazepine 200 mg 1 tablet 3 times daily.  He denies any side effects to the medication.  He denies any falls or balance issues. His last seizure was in September 1991.  He continues to work at [employer].  He exercises by playing golf.  He has a history of cerebral palsy mild mental retardation as well he returns for reevaluation.  No new medical issues.  He returns for reevaluation  UPDATE 08/10/16 (VRP): Since last visit, doing well. No sz. Last seizure was Sept 1991.Continues to work at [employer]. He takes CBZ 200mg  TID (5am, 6pm, 11pm).  PRIOR HPI(CM): Patient has history ofcerebral palsy, mild mental retardation and seizure disorder. He has been followed at [facility] since 1984, onset of seizure disorder at age 56. 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 He was originally on phenobarbital and developed a rash, switched to Dilantin with recurrent seizures. Initiated on carbamazepine in 1991without further seizure activity since that time. Denies any side effects to his medications, any daytime drowsiness, feelings of being off balance, no missed doses of his medication, and no falls.    Observations/Objective:  Generalized: Well developed, in no acute distress  Mentation: Alert oriented to time, place, history taking. Follows all commands speech and language fluent   Assessment and Plan:  48 y.o. year old male  has a past medical history of Cerebral palsy ([place]), Complex partial seizure disorder ([place]) (05/78), Diverticulitis (10/2014), Diverticulitis of intestine with perforation (10/14/2014), Diverticulosis (10/2014), Intra-abdominal abscess ([place]), and Mild developmental delay. with    ICD-10-CM   1. Partial symptomatic epilepsy with complex partial seizures, intractable, without status epilepticus ([place]) G40.219 CMP    CBC with Differential/Platelets    Carbamazepine level, total   Edward Adkins continues to do well on carbamazepine 200 mg 3 times daily.  We will continue this therapy.  Last set of labs was drawn May 2019.  I have advised that he return in about 4 weeks to our office to repeat labs.  He was instructed to call with any concerns of seizure activity.  He verbalizes understanding and agreement with this plan.  Orders Placed This Encounter  Procedures  . CMP    Standing Status:   Future    Standing Expiration Date:  01/08/2020  . CBC with Differential/Platelets    Standing Status:   Future    Standing Expiration Date:   01/08/2020  . Carbamazepine level, total    Standing Status:   Future    Standing Expiration Date:   01/08/2020    Meds ordered this encounter  Medications  . carbamazepine (TEGRETOL) 200 MG tablet    Sig:  Take 1 tablet (200 mg total) by mouth 3 (three) times daily.    Dispense:  270 tablet    Refill:  3    Order Specific Question:   Supervising Provider    Answer:   Melvenia Beam [id number]     Follow Up Instructions:  I discussed the assessment and treatment plan with the patient. The patient was provided an opportunity to ask questions and all were answered. The patient agreed with the plan and demonstrated an understanding of the instructions.   The patient was advised to call back or seek an in-person evaluation if the symptoms worsen or if the condition fails to improve as anticipated.  I provided 25 minutes of non-face-to-face time during this encounter.  Patient is located at his place of residence during teleconference.  Video conference not performed due to lack of technology.  Provider is located at her place of residence.  Liane Comber, RN helped to facilitate visit.   Debbora Presto, NP

## 2019-01-08 ENCOUNTER — Other Ambulatory Visit: Payer: Self-pay

## 2019-01-08 ENCOUNTER — Encounter: Payer: Self-pay | Admitting: Family Medicine

## 2019-01-08 ENCOUNTER — Ambulatory Visit (INDEPENDENT_AMBULATORY_CARE_PROVIDER_SITE_OTHER): Payer: Medicare Other | Admitting: Family Medicine

## 2019-01-08 ENCOUNTER — Ambulatory Visit: Payer: Medicare Other | Admitting: Nurse Practitioner

## 2019-01-08 DIAGNOSIS — G40219 Localization-related (focal) (partial) symptomatic epilepsy and epileptic syndromes with complex partial seizures, intractable, without status epilepticus: Secondary | ICD-10-CM | POA: Diagnosis not present

## 2019-01-08 MED ORDER — CARBAMAZEPINE 200 MG PO TABS: 200.0000 mg | ORAL_TABLET | Freq: Three times a day (TID) | ORAL | 3 refills | Status: DC

## 2019-01-08 NOTE — Progress Notes (Signed)
I reviewed note and agree with plan.   Penni Bombard, MD 03/08/9162, 8:46 PM Certified in Neurology, Neurophysiology and Neuroimaging  [facility] [address], [place], [place] [address] [phone]

## 2019-01-22 ENCOUNTER — Telehealth: Payer: Self-pay

## 2019-01-22 NOTE — Telephone Encounter (Signed)
Follow up has been scheduled. Patient is aware of appt day and time.   

## 2019-02-03 ENCOUNTER — Other Ambulatory Visit: Payer: Self-pay

## 2019-02-03 ENCOUNTER — Other Ambulatory Visit (INDEPENDENT_AMBULATORY_CARE_PROVIDER_SITE_OTHER): Payer: Self-pay

## 2019-02-03 DIAGNOSIS — Z0289 Encounter for other administrative examinations: Secondary | ICD-10-CM

## 2019-02-03 DIAGNOSIS — G40219 Localization-related (focal) (partial) symptomatic epilepsy and epileptic syndromes with complex partial seizures, intractable, without status epilepticus: Secondary | ICD-10-CM

## 2019-02-04 ENCOUNTER — Telehealth: Payer: Self-pay

## 2019-02-04 LAB — COMPREHENSIVE METABOLIC PANEL
ALT: 19 IU/L (ref 0–44)
AST: 14 IU/L (ref 0–40)
Albumin/Globulin Ratio: 1.5 (ref 1.2–2.2)
Albumin: 4.6 g/dL (ref 4.0–5.0)
Alkaline Phosphatase: 69 IU/L (ref 39–117)
BUN/Creatinine Ratio: 17 (ref 9–20)
BUN: 12 mg/dL (ref 6–24)
Bilirubin Total: 0.3 mg/dL (ref 0.0–1.2)
CO2: 25 mmol/L (ref 20–29)
Calcium: 10 mg/dL (ref 8.7–10.2)
Chloride: 98 mmol/L (ref 96–106)
Creatinine, Ser: 0.72 mg/dL — ABNORMAL LOW (ref 0.76–1.27)
GFR calc Af Amer: 129 mL/min/{1.73_m2} (ref >59)
GFR calc non Af Amer: 112 mL/min/{1.73_m2} (ref >59)
Globulin, Total: 3.1 g/dL (ref 1.5–4.5)
Glucose: 86 mg/dL (ref 65–99)
Potassium: 4.5 mmol/L (ref 3.5–5.2)
Sodium: 141 mmol/L (ref 134–144)
Total Protein: 7.7 g/dL (ref 6.0–8.5)

## 2019-02-04 LAB — CBC WITH DIFFERENTIAL/PLATELET
Basophils Absolute: 0 10*3/uL (ref 0.0–0.2)
Basos: 1 %
EOS (ABSOLUTE): 0.1 10*3/uL (ref 0.0–0.4)
Eos: 3 %
Hematocrit: 44.4 % (ref 37.5–51.0)
Hemoglobin: 16.1 g/dL (ref 13.0–17.7)
Immature Grans (Abs): 0 10*3/uL (ref 0.0–0.1)
Immature Granulocytes: 0 %
Lymphocytes Absolute: 1.4 10*3/uL (ref 0.7–3.1)
Lymphs: 24 %
MCH: 32.6 pg (ref 26.6–33.0)
MCHC: 36.3 g/dL — ABNORMAL HIGH (ref 31.5–35.7)
MCV: 90 fL (ref 79–97)
Monocytes Absolute: 0.5 10*3/uL (ref 0.1–0.9)
Monocytes: 9 %
Neutrophils Absolute: 3.7 10*3/uL (ref 1.4–7.0)
Neutrophils: 63 %
Platelets: 217 10*3/uL (ref 150–450)
RBC: 4.94 x10E6/uL (ref 4.14–5.80)
RDW: 11.9 % (ref 11.6–15.4)
WBC: 5.7 10*3/uL (ref 3.4–10.8)

## 2019-02-04 LAB — CARBAMAZEPINE LEVEL, TOTAL: Carbamazepine (Tegretol), S: 8.5 ug/mL (ref 4.0–12.0)

## 2019-02-04 NOTE — Telephone Encounter (Signed)
Spoke with the patient and he verbalized understanding his results. No questions or concerns at this time.   

## 2019-02-04 NOTE — Telephone Encounter (Signed)
-----   Message from Debbora Presto, NP sent at 02/04/2019 10:46 AM EDT ----- Labs are normal

## 2019-03-27 ENCOUNTER — Telehealth: Payer: Self-pay

## 2019-03-27 NOTE — Telephone Encounter (Signed)
Edward Adkins - Client Nonclinical Telephone Record AccessNurse Client Edward Adkins - Client Client Site [place] Physician [name] - MD Contact Type Call Who Is Calling Patient / Member / Family / Caregiver Caller Name Edward Adkins Phone Number [phone] Call Type Message Only Information Provided Reason for Call Returning a Call from the Office Initial [place] states he is returning a call from the office. Additional Comment Call Closed By: [place] Transaction Date/Time: 03/26/2019 5:23:09 PM (ET

## 2019-03-27 NOTE — Telephone Encounter (Signed)
Pt returning your call for Covid screening for upcoming appt

## 2019-03-27 NOTE — Telephone Encounter (Signed)
Pt called and spoke with Princess Bruins regarding his appointments coming up.

## 2019-03-28 ENCOUNTER — Other Ambulatory Visit: Payer: Self-pay | Admitting: Family Medicine

## 2019-03-28 ENCOUNTER — Ambulatory Visit (INDEPENDENT_AMBULATORY_CARE_PROVIDER_SITE_OTHER): Payer: Medicare Other

## 2019-03-28 ENCOUNTER — Other Ambulatory Visit (INDEPENDENT_AMBULATORY_CARE_PROVIDER_SITE_OTHER): Payer: Medicare Other

## 2019-03-28 VITALS — Ht 68.0 in | Wt 200.0 lb

## 2019-03-28 DIAGNOSIS — E785 Hyperlipidemia, unspecified: Secondary | ICD-10-CM

## 2019-03-28 DIAGNOSIS — Z0000 Encounter for general adult medical examination without abnormal findings: Secondary | ICD-10-CM | POA: Diagnosis not present

## 2019-03-28 DIAGNOSIS — Z5181 Encounter for therapeutic drug level monitoring: Secondary | ICD-10-CM | POA: Diagnosis not present

## 2019-03-28 DIAGNOSIS — E039 Hypothyroidism, unspecified: Secondary | ICD-10-CM

## 2019-03-28 LAB — LIPID PANEL
Cholesterol: 196 mg/dL (ref 0–200)
HDL: 36 mg/dL — ABNORMAL LOW (ref >39.00)
LDL Cholesterol: 130 mg/dL — ABNORMAL HIGH (ref 0–99)
NonHDL: 160.43
Total CHOL/HDL Ratio: 5
Triglycerides: 154 mg/dL — ABNORMAL HIGH (ref 0.0–149.0)
VLDL: 30.8 mg/dL (ref 0.0–40.0)

## 2019-03-28 LAB — COMPREHENSIVE METABOLIC PANEL
ALT: 13 U/L (ref 0–53)
AST: 13 U/L (ref 0–37)
Albumin: 4.5 g/dL (ref 3.5–5.2)
Alkaline Phosphatase: 67 U/L (ref 39–117)
BUN: 14 mg/dL (ref 6–23)
CO2: 28 mEq/L (ref 19–32)
Calcium: 9.5 mg/dL (ref 8.4–10.5)
Chloride: 103 mEq/L (ref 96–112)
Creatinine, Ser: 0.81 mg/dL (ref 0.40–1.50)
GFR: 102.39 mL/min (ref >60.00)
Glucose, Bld: 85 mg/dL (ref 70–99)
Potassium: 4.4 mEq/L (ref 3.5–5.1)
Sodium: 138 mEq/L (ref 135–145)
Total Bilirubin: 0.5 mg/dL (ref 0.2–1.2)
Total Protein: 7.3 g/dL (ref 6.0–8.3)

## 2019-03-28 LAB — T4, FREE: Free T4: 1.29 ng/dL (ref 0.60–1.60)

## 2019-03-28 LAB — CBC WITH DIFFERENTIAL/PLATELET
Basophils Absolute: 0.1 10*3/uL (ref 0.0–0.1)
Basophils Relative: 0.9 % (ref 0.0–3.0)
Eosinophils Absolute: 0.2 10*3/uL (ref 0.0–0.7)
Eosinophils Relative: 3.7 % (ref 0.0–5.0)
HCT: 44.1 % (ref 39.0–52.0)
Hemoglobin: 14.5 g/dL (ref 13.0–17.0)
Lymphocytes Relative: 31.7 % (ref 12.0–46.0)
Lymphs Abs: 1.9 10*3/uL (ref 0.7–4.0)
MCHC: 33 g/dL (ref 30.0–36.0)
MCV: 93.4 fl (ref 78.0–100.0)
Monocytes Absolute: 0.6 10*3/uL (ref 0.1–1.0)
Monocytes Relative: 9.6 % (ref 3.0–12.0)
Neutro Abs: 3.2 10*3/uL (ref 1.4–7.7)
Neutrophils Relative %: 54.1 % (ref 43.0–77.0)
Platelets: 208 10*3/uL (ref 150.0–400.0)
RBC: 4.72 Mil/uL (ref 4.22–5.81)
RDW: 12.5 % (ref 11.5–15.5)
WBC: 5.9 10*3/uL (ref 4.0–10.5)

## 2019-03-28 LAB — TSH: TSH: 0.92 u[IU]/mL (ref 0.35–4.50)

## 2019-03-28 NOTE — Patient Instructions (Signed)
Edward Adkins , Thank you for taking time to come for your Medicare Wellness Visit. I appreciate your ongoing commitment to your health goals. Please review the following plan we discussed and let me know if I can assist you in the future.   Screening recommendations/referrals: Colonoscopy: 01/2015 Recommended yearly ophthalmology/optometry visit for glaucoma screening and checkup Recommended yearly dental visit for hygiene and checkup  Vaccinations: Influenza vaccine: 06/2018 Pneumococcal vaccine: n/a Tdap vaccine: due Shingles vaccine: n/a    Advanced directives: Advance directive discussed with you today.  give you the proper paperwork for you to fill out.   Conditions/risks identified: obese  Next appointment: 04/01/2019 at 9:30  Preventive Care 40-64 Years, Male Preventive care refers to lifestyle choices and visits with your health care provider that can promote health and wellness. What does preventive care include?  A yearly physical exam. This is also called an annual well check.  Dental exams once or twice a year.  Routine eye exams. Ask your health care provider how often you should have your eyes checked.  Personal lifestyle choices, including:  Daily care of your teeth and gums.  Regular physical activity.  Eating a healthy diet.  Avoiding tobacco and drug use.  Limiting alcohol use.  Practicing safe sex.  Taking low-dose aspirin every day starting at age 32. What happens during an annual well check? The services and screenings done by your health care provider during your annual well check will depend on your age, overall health, lifestyle risk factors, and family history of disease. Counseling  Your health care provider may ask you questions about your:  Alcohol use.  Tobacco use.  Drug use.  Emotional well-being.  Home and relationship well-being.  Sexual activity.  Eating habits.  Work and work Statistician. Screening  You may have the  following tests or measurements:  Height, weight, and BMI.  Blood pressure.  Lipid and cholesterol levels. These may be checked every 5 years, or more frequently if you are over 10 years old.  Skin check.  Lung cancer screening. You may have this screening every year starting at age 40 if you have a 30-pack-year history of smoking and currently smoke or have quit within the past 15 years.  Fecal occult blood test (FOBT) of the stool. You may have this test every year starting at age 48.  Flexible sigmoidoscopy or colonoscopy. You may have a sigmoidoscopy every 5 years or a colonoscopy every 10 years starting at age 22.  Prostate cancer screening. Recommendations will vary depending on your family history and other risks.  Hepatitis C blood test.  Hepatitis B blood test.  Sexually transmitted disease (STD) testing.  Diabetes screening. This is done by checking your blood sugar (glucose) after you have not eaten for a while (fasting). You may have this done every 1-3 years. Discuss your test results, treatment options, and if necessary, the need for more tests with your health care provider. Vaccines  Your health care provider may recommend certain vaccines, such as:  Influenza vaccine. This is recommended every year.  Tetanus, diphtheria, and acellular pertussis (Tdap, Td) vaccine. You may need a Td booster every 10 years.  Zoster vaccine. You may need this after age 64.  Pneumococcal 13-valent conjugate (PCV13) vaccine. You may need this if you have certain conditions and have not been vaccinated.  Pneumococcal polysaccharide (PPSV23) vaccine. You may need one or two doses if you smoke cigarettes or if you have certain conditions. Talk to your health care provider  about which screenings and vaccines you need and how often you need them. This information is not intended to replace advice given to you by your health care provider. Make sure you discuss any questions you have with  your health care provider. Document Released: 09/17/2015 Document Revised: 05/10/2016 Document Reviewed: 06/22/2015 Elsevier Interactive Patient Education  2017 [employer] Prevention in the Home Falls can cause injuries. They can happen to people of all ages. There are many things you can do to make your home safe and to help prevent falls. What can I do on the outside of my home?  Regularly fix the edges of walkways and driveways and fix any cracks.  Remove anything that might make you trip as you walk through a door, such as a raised step or threshold.  Trim any bushes or trees on the path to your home.  Use bright outdoor lighting.  Clear any walking paths of anything that might make someone trip, such as rocks or tools.  Regularly check to see if handrails are loose or broken. Make sure that both sides of any steps have handrails.  Any raised decks and porches should have guardrails on the edges.  Have any leaves, snow, or ice cleared regularly.  Use sand or salt on walking paths during winter.  Clean up any spills in your garage right away. This includes oil or grease spills. What can I do in the bathroom?  Use night lights.  Install grab bars by the toilet and in the tub and shower. Do not use towel bars as grab bars.  Use non-skid mats or decals in the tub or shower.  If you need to sit down in the shower, use a plastic, non-slip stool.  Keep the floor dry. Clean up any water that spills on the floor as soon as it happens.  Remove soap buildup in the tub or shower regularly.  Attach bath mats securely with double-sided non-slip rug tape.  Do not have throw rugs and other things on the floor that can make you trip. What can I do in the bedroom?  Use night lights.  Make sure that you have a light by your bed that is easy to reach.  Do not use any sheets or blankets that are too big for your bed. They should not hang down onto the floor.  Have a firm  chair that has side arms. You can use this for support while you get dressed.  Do not have throw rugs and other things on the floor that can make you trip. What can I do in the kitchen?  Clean up any spills right away.  Avoid walking on wet floors.  Keep items that you use a lot in easy-to-reach places.  If you need to reach something above you, use a strong step stool that has a grab bar.  Keep electrical cords out of the way.  Do not use floor polish or wax that makes floors slippery. If you must use wax, use non-skid floor wax.  Do not have throw rugs and other things on the floor that can make you trip. What can I do with my stairs?  Do not leave any items on the stairs.  Make sure that there are handrails on both sides of the stairs and use them. Fix handrails that are broken or loose. Make sure that handrails are as long as the stairways.  Check any carpeting to make sure that it is firmly attached to  the stairs. Fix any carpet that is loose or worn.  Avoid having throw rugs at the top or bottom of the stairs. If you do have throw rugs, attach them to the floor with carpet tape.  Make sure that you have a light switch at the top of the stairs and the bottom of the stairs. If you do not have them, ask someone to add them for you. What else can I do to help prevent falls?  Wear shoes that:  Do not have high heels.  Have rubber bottoms.  Are comfortable and fit you well.  Are closed at the toe. Do not wear sandals.  If you use a stepladder:  Make sure that it is fully opened. Do not climb a closed stepladder.  Make sure that both sides of the stepladder are locked into place.  Ask someone to hold it for you, if possible.  Clearly mark and make sure that you can see:  Any grab bars or handrails.  First and last steps.  Where the edge of each step is.  Use tools that help you move around (mobility aids) if they are needed. These include:  Canes.  Walkers.   Scooters.  Crutches.  Turn on the lights when you go into a dark area. Replace any light bulbs as soon as they burn out.  Set up your furniture so you have a clear path. Avoid moving your furniture around.  If any of your floors are uneven, fix them.  If there are any pets around you, be aware of where they are.  Review your medicines with your doctor. Some medicines can make you feel dizzy. This can increase your chance of falling. Ask your doctor what other things that you can do to help prevent falls. This information is not intended to replace advice given to you by your health care provider. Make sure you discuss any questions you have with your health care provider. Document Released: 06/17/2009 Document Revised: 01/27/2016 Document Reviewed: 09/25/2014 Elsevier Interactive Patient Education  2017 [employer].

## 2019-03-28 NOTE — Progress Notes (Signed)
Subjective:   Edward Adkins is a 48 y.o. male who presents for Medicare Annual/Subsequent preventive examination.  This visit type was conducted due to national recommendations for restrictions regarding the COVID-19 Pandemic (e.g. social distancing). This format is felt to be most appropriate for this patient at this time. All issues noted in this document were discussed and addressed. No physical exam was performed (except for noted visual exam findings with Video Visits). This patient, Edward Adkins, has given permission to perform this visit via telephone. Vital signs may be absent or patient reported.  Patient location:  At home  Nurse location:  At home    Review of Systems:  n/a Cardiac Risk Factors include: dyslipidemia;male gender;obesity (BMI >30kg/m2)     Objective:    Vitals: Ht 5\' 8"  (1.727 m) Comment: per patient  Wt 200 lb (90.7 kg) Comment: per patient  BMI 30.41 kg/m   Body mass index is 30.41 kg/m.  Advanced Directives 03/28/2019 08/09/2015 10/13/2014  Does Patient Have a Medical Advance Directive? No No No  Would patient like information on creating a medical advance directive? - Yes - Educational materials given No - patient declined information    Tobacco Social History   Tobacco Use  Smoking Status Never Smoker  Smokeless Tobacco Never Used     Counseling given: Not Answered   Clinical Intake:  Pre-visit preparation completed: Yes  Pain : No/denies pain     Nutritional Status: BMI > 30  Obese Nutritional Risks: None Diabetes: No  How often do you need to have someone help you when you read instructions, pamphlets, or other written materials from your doctor or pharmacy?: 1 - Never What is the last grade level you completed in school?: 12th grade  Interpreter Needed?: No  Information entered by :: NAllen LPN  Past Medical History:  Diagnosis Date  . Cerebral palsy ([place])   . Complex partial seizure disorder ([place]) 05/78   Head CT's  multiple-WNL, EEG R temporal lobe discharge ([facility])  . Diverticulitis 11/6466   complicated by -itis, intra abd abscess, and perforation  . Diverticulitis of intestine with perforation 10/14/2014  . Diverticulosis 0/3212   complicated by -itis, intra abd abscess, and perforation  . Intra-abdominal abscess ([place])   . Mild developmental delay    History reviewed. No pertinent surgical history. Family History  Problem Relation Age of Onset  . Cancer Mother 25       Leukemia  . Cancer Father 94       Prostate ca  . CAD Father   . Diabetes Father   . Thyroid disease Father    Social History   Socioeconomic History  . Marital status: Single    Spouse name: Not on file  . Number of children: 0  . Years of education: 76  . Highest education level: Not on file  Occupational History    Employer: [employer]  Social Needs  . Financial resource strain: Not hard at all  . Food insecurity    Worry: Never true    Inability: Never true  . Transportation needs    Medical: No    Non-medical: No  Tobacco Use  . Smoking status: Never Smoker  . Smokeless tobacco: Never Used  Substance and Sexual Activity  . Alcohol use: No    Alcohol/week: 0.0 standard drinks  . Drug use: No  . Sexual activity: Not Currently  Lifestyle  . Physical activity    Days per week: 2 days  Minutes per session: 150+ min  . Stress: Not at all  Relationships  . Social Herbalist on phone: Not on file    Gets together: Not on file    Attends religious service: Not on file    Active member of club or organization: Not on file    Attends meetings of clubs or organizations: Not on file    Relationship status: Not on file  Other Topics Concern  . Not on file  Social History Narrative   Patient is single and lives with his father.   Patient drinks two caffeine drinks daily.   Patient is ambi-dextrous.   Patient has a high school education.   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              Outpatient Encounter Medications as of 03/28/2019  Medication Sig  . carbamazepine (TEGRETOL) 200 MG tablet Take 1 tablet (200 mg total) by mouth 3 (three) times daily.  Marland Kitchen levothyroxine (SYNTHROID, LEVOTHROID) 50 MCG tablet Take 1 tablet (50 mcg total) by mouth at bedtime.  . Multiple Vitamin (MULTIVITAMIN) tablet Take 2 tablets by mouth daily.    No facility-administered encounter medications on file as of 03/28/2019.     Activities of Daily Living In your present state of health, do you have any difficulty performing the following activities: 03/28/2019  Hearing? N  Vision? N  Difficulty concentrating or making decisions? N  Walking or climbing stairs? N  Dressing or bathing? N  Doing errands, shopping? N  Preparing Food and eating ? N  Using the Toilet? N  In the past six months, have you accidently leaked urine? N  Do you have problems with loss of bowel control? N  Managing your Medications? N  Managing your Finances? N  Housekeeping or managing your Housekeeping? N  Some recent data might be hidden    Patient Care Team: [name], MD as PCP - General (Family Medicine) [name], MD (Family Medicine)   Assessment:   This is a routine wellness examination for Edward Adkins.  Exercise Activities and Dietary recommendations Current Exercise Habits: Home exercise routine, Time (Minutes): > 60, Frequency (Times/Week): 2, Weekly Exercise (Minutes/Week): 0  Goals    . Patient Stated     03/28/2019 , no goals set       Fall Risk Fall Risk  03/28/2019 03/21/2018 03/19/2017 03/13/2016 08/09/2015  Falls in the past year? 0 No No No No  Risk for fall due to : Medication side effect - - - -  Follow up Falls evaluation completed;Falls prevention discussed - - - -   Is the patient's home free of loose throw rugs in walkways, pet beds, electrical cords, etc?   yes      Grab bars in the bathroom? no      Handrails on the stairs?   n/a      Adequate lighting?   yes  Timed  Get Up and Go Performed: n/a  Depression Screen PHQ 2/9 Scores 03/28/2019 03/21/2018 03/19/2017 03/13/2016  PHQ - 2 Score 0 0 0 0  PHQ- 9 Score 0 - - -    Cognitive Function MMSE - Mini Mental State Exam 03/28/2019  Orientation to time 5  Orientation to Place 5  Registration 3  Attention/ Calculation 5  Recall 3  Language- name 2 objects 0  Language- repeat 1  Language- follow 3 step command 0  Language- read & follow direction 0  Write a sentence 0  Copy design 0  Total score 22   Mini Cog  Mini-Cog screen was completed. Maximum score is 22. A value of 0 denotes this part of the MMSE was not completed or the patient failed this part of the Mini-Cog screening.       Immunization History  Administered Date(s) Administered  . Influenza Whole 06/27/2007  . Influenza, Seasonal, Injecte, Preservative Fre 06/04/2014, 06/15/2016  . Influenza,inj,Quad PF,6+ Mos 06/21/2018  . Influenza-Unspecified 07/02/2017    Qualifies for Shingles Vaccine? no  Screening Tests Health Maintenance  Topic Date Due  . HIV Screening  10/19/1987  . DTaP/Tdap/Td (1 - Tdap) 10/19/1991  . TETANUS/TDAP  10/19/1991  . INFLUENZA VACCINE  04/05/2019   Cancer Screenings: Lung: Low Dose CT Chest recommended if Age 67-80 years, 30 pack-year currently smoking OR have quit w/in 15years. Patient does not qualify. Colorectal: up to date  Additional Screenings:  Hepatitis C Screening:n/a      Plan:    Patient plays golf twice a week for exercise.  I have personally reviewed and noted the following in the patient's chart:   . Medical and social history . Use of alcohol, tobacco or illicit drugs  . Current medications and supplements . Functional ability and status . Nutritional status . Physical activity . Advanced directives . List of other physicians . Hospitalizations, surgeries, and ER visits in previous 12 months . Vitals . Screenings to include cognitive, depression, and falls . Referrals  and appointments  In addition, I have reviewed and discussed with patient certain preventive protocols, quality metrics, and best practice recommendations. A written personalized care plan for preventive services as well as general preventive health recommendations were provided to patient.     Kellie Simmering, LPN  05/02/5620

## 2019-03-28 NOTE — Progress Notes (Signed)
PCP notes:  Health Maintenance:  HIV screening and tetanus  Abnormal Screenings:  None  Patient concerns:  None  Nurse concerns:  None  Next PCP appt.: 04/01/2019 at 9:30

## 2019-04-01 ENCOUNTER — Ambulatory Visit (INDEPENDENT_AMBULATORY_CARE_PROVIDER_SITE_OTHER): Payer: Medicare Other | Admitting: Family Medicine

## 2019-04-01 ENCOUNTER — Encounter: Payer: Self-pay | Admitting: Family Medicine

## 2019-04-01 ENCOUNTER — Other Ambulatory Visit: Payer: Self-pay

## 2019-04-01 VITALS — BP 118/82 | HR 74 | Temp 98.2°F | Ht 70.5 in | Wt 197.4 lb

## 2019-04-01 DIAGNOSIS — G40219 Localization-related (focal) (partial) symptomatic epilepsy and epileptic syndromes with complex partial seizures, intractable, without status epilepticus: Secondary | ICD-10-CM | POA: Diagnosis not present

## 2019-04-01 DIAGNOSIS — G809 Cerebral palsy, unspecified: Secondary | ICD-10-CM

## 2019-04-01 DIAGNOSIS — E785 Hyperlipidemia, unspecified: Secondary | ICD-10-CM

## 2019-04-01 DIAGNOSIS — Z7189 Other specified counseling: Secondary | ICD-10-CM | POA: Diagnosis not present

## 2019-04-01 DIAGNOSIS — E039 Hypothyroidism, unspecified: Secondary | ICD-10-CM | POA: Diagnosis not present

## 2019-04-01 DIAGNOSIS — R6250 Unspecified lack of expected normal physiological development in childhood: Secondary | ICD-10-CM

## 2019-04-01 MED ORDER — LEVOTHYROXINE SODIUM 50 MCG PO TABS: 50.0000 ug | ORAL_TABLET | Freq: Every day | ORAL | 3 refills | Status: DC

## 2019-04-01 NOTE — Assessment & Plan Note (Signed)
Advanced directive discussion - has not set up. Would want father to be [facility] proxy. Packet provided today.

## 2019-04-01 NOTE — Patient Instructions (Addendum)
Sign release of information form up front for request colonoscopy report from [place] 2016.  Advanced directive packet provided today.  You are doing well today.  Return in 1 year for next wellness visit, sooner if needed Health Maintenance, Male Adopting a healthy lifestyle and getting preventive care are important in promoting health and wellness. Ask your health care provider about:  The right schedule for you to have regular tests and exams.  Things you can do on your own to prevent diseases and keep yourself healthy. What should I know about diet, weight, and exercise? Eat a healthy diet   Eat a diet that includes plenty of vegetables, fruits, low-fat dairy products, and lean protein.  Do not eat a lot of foods that are high in solid fats, added sugars, or sodium. Maintain a healthy weight Body mass index (BMI) is a measurement that can be used to identify possible weight problems. It estimates body fat based on height and weight. Your health care provider can help determine your BMI and help you achieve or maintain a healthy weight. Get regular exercise Get regular exercise. This is one of the most important things you can do for your health. Most adults should:  Exercise for at least 150 minutes each week. The exercise should increase your heart rate and make you sweat (moderate-intensity exercise).  Do strengthening exercises at least twice a week. This is in addition to the moderate-intensity exercise.  Spend less time sitting. Even light physical activity can be beneficial. Watch cholesterol and blood lipids Have your blood tested for lipids and cholesterol at 46 years of age, then have this test every 5 years. You may need to have your cholesterol levels checked more often if:  Your lipid or cholesterol levels are high.  You are older than 46 years of age.  You are at high risk for heart disease. What should I know about cancer screening? Many types of cancers can be  detected early and may often be prevented. Depending on your health history and family history, you may need to have cancer screening at various ages. This may include screening for:  Colorectal cancer.  Prostate cancer.  Skin cancer.  Lung cancer. What should I know about heart disease, diabetes, and high blood pressure? Blood pressure and heart disease  High blood pressure causes heart disease and increases the risk of stroke. This is more likely to develop in people who have high blood pressure readings, are of African descent, or are overweight.  Talk with your health care provider about your target blood pressure readings.  Have your blood pressure checked: ? Every 3-5 years if you are 75-63 years of age. ? Every year if you are 59 years old or older.  If you are between the ages of 74 and 69 and are a current or former smoker, ask your health care provider if you should have a one-time screening for abdominal aortic aneurysm (AAA). Diabetes Have regular diabetes screenings. This checks your fasting blood sugar level. Have the screening done:  Once every three years after age 55 if you are at a normal weight and have a low risk for diabetes.  More often and at a younger age if you are overweight or have a high risk for diabetes. What should I know about preventing infection? Hepatitis B If you have a higher risk for hepatitis B, you should be screened for this virus. Talk with your health care provider to find out if you are at  risk for hepatitis B infection. Hepatitis C Blood testing is recommended for:  Everyone born from [birth year] through 1963.  Anyone with known risk factors for hepatitis C. Sexually transmitted infections (STIs)  You should be screened each year for STIs, including gonorrhea and chlamydia, if: ? You are sexually active and are younger than 46 years of age. ? You are older than 46 years of age and your health care provider tells you that you are at risk  for this type of infection. ? Your sexual activity has changed since you were last screened, and you are at increased risk for chlamydia or gonorrhea. Ask your health care provider if you are at risk.  Ask your health care provider about whether you are at high risk for HIV. Your health care provider may recommend a prescription medicine to help prevent HIV infection. If you choose to take medicine to prevent HIV, you should first get tested for HIV. You should then be tested every 3 months for as long as you are taking the medicine. Follow these instructions at home: Lifestyle  Do not use any products that contain nicotine or tobacco, such as cigarettes, e-cigarettes, and chewing tobacco. If you need help quitting, ask your health care provider.  Do not use street drugs.  Do not share needles.  Ask your health care provider for help if you need support or information about quitting drugs. Alcohol use  Do not drink alcohol if your health care provider tells you not to drink.  If you drink alcohol: ? Limit how much you have to 0-2 drinks a day. ? Be aware of how much alcohol is in your drink. In the [place] one drink equals one 12 oz bottle of beer (355 mL), one 5 oz glass of wine (148 mL), or one 1 oz glass of hard liquor (44 mL). General instructions  Schedule regular health, dental, and eye exams.  Stay current with your vaccines.  Tell your health care provider if: ? You often feel depressed. ? You have ever been abused or do not feel safe at home. Summary  Adopting a healthy lifestyle and getting preventive care are important in promoting health and wellness.  Follow your health care provider's instructions about healthy diet, exercising, and getting tested or screened for diseases.  Follow your health care provider's instructions on monitoring your cholesterol and blood pressure. This information is not intended to replace advice given to you by your health care provider.  Make sure you discuss any questions you have with your health care provider. Document Released: 02/17/2008 Document Revised: 08/14/2018 Document Reviewed: 08/14/2018 Elsevier Patient Education  2020 [employer].

## 2019-04-01 NOTE — Assessment & Plan Note (Signed)
No change in functional status noted.

## 2019-04-01 NOTE — Assessment & Plan Note (Signed)
Stable period with normal TFTs on low dose levothyroxine.

## 2019-04-01 NOTE — Progress Notes (Signed)
This visit was conducted in person.  BP 118/82 (BP Location: Left Arm, Patient Position: Sitting, Cuff Size: Normal)    Pulse 74    Temp 98.2 F (36.8 C) (Temporal)    Ht 5' 10.5" (1.791 m)    Wt 197 lb 7 oz (89.6 kg)    SpO2 98%    BMI 27.93 kg/m   No exam data present   CC: AMW f/u visit Subjective:    Patient ID: Edward Adkins, male    DOB: 06-13-1971, 48 y.o.   MRN: [id number]  HPI: Edward Adkins is a 48 y.o. male presenting on 04/01/2019 for Annual Exam (Pt 2. )   Saw health advisor last week for medicare wellness visit. Note reviewed.    Works at [employer]. Had vacation last week.   Cerebral palsy with mild developmental delay on carbamazepine.   Preventative: Colon cancer screening -pt states had colonoscopy 2016 through [facility] - but have not yet received records. Will request records. Prostate cancer screening - not due yet. +fmhx Lung cancer screening - not indicated Flu shotyearly at [facility] Tetanus shot -unsure Pneumonia shot - not due yet Shingles shot - not due yet Advanced directive discussion - has not set up. Would want father to be [facility] proxy. Packet provided today.  Seat belt use discussed Sunscreen use discussed, no changing moles on skin Non smoker Alcohol - none  Dentist Q6 mo  Eye exam - has not seen   Patient is single and lives with his father.  Patient drinks two caffeine drinks daily (sodas).  Patient is ambi-dextrous.  Edu: Patient has a high school education.  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  Activity: plays golf, mows yard.  Diet: drinking more water.fruits/vegetables daily     Relevant past medical, surgical, family and social history reviewed and updated as indicated. Interim medical history since our last visit reviewed. Allergies and medications reviewed and updated. Outpatient Medications Prior to Visit  Medication Sig Dispense Refill   carbamazepine (TEGRETOL) 200 MG tablet Take 1 tablet (200 mg total) by mouth 3  (three) times daily. 270 tablet 3   Multiple Vitamin (MULTIVITAMIN) tablet Take 2 tablets by mouth daily.      levothyroxine (SYNTHROID, LEVOTHROID) 50 MCG tablet Take 1 tablet (50 mcg total) by mouth at bedtime. 90 tablet 3   No facility-administered medications prior to visit.      Per HPI unless specifically indicated in ROS section below Review of Systems Objective:    BP 118/82 (BP Location: Left Arm, Patient Position: Sitting, Cuff Size: Normal)    Pulse 74    Temp 98.2 F (36.8 C) (Temporal)    Ht 5' 10.5" (1.791 m)    Wt 197 lb 7 oz (89.6 kg)    SpO2 98%    BMI 27.93 kg/m   Wt Readings from Last 3 Encounters:  04/01/19 197 lb 7 oz (89.6 kg)  03/28/19 200 lb (90.7 kg)  09/23/18 202 lb 8 oz (91.9 kg)    Physical Exam Vitals signs and nursing note reviewed.  Constitutional:      General: He is not in acute distress.    Appearance: Normal appearance. He is well-developed. He is not ill-appearing.  HENT:     Head: Normocephalic and atraumatic.     Right Ear: Hearing, tympanic membrane, ear canal and external ear normal.     Left Ear: Hearing, tympanic membrane, ear canal and external ear normal.  Nose: Nose normal.     Mouth/Throat:     Mouth: Mucous membranes are moist.     Pharynx: Uvula midline. No oropharyngeal exudate or posterior oropharyngeal erythema.  Eyes:     General: No scleral icterus.    Extraocular Movements: Extraocular movements intact.     Conjunctiva/sclera: Conjunctivae normal.     Pupils: Pupils are equal, round, and reactive to light.  Neck:     Musculoskeletal: Normal range of motion and neck supple.  Cardiovascular:     Rate and Rhythm: Normal rate and regular rhythm.     Pulses: Normal pulses.          Radial pulses are 2+ on the right side and 2+ on the left side.     Heart sounds: Normal heart sounds. No murmur.  Pulmonary:     Effort: Pulmonary effort is normal. No respiratory distress.     Breath sounds: Normal breath sounds. No  wheezing, rhonchi or rales.  Abdominal:     General: Abdomen is flat. Bowel sounds are normal. There is no distension.     Palpations: Abdomen is soft. There is no mass.     Tenderness: There is no abdominal tenderness. There is no guarding or rebound.     Hernia: No hernia is present.  Musculoskeletal: Normal range of motion.  Lymphadenopathy:     Cervical: No cervical adenopathy.  Skin:    General: Skin is warm and dry.     Findings: No rash.  Neurological:     General: No focal deficit present.     Mental Status: He is alert and oriented to person, place, and time.     Comments: CN grossly intact, station and gait intact  Psychiatric:        Mood and Affect: Mood normal.        Behavior: Behavior normal.        Thought Content: Thought content normal.        Judgment: Judgment normal.       Results for orders placed or performed in visit on 03/28/19  T4, free  Result Value Ref Range   Free T4 1.29 0.60 - 1.60 ng/dL  CBC with Differential/Platelet  Result Value Ref Range   WBC 5.9 4.0 - 10.5 K/uL   RBC 4.72 4.22 - 5.81 Mil/uL   Hemoglobin 14.5 13.0 - 17.0 g/dL   HCT 44.1 39.0 - 52.0 %   MCV 93.4 78.0 - 100.0 fl   MCHC 33.0 30.0 - 36.0 g/dL   RDW 12.5 11.5 - 15.5 %   Platelets 208.0 150.0 - 400.0 K/uL   Neutrophils Relative % 54.1 43.0 - 77.0 %   Lymphocytes Relative 31.7 12.0 - 46.0 %   Monocytes Relative 9.6 3.0 - 12.0 %   Eosinophils Relative 3.7 0.0 - 5.0 %   Basophils Relative 0.9 0.0 - 3.0 %   Neutro Abs 3.2 1.4 - 7.7 K/uL   Lymphs Abs 1.9 0.7 - 4.0 K/uL   Monocytes Absolute 0.6 0.1 - 1.0 K/uL   Eosinophils Absolute 0.2 0.0 - 0.7 K/uL   Basophils Absolute 0.1 0.0 - 0.1 K/uL  TSH  Result Value Ref Range   TSH 0.92 0.35 - 4.50 uIU/mL  Comprehensive metabolic panel  Result Value Ref Range   Sodium 138 135 - 145 mEq/L   Potassium 4.4 3.5 - 5.1 mEq/L   Chloride 103 96 - 112 mEq/L   CO2 28 19 - 32 mEq/L   Glucose, Bld 85 70 -  99 mg/dL   BUN 14 6 - 23 mg/dL     Creatinine, Ser 0.81 0.40 - 1.50 mg/dL   Total Bilirubin 0.5 0.2 - 1.2 mg/dL   Alkaline Phosphatase 67 39 - 117 U/L   AST 13 0 - 37 U/L   ALT 13 0 - 53 U/L   Total Protein 7.3 6.0 - 8.3 g/dL   Albumin 4.5 3.5 - 5.2 g/dL   Calcium 9.5 8.4 - 10.5 mg/dL   GFR 102.39 >60.00 mL/min  Lipid panel  Result Value Ref Range   Cholesterol 196 0 - 200 mg/dL   Triglycerides 154.0 (H) 0.0 - 149.0 mg/dL   HDL 36.00 (L) >39.00 mg/dL   VLDL 30.8 0.0 - 40.0 mg/dL   LDL Cholesterol 130 (H) 0 - 99 mg/dL   Total CHOL/HDL Ratio 5    NonHDL 160.43    Assessment & Plan:   Problem List Items Addressed This Visit    Mild developmental delay   Dyslipidemia    Chronic, stable off meds. The 10-year ASCVD risk score [name] [name]., et al., 2013) is: 2.8%   Values used to calculate the score:     Age: 37 years     Sex: Male     Is Non-Hispanic African American: No     Diabetic: No     Tobacco smoker: No     Systolic Blood Pressure: 782 mmHg     Is BP treated: No     HDL Cholesterol: 36 mg/dL     Total Cholesterol: 196 mg/dL       Complex partial seizures ([place])    Appreciate neurology care. On tegretol. Recent levels normal.       Cerebral palsy (HCC)    No change in functional status noted.       Advanced care planning/counseling discussion - Primary    Advanced directive discussion - has not set up. Would want father to be [facility] proxy. Packet provided today.       Acquired hypothyroidism    Stable period with normal TFTs on low dose levothyroxine.       Relevant Medications   levothyroxine (SYNTHROID) 50 MCG tablet       Meds ordered this encounter  Medications   levothyroxine (SYNTHROID) 50 MCG tablet    Sig: Take 1 tablet (50 mcg total) by mouth at bedtime.    Dispense:  90 tablet    Refill:  3   No orders of the defined types were placed in this encounter.   Follow up plan: Return in about 1 year (around 03/31/2020) for medicare wellness visit.  [name], MD

## 2019-04-01 NOTE — Assessment & Plan Note (Signed)
Chronic, stable off meds. The 10-year ASCVD risk score [name] [name]., et al., 2013) is: 2.8%   Values used to calculate the score:     Age: 48 years     Sex: Male     Is Non-Hispanic African American: No     Diabetic: No     Tobacco smoker: No     Systolic Blood Pressure: 496 mmHg     Is BP treated: No     HDL Cholesterol: 36 mg/dL     Total Cholesterol: 196 mg/dL

## 2019-04-01 NOTE — Assessment & Plan Note (Signed)
Appreciate neurology care. On tegretol. Recent levels normal.

## 2019-04-09 ENCOUNTER — Encounter: Payer: Self-pay | Admitting: Family Medicine

## 2019-07-25 DIAGNOSIS — Z23 Encounter for immunization: Secondary | ICD-10-CM | POA: Diagnosis not present

## 2020-01-08 ENCOUNTER — Other Ambulatory Visit: Payer: Self-pay

## 2020-01-08 ENCOUNTER — Ambulatory Visit (INDEPENDENT_AMBULATORY_CARE_PROVIDER_SITE_OTHER): Payer: Medicare Other | Admitting: Family Medicine

## 2020-01-08 ENCOUNTER — Encounter: Payer: Self-pay | Admitting: Family Medicine

## 2020-01-08 VITALS — BP 128/73 | HR 84 | Temp 98.4°F | Ht 72.0 in | Wt 207.0 lb

## 2020-01-08 DIAGNOSIS — G40219 Localization-related (focal) (partial) symptomatic epilepsy and epileptic syndromes with complex partial seizures, intractable, without status epilepticus: Secondary | ICD-10-CM | POA: Diagnosis not present

## 2020-01-08 MED ORDER — CARBAMAZEPINE 200 MG PO TABS: 200.0000 mg | ORAL_TABLET | Freq: Three times a day (TID) | ORAL | 3 refills | Status: DC

## 2020-01-08 NOTE — Progress Notes (Signed)
PATIENT: Edward Adkins DOB: July 24, 1971  REASON FOR VISIT: follow up HISTORY FROM: patient  Chief Complaint  Patient presents with  . Follow-up    rm 5, alone   . Seizures     HISTORY OF PRESENT ILLNESS: Today 01/08/20 Edward Adkins is a 49 y.o. male here today for follow up for seizures. He is doing very well. He continues carbamazepine 200mg  TID. No seizures. He is tolerating meds well. He works part time at [employer]. He drives without difficulty. He lives alone.   HISTORY: (copied from my note on 01/08/2019)  Edward Adkins is a 49 y.o. male for follow up of seizure. He reports doing well on carbamazepine 200 mg 3 times daily.  He is tolerating medication well with no obvious adverse effects.  He denies any seizure activity.  Last seizure in September 1991.   HISTORY (copied from [facility] note on 01/03/2018)  Update 5/2/2019[name],49 year old male returns for follow-up with history of seizure disorder complex partial with secondary generalization. He is currently on carbamazepine 200 mg 1 tablet 3 times daily. He denies any side effects to the medication. He denies any falls or balance issues. Hislast seizure was in September 1991.He continues to work at [employer]. He exercises by playing golf. He has a history of cerebral palsy mild mental retardation as well he returns for reevaluation.No new medical issues. He returns for reevaluation  UPDATE 08/10/16 (VRP): Since last visit, doing well. No sz. Last seizure was Sept 1991.Continues to work at [employer]. He takes CBZ 200mg  TID (5am, 6pm, 11pm).  PRIOR HPI(CM): Patient has history ofcerebral palsy, mild mental retardation and seizure disorder. He has been followed at [facility] since 1984, onset of seizure disorder at age 79. 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 He was originally on phenobarbital  and developed a rash, switched to Dilantin with recurrent seizures. Initiated on carbamazepine in 1991without further seizure activity since that time. Denies any side effects to his medications, any daytime drowsiness, feelings of being off balance, no missed doses of his medication, and no falls.    REVIEW OF SYSTEMS: Out of a complete 14 system review of symptoms, the patient complains only of the following symptoms, seizures and all other reviewed systems are negative.  ALLERGIES: Allergies  Allergen Reactions  . Phenobarbital     REACTION: rash    HOME MEDICATIONS: Outpatient Medications Prior to Visit  Medication Sig Dispense Refill  . levothyroxine (SYNTHROID) 50 MCG tablet Take 1 tablet (50 mcg total) by mouth at bedtime. 90 tablet 3  . Multiple Vitamin (MULTIVITAMIN) tablet Take 2 tablets by mouth daily.     . carbamazepine (TEGRETOL) 200 MG tablet Take 1 tablet (200 mg total) by mouth 3 (three) times daily. 270 tablet 3   No facility-administered medications prior to visit.    PAST MEDICAL HISTORY: Past Medical History:  Diagnosis Date  . Cerebral palsy ([place])   . Complex partial seizure disorder ([place]) 05/78   Head CT's multiple-WNL, EEG R temporal lobe discharge ([facility])  . Diverticulitis Q000111Q   complicated by -itis, intra abd abscess, and perforation  . Diverticulitis of intestine with perforation 10/14/2014  . Diverticulosis Q000111Q   complicated by -itis, intra abd abscess, and perforation  . Intra-abdominal abscess ([place])   . Mild developmental delay     PAST SURGICAL HISTORY: Past Surgical History:  Procedure Laterality  Date  . COLONOSCOPY  2016   2 TA, 2 HP, diverticulosis, rpt 5 yrs Production assistant, radio)    FAMILY HISTORY: Family History  Problem Relation Age of Onset  . Cancer Mother 90       Leukemia  . Cancer Father 14       Prostate ca  . CAD Father   . Diabetes Father   . Thyroid disease Father     SOCIAL HISTORY: Social History   Socioeconomic  History  . Marital status: Single    Spouse name: Not on file  . Number of children: 0  . Years of education: 14  . Highest education level: Not on file  Occupational History    Employer: [employer]  Tobacco Use  . Smoking status: Never Smoker  . Smokeless tobacco: Never Used  Substance and Sexual Activity  . Alcohol use: No    Alcohol/week: 0.0 standard drinks  . Drug use: No  . Sexual activity: Not Currently  Other Topics Concern  . Not on file  Social History Narrative   Patient is single and lives with his father.   Patient drinks two caffeine drinks daily.   Patient is ambi-dextrous.   Patient has a high school education.   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            Social Determinants of Health   Financial Resource Strain: Low Risk   . Difficulty of Paying Living Expenses: Not hard at all  Food Insecurity: No Food Insecurity  . Worried About Charity fundraiser in the Last Year: Never true  . Ran Out of Food in the Last Year: Never true  Transportation Needs: No Transportation Needs  . Lack of Transportation (Medical): No  . Lack of Transportation (Non-Medical): No  Physical Activity: Sufficiently Active  . Days of Exercise per Week: 2 days  . Minutes of Exercise per Session: 150+ min  Stress: No Stress Concern Present  . Feeling of Stress : Not at all  Social Connections:   . Frequency of Communication with Friends and Family:   . Frequency of Social Gatherings with Friends and Family:   . Attends Religious Services:   . Active Member of Clubs or Organizations:   . Attends Archivist Meetings:   Marland Kitchen Marital Status:   Intimate Partner Violence: Not At Risk  . Fear of Current or Ex-Partner: No  . Emotionally Abused: No  . Physically Abused: No  . Sexually Abused: No      PHYSICAL EXAM  Vitals:   01/08/20 1033  BP: 128/73  Pulse: 84  Temp: 98.4 F (36.9 C)  Weight: 207 lb (93.9 kg)  Height: 6' (1.829 m)   Body mass index is  28.07 kg/m.  Generalized: Well developed, in no acute distress  Cardiology: normal rate and rhythm, no murmur noted Respiratory: clear to auscultation bilaterally  Neurological examination  Mentation: Alert oriented to time, place, history taking. Follows all commands speech and language fluent Cranial nerve II-XII: Pupils were equal round reactive to light. Extraocular movements were full, visual field were full on confrontational test. Facial sensation and strength were normal. Uvula tongue midline. Head turning and shoulder shrug  were normal and symmetric. Motor: The motor testing reveals 5 over 5 strength of all 4 extremities. Good symmetric motor tone is noted throughout.  Sensory: Sensory testing is intact to soft touch on all 4 extremities. No evidence of extinction is noted.  Coordination: Cerebellar testing reveals good  finger-nose-finger and heel-to-shin bilaterally.  Gait and station: Gait is normal.   DIAGNOSTIC DATA (LABS, IMAGING, TESTING) - I reviewed patient records, labs, notes, testing and imaging myself where available.  MMSE - Mini Mental State Exam 03/28/2019  Orientation to time 5  Orientation to Place 5  Registration 3  Attention/ Calculation 5  Recall 3  Language- name 2 objects 0  Language- repeat 1  Language- follow 3 step command 0  Language- read & follow direction 0  Write a sentence 0  Copy design 0  Total score 22     Lab Results  Component Value Date   WBC 5.9 03/28/2019   HGB 14.5 03/28/2019   HCT 44.1 03/28/2019   MCV 93.4 03/28/2019   PLT 208.0 03/28/2019      Component Value Date/Time   NA 138 03/28/2019 1141   NA 141 02/03/2019 1146   K 4.4 03/28/2019 1141   CL 103 03/28/2019 1141   CO2 28 03/28/2019 1141   GLUCOSE 85 03/28/2019 1141   BUN 14 03/28/2019 1141   BUN 12 02/03/2019 1146   CREATININE 0.81 03/28/2019 1141   CALCIUM 9.5 03/28/2019 1141   PROT 7.3 03/28/2019 1141   PROT 7.7 02/03/2019 1146   ALBUMIN 4.5 03/28/2019  1141   ALBUMIN 4.6 02/03/2019 1146   AST 13 03/28/2019 1141   ALT 13 03/28/2019 1141   ALKPHOS 67 03/28/2019 1141   BILITOT 0.5 03/28/2019 1141   BILITOT 0.3 02/03/2019 1146   GFRNONAA 112 02/03/2019 1146   GFRAA 129 02/03/2019 1146   Lab Results  Component Value Date   CHOL 196 03/28/2019   HDL 36.00 (L) 03/28/2019   LDLCALC 130 (H) 03/28/2019   TRIG 154.0 (H) 03/28/2019   CHOLHDL 5 03/28/2019   No results found for: HGBA1C No results found for: VITAMINB12 Lab Results  Component Value Date   TSH 0.92 03/28/2019     ASSESSMENT AND PLAN 49 y.o. year old male  has a past medical history of Cerebral palsy ([place]), Complex partial seizure disorder ([place]) (05/78), Diverticulitis (10/2014), Diverticulitis of intestine with perforation (10/14/2014), Diverticulosis (10/2014), Intra-abdominal abscess ([place]), and Mild developmental delay. here with     ICD-10-CM   1. Partial symptomatic epilepsy with complex partial seizures, intractable, without status epilepticus ([place])  G40.219     Edward Adkins is doing very well today.  He continues carbamazepine 200 mg 3 times daily and is tolerating well.  Last seizure in 1991.  He lives alone, independent in ADLs and working at [employer].  He is followed closely by primary care.  Carbamazepine levels were normal in June 2020.  CPE labs reviewed for today's visit and are unremarkable.  He plans to see primary care next month.  He was encouraged to stay well-hydrated.  Well-balanced diet and regular exercise advised.  He will return to see me in 1 year, sooner if needed.  He verbalizes understanding and agreement with this plan.   No orders of the defined types were placed in this encounter.    Meds ordered this encounter  Medications  . carbamazepine (TEGRETOL) 200 MG tablet    Sig: Take 1 tablet (200 mg total) by mouth 3 (three) times daily.    Dispense:  270 tablet    Refill:  3    Order Specific Question:   Supervising Provider    Answer:   Melvenia Beam [id number]      I spent 15 minutes with the patient. 50% of this time  was spent counseling and educating patient on plan of care and medications.    Debbora Presto, FNP-C 01/08/2020, 11:24 AM [facility] [address] [place], [place] [address] [phone]

## 2020-01-08 NOTE — Patient Instructions (Signed)
We will continue carbamazepine 200mg  three times daily  Continue close follow up with PCP for CPE and labs.   Follow up in 1 year   Seizure, Adult A seizure is a sudden burst of abnormal electrical activity in the brain. Seizures usually last from 30 seconds to 2 minutes. They can cause many different symptoms. Usually, seizures are not harmful unless they last a long time. What are the causes? Common causes of this condition include:  Fever or infection.  Conditions that affect the brain, such as: ? A brain abnormality that you were born with. ? A brain or head injury. ? Bleeding in the brain. ? A tumor. ? Stroke. ? Brain disorders such as autism or cerebral palsy.  Low blood sugar.  Conditions that are passed from parent to child (are inherited).  Problems with substances, such as: ? Having a reaction to a drug or a medicine. ? Suddenly stopping the use of a substance (withdrawal). In some cases, the cause may not be known. A person who has repeated seizures over time without a clear cause has a condition called epilepsy. What increases the risk? You are more likely to get this condition if you have:  A family history of epilepsy.  Had a seizure in the past.  A brain disorder.  A history of head injury, lack of oxygen at birth, or strokes. What are the signs or symptoms? There are many types of seizures. The symptoms vary depending on the type of seizure you have. Examples of symptoms during a seizure include:  Shaking (convulsions).  Stiffness in the body.  Passing out (losing consciousness).  Head nodding.  Staring.  Not responding to sound or touch.  Loss of bladder control and bowel control. Some people have symptoms right before and right after a seizure happens. Symptoms before a seizure may include:  Fear.  Worry (anxiety).  Feeling like you may vomit (nauseous).  Feeling like the room is spinning (vertigo).  Feeling like you saw or heard  something before (dj vu).  Odd tastes or smells.  Changes in how you see. You may see flashing lights or spots. Symptoms after a seizure happens can include:  Confusion.  Sleepiness.  Headache.  Weakness on one side of the body. How is this treated? Most seizures will stop on their own in under 5 minutes. In these cases, no treatment is needed. Seizures that last longer than 5 minutes will usually need treatment. Treatment can include:  Medicines given through an IV tube.  Avoiding things that are known to cause your seizures. These can include medicines that you take for another condition.  Medicines to treat epilepsy.  Surgery to stop the seizures. This may be needed if medicines do not help. Follow these instructions at home: Medicines  Take over-the-counter and prescription medicines only as told by your doctor.  Do not eat or drink anything that may keep your medicine from working, such as alcohol. Activity  Do not do any activities that would be dangerous if you had another seizure, like driving or swimming. Wait until your doctor says it is safe for you to do them.  If you live in the [place] ask your local DMV (department of motor vehicles) when you can drive.  Get plenty of rest. Teaching others Teach friends and family what to do when you have a seizure. They should:  Lay you on the ground.  Protect your head and body.  Loosen any tight clothing around your neck.  Turn you on your side.  Not hold you down.  Not put anything into your mouth.  Know whether or not you need emergency care.  Stay with you until you are better.  General instructions  Contact your doctor each time you have a seizure.  Avoid anything that gives you seizures.  Keep a seizure diary. Write down: ? What you think caused each seizure. ? What you remember about each seizure.  Keep all follow-up visits as told by your doctor. This is important. Contact a doctor if:  You  have another seizure.  You have seizures more often.  There is any change in what happens during your seizures.  You keep having seizures with treatment.  You have symptoms of being sick or having an infection. Get help right away if:  You have a seizure that: ? Lasts longer than 5 minutes. ? Is different than seizures you had before. ? Makes it harder to breathe. ? Happens after you hurt your head.  You have any of these symptoms after a seizure: ? Not being able to speak. ? Not being able to use a part of your body. ? Confusion. ? A bad headache.  You have two or more seizures in a row.  You do not wake up right after a seizure.  You get hurt during a seizure. These symptoms may be an emergency. Do not wait to see if the symptoms will go away. Get medical help right away. Call your local emergency services (911 in the [place] Do not drive yourself to the hospital. Summary  Seizures usually last from 30 seconds to 2 minutes. Usually, they are not harmful unless they last a long time.  Do not eat or drink anything that may keep your medicine from working, such as alcohol.  Teach friends and family what to do when you have a seizure.  Contact your doctor each time you have a seizure. This information is not intended to replace advice given to you by your health care provider. Make sure you discuss any questions you have with your health care provider. Document Revised: 11/08/2018 Document Reviewed: 11/08/2018 Elsevier Patient Education  [place].

## 2020-02-05 NOTE — Progress Notes (Signed)
I reviewed note and agree with plan.   Penni Bombard, MD [id number], [id number] PM Certified in Neurology, Neurophysiology and Neuroimaging  [facility] [address], [place], [place] [address] [phone]

## 2020-03-01 ENCOUNTER — Other Ambulatory Visit: Payer: Self-pay | Admitting: Family Medicine

## 2020-03-22 ENCOUNTER — Telehealth: Payer: Self-pay | Admitting: Family Medicine

## 2020-03-22 NOTE — Telephone Encounter (Signed)
LVM for patient to RTN my call to R/S appt with nha.

## 2020-03-30 ENCOUNTER — Other Ambulatory Visit: Payer: Self-pay | Admitting: Family Medicine

## 2020-03-30 ENCOUNTER — Ambulatory Visit: Payer: Medicare Other

## 2020-03-30 ENCOUNTER — Other Ambulatory Visit: Payer: Self-pay

## 2020-03-30 ENCOUNTER — Other Ambulatory Visit (INDEPENDENT_AMBULATORY_CARE_PROVIDER_SITE_OTHER): Payer: Medicare Other

## 2020-03-30 DIAGNOSIS — G40309 Generalized idiopathic epilepsy and epileptic syndromes, not intractable, without status epilepticus: Secondary | ICD-10-CM | POA: Diagnosis not present

## 2020-03-30 DIAGNOSIS — E785 Hyperlipidemia, unspecified: Secondary | ICD-10-CM

## 2020-03-30 DIAGNOSIS — Z5181 Encounter for therapeutic drug level monitoring: Secondary | ICD-10-CM

## 2020-03-30 DIAGNOSIS — E039 Hypothyroidism, unspecified: Secondary | ICD-10-CM

## 2020-03-30 LAB — COMPREHENSIVE METABOLIC PANEL
ALT: 13 U/L (ref 0–53)
AST: 13 U/L (ref 0–37)
Albumin: 4.3 g/dL (ref 3.5–5.2)
Alkaline Phosphatase: 64 U/L (ref 39–117)
BUN: 19 mg/dL (ref 6–23)
CO2: 28 mEq/L (ref 19–32)
Calcium: 9.3 mg/dL (ref 8.4–10.5)
Chloride: 107 mEq/L (ref 96–112)
Creatinine, Ser: 0.79 mg/dL (ref 0.40–1.50)
GFR: 104.93 mL/min (ref >60.00)
Glucose, Bld: 88 mg/dL (ref 70–99)
Potassium: 4 mEq/L (ref 3.5–5.1)
Sodium: 139 mEq/L (ref 135–145)
Total Bilirubin: 0.3 mg/dL (ref 0.2–1.2)
Total Protein: 7.2 g/dL (ref 6.0–8.3)

## 2020-03-30 LAB — CBC WITH DIFFERENTIAL/PLATELET
Basophils Absolute: 0.1 10*3/uL (ref 0.0–0.1)
Basophils Relative: 1 % (ref 0.0–3.0)
Eosinophils Absolute: 0.2 10*3/uL (ref 0.0–0.7)
Eosinophils Relative: 4.1 % (ref 0.0–5.0)
HCT: 42.3 % (ref 39.0–52.0)
Hemoglobin: 14.2 g/dL (ref 13.0–17.0)
Lymphocytes Relative: 29.7 % (ref 12.0–46.0)
Lymphs Abs: 1.8 10*3/uL (ref 0.7–4.0)
MCHC: 33.6 g/dL (ref 30.0–36.0)
MCV: 92 fl (ref 78.0–100.0)
Monocytes Absolute: 0.5 10*3/uL (ref 0.1–1.0)
Monocytes Relative: 8.5 % (ref 3.0–12.0)
Neutro Abs: 3.5 10*3/uL (ref 1.4–7.7)
Neutrophils Relative %: 56.7 % (ref 43.0–77.0)
Platelets: 175 10*3/uL (ref 150.0–400.0)
RBC: 4.6 Mil/uL (ref 4.22–5.81)
RDW: 13 % (ref 11.5–15.5)
WBC: 6.1 10*3/uL (ref 4.0–10.5)

## 2020-03-30 LAB — LIPID PANEL
Cholesterol: 199 mg/dL (ref 0–200)
HDL: 34.8 mg/dL — ABNORMAL LOW (ref >39.00)
LDL Cholesterol: 137 mg/dL — ABNORMAL HIGH (ref 0–99)
NonHDL: 164.13
Total CHOL/HDL Ratio: 6
Triglycerides: 134 mg/dL (ref 0.0–149.0)
VLDL: 26.8 mg/dL (ref 0.0–40.0)

## 2020-03-30 LAB — TSH: TSH: 1.16 u[IU]/mL (ref 0.35–4.50)

## 2020-04-01 ENCOUNTER — Other Ambulatory Visit: Payer: Self-pay

## 2020-04-01 ENCOUNTER — Encounter: Payer: Self-pay | Admitting: Family Medicine

## 2020-04-01 ENCOUNTER — Ambulatory Visit (INDEPENDENT_AMBULATORY_CARE_PROVIDER_SITE_OTHER): Payer: Medicare Other | Admitting: Family Medicine

## 2020-04-01 VITALS — BP 118/78 | HR 71 | Temp 97.9°F | Ht 70.25 in | Wt 201.1 lb

## 2020-04-01 DIAGNOSIS — Z23 Encounter for immunization: Secondary | ICD-10-CM | POA: Diagnosis not present

## 2020-04-01 DIAGNOSIS — Z0000 Encounter for general adult medical examination without abnormal findings: Secondary | ICD-10-CM

## 2020-04-01 DIAGNOSIS — E039 Hypothyroidism, unspecified: Secondary | ICD-10-CM | POA: Diagnosis not present

## 2020-04-01 DIAGNOSIS — E785 Hyperlipidemia, unspecified: Secondary | ICD-10-CM

## 2020-04-01 DIAGNOSIS — R6250 Unspecified lack of expected normal physiological development in childhood: Secondary | ICD-10-CM | POA: Diagnosis not present

## 2020-04-01 DIAGNOSIS — H6122 Impacted cerumen, left ear: Secondary | ICD-10-CM

## 2020-04-01 DIAGNOSIS — G40209 Localization-related (focal) (partial) symptomatic epilepsy and epileptic syndromes with complex partial seizures, not intractable, without status epilepticus: Secondary | ICD-10-CM | POA: Diagnosis not present

## 2020-04-01 DIAGNOSIS — G809 Cerebral palsy, unspecified: Secondary | ICD-10-CM

## 2020-04-01 DIAGNOSIS — Z7189 Other specified counseling: Secondary | ICD-10-CM

## 2020-04-01 MED ORDER — LEVOTHYROXINE SODIUM 50 MCG PO TABS: 50.0000 ug | ORAL_TABLET | Freq: Every day | ORAL | 3 refills | Status: DC

## 2020-04-01 NOTE — Assessment & Plan Note (Signed)
Stable period - appreciate neuro care. Continue carbamazepine TID.

## 2020-04-01 NOTE — Assessment & Plan Note (Signed)
Stable period. Living alone after father's passing.

## 2020-04-01 NOTE — Assessment & Plan Note (Addendum)
Advanced directive discussion -has not set up. Would want uncle or cousin in [place] to be [facility] proxy. Packet provided today.

## 2020-04-01 NOTE — Progress Notes (Signed)
This visit was conducted in person.  BP 118/78 (BP Location: Left Arm, Patient Position: Sitting, Cuff Size: Normal)   Pulse 71   Temp 97.9 F (36.6 C) (Temporal)   Ht 5' 10.25" (1.784 m)   Wt 201 lb 2 oz (91.2 kg)   SpO2 99%   BMI 28.65 kg/m    CC: AMW Subjective:    Patient ID: Edward Adkins, male    DOB: December 12, 1970, 49 y.o.   MRN: [id number]  HPI: Edward Adkins is a 49 y.o. male presenting on 04/01/2020 for Medicare Wellness   Did not see health advisor this year.    Hearing Screening   125Hz  250Hz  500Hz  1000Hz  2000Hz  3000Hz  4000Hz  6000Hz  8000Hz   Right ear:   40 40 40  40    Left ear:   0 0 40  0      Visual Acuity Screening   Right eye Left eye Both eyes  Without correction: 20/25 20/20 20/25   With correction:     Denies trouble    Office Visit from 04/01/2020 in [place] at [facility] Total Score 0      Fall Risk  04/01/2020 03/28/2019 03/21/2018 03/19/2017 03/13/2016  Falls in the past year? 0 0 No No No  Risk for fall due to : - Medication side effect - - -  Follow up Falls evaluation completed Falls evaluation completed;Falls prevention discussed - - -   H/o cerebral palsy with mild developmental delay on carbamazepine for h/o seizures followed by [facility]. Working on weight loss - 6 lbs down.  Father passed away Jul 28, 2019.   Preventative: Colonoscopy (2016) - 2 TA, 2 HP, diverticulosis, rpt 5 yrs Pamalee Leyden) Prostate cancer screening - not due yet. +fmhx Lung cancer screening - not indicated  Flu shotyearly  COVID - discussed, recommended Tetanus shot -unsure Pneumonia shot - not due yet Shingles shot - not due yet Advanced directive discussion -has not set up. Would want uncle or cousin in [place] to be [facility] proxy. Packet provided today.  Seat belt use discussed  Sunscreen use discussed, no changing moles on skin  Non smoker  Alcohol - none - avoids due to seizure meds Dentist Q6 mo  Eye exam - has not seen    Patient is single. Father passed away July 28, 2019.  Patient drinks two caffeine drinks daily (sodas).  Patient is ambi-dextrous.  KCL:EXNTZGY has a high school education. 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  Activity: plays golf, mows yard.  Diet: drinking more water.fruits/vegetables daily     Relevant past medical, surgical, family and social history reviewed and updated as indicated. Interim medical history since our last visit reviewed. Allergies and medications reviewed and updated. Outpatient Medications Prior to Visit  Medication Sig Dispense Refill  . carbamazepine (TEGRETOL) 200 MG tablet Take 1 tablet (200 mg total) by mouth 3 (three) times daily. 270 tablet 3  . Multiple Vitamin (MULTIVITAMIN) tablet Take 2 tablets by mouth daily.     Marland Kitchen levothyroxine (SYNTHROID) 50 MCG tablet TAKE 1 TABLET(50 MCG) BY MOUTH AT BEDTIME 90 tablet 0   No facility-administered medications prior to visit.     Per HPI unless specifically indicated in ROS section below Review of Systems  Constitutional: Negative for activity change, appetite change, chills, fatigue, fever and unexpected weight change.  HENT: Negative for hearing loss.   Eyes: Negative for visual disturbance.  Respiratory: Negative for cough, chest tightness, shortness of breath and wheezing.  Cardiovascular: Negative for chest pain, palpitations and leg swelling.  Gastrointestinal: Negative for abdominal distention, abdominal pain, blood in stool, constipation, diarrhea, nausea and vomiting.  Genitourinary: Negative for difficulty urinating and hematuria.  Musculoskeletal: Negative for arthralgias, myalgias and neck pain.  Skin: Negative for rash.  Neurological: Negative for dizziness, seizures, syncope and headaches.  Hematological: Negative for adenopathy. Does not bruise/bleed easily.  Psychiatric/Behavioral: Negative for dysphoric mood. The patient is not nervous/anxious.    Objective:  BP 118/78 (BP Location: Left  Arm, Patient Position: Sitting, Cuff Size: Normal)   Pulse 71   Temp 97.9 F (36.6 C) (Temporal)   Ht 5' 10.25" (1.784 m)   Wt 201 lb 2 oz (91.2 kg)   SpO2 99%   BMI 28.65 kg/m   Wt Readings from Last 3 Encounters:  04/01/20 201 lb 2 oz (91.2 kg)  01/08/20 207 lb (93.9 kg)  04/01/19 197 lb 7 oz (89.6 kg)      Physical Exam Vitals and nursing note reviewed.  Constitutional:      General: He is not in acute distress.    Appearance: Normal appearance. He is well-developed. He is not ill-appearing.  HENT:     Head: Normocephalic and atraumatic.     Right Ear: Hearing, tympanic membrane, ear canal and external ear normal.     Left Ear: Hearing and external ear normal. There is impacted cerumen.     Ears:     Comments: Patient did not tolerate cerumen disimpaction attempt with plastic curette Eyes:     General: No scleral icterus.    Extraocular Movements: Extraocular movements intact.     Conjunctiva/sclera: Conjunctivae normal.     Pupils: Pupils are equal, round, and reactive to light.  Cardiovascular:     Rate and Rhythm: Normal rate and regular rhythm.     Pulses: Normal pulses.          Radial pulses are 2+ on the right side and 2+ on the left side.     Heart sounds: Normal heart sounds. No murmur heard.   Pulmonary:     Effort: Pulmonary effort is normal. No respiratory distress.     Breath sounds: Normal breath sounds. No wheezing, rhonchi or rales.  Abdominal:     General: Abdomen is flat. Bowel sounds are normal. There is no distension.     Palpations: Abdomen is soft. There is no mass.     Tenderness: There is no abdominal tenderness. There is no guarding or rebound.     Hernia: No hernia is present.  Musculoskeletal:        General: Normal range of motion.     Cervical back: Normal range of motion and neck supple.     Right lower leg: No edema.     Left lower leg: No edema.  Lymphadenopathy:     Cervical: No cervical adenopathy.  Skin:    General: Skin is  warm and dry.     Findings: No rash.  Neurological:     General: No focal deficit present.     Mental Status: He is alert and oriented to person, place, and time.     Comments:  CN grossly intact, station and gait intact Recall 3/3 Calculation 3/5 DLORW  Psychiatric:        Mood and Affect: Mood normal.        Behavior: Behavior normal.        Thought Content: Thought content normal.        Judgment:  Judgment normal.       Results for orders placed or performed in visit on 03/30/20  CBC with Differential/Platelet  Result Value Ref Range   WBC 6.1 4.0 - 10.5 K/uL   RBC 4.60 4.22 - 5.81 Mil/uL   Hemoglobin 14.2 13.0 - 17.0 g/dL   HCT 42.3 39 - 52 %   MCV 92.0 78.0 - 100.0 fl   MCHC 33.6 30.0 - 36.0 g/dL   RDW 13.0 11.5 - 15.5 %   Platelets 175.0 150 - 400 K/uL   Neutrophils Relative % 56.7 43 - 77 %   Lymphocytes Relative 29.7 12 - 46 %   Monocytes Relative 8.5 3 - 12 %   Eosinophils Relative 4.1 0 - 5 %   Basophils Relative 1.0 0 - 3 %   Neutro Abs 3.5 1.4 - 7.7 K/uL   Lymphs Abs 1.8 0.7 - 4.0 K/uL   Monocytes Absolute 0.5 0 - 1 K/uL   Eosinophils Absolute 0.2 0 - 0 K/uL   Basophils Absolute 0.1 0 - 0 K/uL  TSH  Result Value Ref Range   TSH 1.16 0.35 - 4.50 uIU/mL  Comprehensive metabolic panel  Result Value Ref Range   Sodium 139 135 - 145 mEq/L   Potassium 4.0 3.5 - 5.1 mEq/L   Chloride 107 96 - 112 mEq/L   CO2 28 19 - 32 mEq/L   Glucose, Bld 88 70 - 99 mg/dL   BUN 19 6 - 23 mg/dL   Creatinine, Ser 0.79 0.40 - 1.50 mg/dL   Total Bilirubin 0.3 0.2 - 1.2 mg/dL   Alkaline Phosphatase 64 39 - 117 U/L   AST 13 0 - 37 U/L   ALT 13 0 - 53 U/L   Total Protein 7.2 6.0 - 8.3 g/dL   Albumin 4.3 3.5 - 5.2 g/dL   GFR 104.93 >60.00 mL/min   Calcium 9.3 8.4 - 10.5 mg/dL  Lipid panel  Result Value Ref Range   Cholesterol 199 0 - 200 mg/dL   Triglycerides 134.0 0 - 149 mg/dL   HDL 34.80 (L) >39.00 mg/dL   VLDL 26.8 0.0 - 40.0 mg/dL   LDL Cholesterol 137 (H) 0 - 99  mg/dL   Total CHOL/HDL Ratio 6    NonHDL 164.13    Assessment & Plan:  This visit occurred during the SARS-CoV-2 public health emergency.  Safety protocols were in place, including screening questions prior to the visit, additional usage of staff PPE, and extensive cleaning of exam room while observing appropriate contact time as indicated for disinfecting solutions.   Problem List Items Addressed This Visit    Mild developmental delay    Living alone, no trouble with ADLs, manages his finances.  Uncle lives in [place], cousin lives in [place].       Medicare annual wellness visit, subsequent - Primary    I have personally reviewed the Medicare Annual Wellness questionnaire and have noted 1. The patient's medical and social history 2. Their use of alcohol, tobacco or illicit drugs 3. Their current medications and supplements 4. The patient's functional ability including ADL's, fall risks, home safety risks and hearing or visual impairment. Cognitive function has been assessed and addressed as indicated.  5. Diet and physical activity 6. Evidence for depression or mood disorders The patients weight, height, BMI have been recorded in the chart. I have made referrals, counseling and provided education to the patient based on review of the above and I have provided the pt  with a written personalized care plan for preventive services. Provider list updated.. See scanned questionairre as needed for further documentation. Reviewed preventative protocols and updated unless pt declined.       Hearing loss of left ear due to cerumen impaction    Did not tolerate disimpaction attempt. Will refer to ENT.       Relevant Orders   Ambulatory referral to ENT   Dyslipidemia    Chronic, off meds. Encouraged healthy diet and lifestyle choices to help improve control The 10-year ASCVD risk score [name] [name]., et al., 2013) is: 3.3%   Values used to calculate the score:     Age: 53 years      Sex: Male     Is Non-Hispanic African American: No     Diabetic: No     Tobacco smoker: No     Systolic Blood Pressure: 060 mmHg     Is BP treated: No     HDL Cholesterol: 34.8 mg/dL     Total Cholesterol: 199 mg/dL       Complex partial seizures (HCC)    Stable period - appreciate neuro care. Continue carbamazepine TID.       Cerebral palsy (HCC)    Stable period. Living alone after father's passing.       Advanced care planning/counseling discussion    Advanced directive discussion -has not set up. Would want uncle or cousin in [place] to be [facility] proxy. Packet provided today.       Acquired hypothyroidism    Chronic, stable. Continue current regimen.       Relevant Medications   levothyroxine (SYNTHROID) 50 MCG tablet    Other Visit Diagnoses    Need for Tdap vaccination       Relevant Orders   Tdap vaccine greater than or equal to 7yo IM (Completed)       Meds ordered this encounter  Medications  . levothyroxine (SYNTHROID) 50 MCG tablet    Sig: Take 1 tablet (50 mcg total) by mouth daily before breakfast.    Dispense:  90 tablet    Refill:  3   Orders Placed This Encounter  Procedures  . Tdap vaccine greater than or equal to 7yo IM  . Ambulatory referral to ENT    Referral Priority:   Routine    Referral Type:   Consultation    Referral Reason:   Specialty Services Required    Requested Specialty:   Otolaryngology    Number of Visits Requested:   1    Patient instructions: I do recommend getting the COVID shot at your local pharmacy.  Tdap today.  You are doing well today Return as needed or in 1 year for next wellness visit  I will refer you to ENT (ear doctor) for wax removal on left side.   Follow up plan: Return in about 1 year (around 04/01/2021) for medicare wellness visit.  [name], MD

## 2020-04-01 NOTE — Assessment & Plan Note (Addendum)
Living alone, no trouble with ADLs, manages his finances.  Uncle lives in [place], cousin lives in [place].

## 2020-04-01 NOTE — Assessment & Plan Note (Signed)

## 2020-04-01 NOTE — Assessment & Plan Note (Signed)
Chronic, off meds. Encouraged healthy diet and lifestyle choices to help improve control The 10-year ASCVD risk score [name] [name]., et al., 2013) is: 3.3%   Values used to calculate the score:     Age: 49 years     Sex: Male     Is Non-Hispanic African American: No     Diabetic: No     Tobacco smoker: No     Systolic Blood Pressure: 116 mmHg     Is BP treated: No     HDL Cholesterol: 34.8 mg/dL     Total Cholesterol: 199 mg/dL

## 2020-04-01 NOTE — Assessment & Plan Note (Signed)
Did not tolerate disimpaction attempt. Will refer to ENT.

## 2020-04-01 NOTE — Assessment & Plan Note (Signed)
Chronic, stable. Continue current regimen. 

## 2020-04-01 NOTE — Patient Instructions (Addendum)
I do recommend getting the COVID shot at your local pharmacy.  Tdap today.  You are doing well today Return as needed or in 1 year for next wellness visit  I will refer you to ENT (ear doctor) for wax removal on left side.   Health Maintenance, Male Adopting a healthy lifestyle and getting preventive care are important in promoting health and wellness. Ask your health care provider about:  The right schedule for you to have regular tests and exams.  Things you can do on your own to prevent diseases and keep yourself healthy. What should I know about diet, weight, and exercise? Eat a healthy diet   Eat a diet that includes plenty of vegetables, fruits, low-fat dairy products, and lean protein.  Do not eat a lot of foods that are high in solid fats, added sugars, or sodium. Maintain a healthy weight Body mass index (BMI) is a measurement that can be used to identify possible weight problems. It estimates body fat based on height and weight. Your health care provider can help determine your BMI and help you achieve or maintain a healthy weight. Get regular exercise Get regular exercise. This is one of the most important things you can do for your health. Most adults should:  Exercise for at least 150 minutes each week. The exercise should increase your heart rate and make you sweat (moderate-intensity exercise).  Do strengthening exercises at least twice a week. This is in addition to the moderate-intensity exercise.  Spend less time sitting. Even light physical activity can be beneficial. Watch cholesterol and blood lipids Have your blood tested for lipids and cholesterol at 47 years of age, then have this test every 5 years. You may need to have your cholesterol levels checked more often if:  Your lipid or cholesterol levels are high.  You are older than 47 years of age.  You are at high risk for heart disease. What should I know about cancer screening? Many types of cancers can  be detected early and may often be prevented. Depending on your health history and family history, you may need to have cancer screening at various ages. This may include screening for:  Colorectal cancer.  Prostate cancer.  Skin cancer.  Lung cancer. What should I know about heart disease, diabetes, and high blood pressure? Blood pressure and heart disease  High blood pressure causes heart disease and increases the risk of stroke. This is more likely to develop in people who have high blood pressure readings, are of African descent, or are overweight.  Talk with your health care provider about your target blood pressure readings.  Have your blood pressure checked: ? Every 3-5 years if you are 67-78 years of age. ? Every year if you are 79 years old or older.  If you are between the ages of 26 and 34 and are a current or former smoker, ask your health care provider if you should have a one-time screening for abdominal aortic aneurysm (AAA). Diabetes Have regular diabetes screenings. This checks your fasting blood sugar level. Have the screening done:  Once every three years after age 60 if you are at a normal weight and have a low risk for diabetes.  More often and at a younger age if you are overweight or have a high risk for diabetes. What should I know about preventing infection? Hepatitis B If you have a higher risk for hepatitis B, you should be screened for this virus. Talk with your health  care provider to find out if you are at risk for hepatitis B infection. Hepatitis C Blood testing is recommended for:  Everyone born from [birth year] through 1963.  Anyone with known risk factors for hepatitis C. Sexually transmitted infections (STIs)  You should be screened each year for STIs, including gonorrhea and chlamydia, if: ? You are sexually active and are younger than 47 years of age. ? You are older than 47 years of age and your health care provider tells you that you are at risk  for this type of infection. ? Your sexual activity has changed since you were last screened, and you are at increased risk for chlamydia or gonorrhea. Ask your health care provider if you are at risk.  Ask your health care provider about whether you are at high risk for HIV. Your health care provider may recommend a prescription medicine to help prevent HIV infection. If you choose to take medicine to prevent HIV, you should first get tested for HIV. You should then be tested every 3 months for as long as you are taking the medicine. Follow these instructions at home: Lifestyle  Do not use any products that contain nicotine or tobacco, such as cigarettes, e-cigarettes, and chewing tobacco. If you need help quitting, ask your health care provider.  Do not use street drugs.  Do not share needles.  Ask your health care provider for help if you need support or information about quitting drugs. Alcohol use  Do not drink alcohol if your health care provider tells you not to drink.  If you drink alcohol: ? Limit how much you have to 0-2 drinks a day. ? Be aware of how much alcohol is in your drink. In the [place] one drink equals one 12 oz bottle of beer (355 mL), one 5 oz glass of wine (148 mL), or one 1 oz glass of hard liquor (44 mL). General instructions  Schedule regular health, dental, and eye exams.  Stay current with your vaccines.  Tell your health care provider if: ? You often feel depressed. ? You have ever been abused or do not feel safe at home. Summary  Adopting a healthy lifestyle and getting preventive care are important in promoting health and wellness.  Follow your health care provider's instructions about healthy diet, exercising, and getting tested or screened for diseases.  Follow your health care provider's instructions on monitoring your cholesterol and blood pressure. This information is not intended to replace advice given to you by your health care provider.  Make sure you discuss any questions you have with your health care provider. Document Revised: 08/14/2018 Document Reviewed: 08/14/2018 Elsevier Patient Education  2020 [employer].

## 2020-04-10 DIAGNOSIS — Z23 Encounter for immunization: Secondary | ICD-10-CM | POA: Diagnosis not present

## 2020-05-12 DIAGNOSIS — Z23 Encounter for immunization: Secondary | ICD-10-CM | POA: Diagnosis not present

## 2021-01-10 ENCOUNTER — Ambulatory Visit: Payer: Medicare Other | Admitting: Family Medicine

## 2021-01-10 NOTE — Patient Instructions (Incomplete)
Below is our plan:  We will continue carbamazepine 200mg  three times daily  Please make sure you are staying well hydrated. I recommend 50-60 ounces daily. Well balanced diet and regular exercise encouraged. Consistent sleep schedule with 6-8 hours recommended.   Please continue follow up with care team as directed.   Follow up with me in 1 year   You may receive a survey regarding today's visit. I encourage you to leave honest feed back as I do use this information to improve patient care. Thank you for seeing me today!      Seizure, Adult A seizure is a sudden burst of abnormal electrical and chemical activity in the brain. Seizures usually last from 30 seconds to 2 minutes.  What are the causes? Common causes of this condition include:  Fever or infection.  Problems that affect the brain. These may include: ? A brain or head injury. ? Bleeding in the brain. ? A brain tumor.  Low levels of blood sugar or salt.  Kidney problems or liver problems.  Conditions that are passed from parent to child (are inherited).  Problems with a substance, such as: ? Having a reaction to a drug or a medicine. ? Stopping the use of a substance all of a sudden (withdrawal).  A stroke.  Disorders that affect how you develop. Sometimes, the cause may not be known.  What increases the risk?  Having someone in your family who has epilepsy. In this condition, seizures happen again and again over time. They have no clear cause.  Having had a tonic-clonic seizure before. This type of seizure causes you to: ? Tighten the muscles of the whole body. ? Lose consciousness.  Having had a head injury or strokes before.  Having had a lack of oxygen at birth. What are the signs or symptoms? There are many types of seizures. The symptoms vary depending on the type of seizure you have. Symptoms during a seizure  Shaking that you cannot control (convulsions) with fast, jerky movements of  muscles.  Stiffness of the body.  Breathing problems.  Feeling mixed up (confused).  Staring or not responding to sound or touch.  Head nodding.  Eyes that blink, flutter, or move fast.  Drooling, grunting, or making clicking sounds with your mouth  Losing control of when you pee or poop. Symptoms before a seizure  Feeling afraid, nervous, or worried.  Feeling like you may vomit.  Feeling like: ? You are moving when you are not. ? Things around you are moving when they are not.  Feeling like you saw or heard something before (dj vu).  Odd tastes or smells.  Changes in how you see. You may see flashing lights or spots. Symptoms after a seizure  Feeling confused.  Feeling sleepy.  Headache.  Sore muscles. How is this treated? If your seizure stops on its own, you will not need treatment. If your seizure lasts longer than 5 minutes, you will normally need treatment. Treatment may include:  Medicines given through an IV tube.  Avoiding things, such as medicines, that are known to cause your seizures.  Medicines to prevent seizures.  A device to prevent or control seizures.  Surgery.  A diet low in carbohydrates and high in fat (ketogenic diet). Follow these instructions at home: Medicines  Take over-the-counter and prescription medicines only as told by your doctor.  Avoid foods or drinks that may keep your medicine from working, such as alcohol. Activity  Follow instructions about driving,  swimming, or doing things that would be dangerous if you had another seizure. Wait until your doctor says it is safe for you to do these things.  If you live in the [place] ask your local department of motor vehicles when you can drive.  Get a lot of rest. Teaching others  Teach friends and family what to do when you have a seizure. They should: ? Help you get down to the ground. ? Protect your head and body. ? Loosen any clothing around your neck. ? Turn you  on your side. ? Know whether or not you need emergency care. ? Stay with you until you are better.  Also, tell them what not to do if you have a seizure. Tell them: ? They should not hold you down. ? They should not put anything in your mouth.   General instructions  Avoid anything that gives you seizures.  Keep a seizure diary. Write down: ? What you remember about each seizure. ? What you think caused each seizure.  Keep all follow-up visits. Contact a doctor if:  You have another seizure or seizures. Call the doctor each time you have a seizure.  The pattern of your seizures changes.  You keep having seizures with treatment.  You have symptoms of being sick or having an infection.  You are not able to take your medicine. Get help right away if:  You have any of these problems: ? A seizure that lasts longer than 5 minutes. ? Many seizures in a row and you do not feel better between seizures. ? A seizure that makes it harder to breathe. ? A seizure and you can no longer speak or use part of your body.  You do not wake up right after a seizure.  You get hurt during a seizure.  You feel confused or have pain right after a seizure. These symptoms may be an emergency. Get help right away. Call your local emergency services (911 in the [place]  Do not wait to see if the symptoms will go away.  Do not drive yourself to the hospital. Summary  A seizure is a sudden burst of abnormal electrical and chemical activity in the brain. Seizures normally last from 30 seconds to 2 minutes.  Causes of seizures include illness, injury to the head, low levels of blood sugar or salt, and certain conditions.  Most seizures will stop on their own in less than 5 minutes. Seizures that last longer than 5 minutes are a medical emergency and need treatment right away.  Many medicines are used to treat seizures. Take over-the-counter and prescription medicines only as told by your  doctor. This information is not intended to replace advice given to you by your health care provider. Make sure you discuss any questions you have with your health care provider. Document Revised: 02/27/2020 Document Reviewed: 02/27/2020 Elsevier Patient Education  [place].

## 2021-01-10 NOTE — Progress Notes (Deleted)
PATIENT: Edward Adkins DOB: 03-Feb-1971  REASON FOR VISIT: follow up HISTORY FROM: patient  No chief complaint on file.    HISTORY OF PRESENT ILLNESS: 01/10/21 ALL:  Edward Adkins returns for follow up for seizures. He continues carbamazepine 200mg  TID and is tolerating well. He is followed regularly by PCP. Labs 03/2020 unremarkable.    01/08/2020 ALL:  Edward Adkins is a 50 y.o. male here today for follow up for seizures. He is doing very well. He continues carbamazepine 200mg  TID. No seizures. He is tolerating meds well. He works part time at [employer]. He drives without difficulty. He lives alone.   HISTORY: (copied from my note on 01/08/2019)  Edward Adkins is a 50 y.o. male for follow up of seizure. He reports doing well on carbamazepine 200 mg 3 times daily.  He is tolerating medication well with no obvious adverse effects.  He denies any seizure activity.  Last seizure in September 1991.   HISTORY (copied from [facility] note on 01/03/2018)  Update 5/2/2019[name],50 year old male returns for follow-up with history of seizure disorder complex partial with secondary generalization. He is currently on carbamazepine 200 mg 1 tablet 3 times daily. He denies any side effects to the medication. He denies any falls or balance issues. Hislast seizure was in September 1991.He continues to work at [employer]. He exercises by playing golf. He has a history of cerebral palsy mild mental retardation as well he returns for reevaluation.No new medical issues. He returns for reevaluation  UPDATE 08/10/16 ([facility]): Since last visit, doing well. No sz. Last seizure was Sept 1991.Continues to work at [employer]. He takes CBZ 200mg  TID (5am, 6pm, 11pm).  PRIOR HPI(CM): Patient has history ofcerebral palsy, mild mental retardation and seizure disorder. He has been followed at [facility], onset of seizure disorder at age 71. 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 He was originally on phenobarbital and developed a rash, switched to Dilantin with recurrent seizures. Initiated on carbamazepine in 1991without further seizure activity since that time. Denies any side effects to his medications, any daytime drowsiness, feelings of being off balance, no missed doses of his medication, and no falls.    REVIEW OF SYSTEMS: Out of a complete 14 system review of symptoms, the patient complains only of the following symptoms, seizures and all other reviewed systems are negative.  ALLERGIES: Allergies  Allergen Reactions  . Phenobarbital     REACTION: rash    HOME MEDICATIONS: Outpatient Medications Prior to Visit  Medication Sig Dispense Refill  . carbamazepine (TEGRETOL) 200 MG tablet Take 1 tablet (200 mg total) by mouth 3 (three) times daily. 270 tablet 3  . levothyroxine (SYNTHROID) 50 MCG tablet Take 1 tablet (50 mcg total) by mouth daily before breakfast. 90 tablet 3  . Multiple Vitamin (MULTIVITAMIN) tablet Take 2 tablets by mouth daily.      No facility-administered medications prior to visit.    PAST MEDICAL HISTORY: Past Medical History:  Diagnosis Date  . Cerebral palsy ([place])   . Complex partial seizure disorder ([place]) 05/78   Head CT's multiple-WNL, EEG R temporal lobe discharge ([facility])  . Diverticulitis 01/3663   complicated by -itis, intra abd abscess, and perforation  . Diverticulitis of intestine with perforation 10/14/2014  . Diverticulosis 12/345   complicated by -itis, intra abd abscess, and perforation  . Intra-abdominal abscess ([place])   .  Mild developmental delay     PAST SURGICAL HISTORY: Past Surgical History:  Procedure Laterality Date  . COLONOSCOPY  2016   2 TA, 2 HP, diverticulosis, rpt 5 yrs Production assistant, radio)    FAMILY HISTORY: Family History  Problem Relation Age of Onset  . Cancer Mother 58       Leukemia  . Cancer Father 87        Prostate ca  . CAD Father   . Diabetes Father   . Thyroid disease Father     SOCIAL HISTORY: Social History   Socioeconomic History  . Marital status: Single    Spouse name: Not on file  . Number of children: 0  . Years of education: 58  . Highest education level: Not on file  Occupational History    Employer: [employer]  Tobacco Use  . Smoking status: Never Smoker  . Smokeless tobacco: Never Used  Vaping Use  . Vaping Use: Never used  Substance and Sexual Activity  . Alcohol use: No    Alcohol/week: 0.0 standard drinks  . Drug use: No  . Sexual activity: Not Currently  Other Topics Concern  . Not on file  Social History Narrative   Patient is single and lives with his father.   Patient drinks two caffeine drinks daily.   Patient is ambi-dextrous.   Patient has a high school education.   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            Social Determinants of Health   Financial Resource Strain: Not on file  Food Insecurity: Not on file  Transportation Needs: Not on file  Physical Activity: Not on file  Stress: Not on file  Social Connections: Not on file  Intimate Partner Violence: Not on file      PHYSICAL EXAM  There were no vitals filed for this visit. There is no height or weight on file to calculate BMI.  Generalized: Well developed, in no acute distress  Cardiology: normal rate and rhythm, no murmur noted Respiratory: clear to auscultation bilaterally  Neurological examination  Mentation: Alert oriented to time, place, history taking. Follows all commands speech and language fluent Cranial nerve II-XII: Pupils were equal round reactive to light. Extraocular movements were full, visual field were full on confrontational test. Facial sensation and strength were normal. Uvula tongue midline. Head turning and shoulder shrug  were normal and symmetric. Motor: The motor testing reveals 5 over 5 strength of all 4 extremities. Good symmetric motor  tone is noted throughout.  Sensory: Sensory testing is intact to soft touch on all 4 extremities. No evidence of extinction is noted.  Coordination: Cerebellar testing reveals good finger-nose-finger and heel-to-shin bilaterally.  Gait and station: Gait is normal.   DIAGNOSTIC DATA (LABS, IMAGING, TESTING) - I reviewed patient records, labs, notes, testing and imaging myself where available.  MMSE - Mini Mental State Exam 03/28/2019  Orientation to time 5  Orientation to Place 5  Registration 3  Attention/ Calculation 5  Recall 3  Language- name 2 objects 0  Language- repeat 1  Language- follow 3 step command 0  Language- read & follow direction 0  Write a sentence 0  Copy design 0  Total score 22     Lab Results  Component Value Date   WBC 6.1 03/30/2020   HGB 14.2 03/30/2020   HCT 42.3 03/30/2020   MCV 92.0 03/30/2020   PLT 175.0 03/30/2020      Component Value Date/Time  NA 139 03/30/2020 1446   NA 141 02/03/2019 1146   K 4.0 03/30/2020 1446   CL 107 03/30/2020 1446   CO2 28 03/30/2020 1446   GLUCOSE 88 03/30/2020 1446   BUN 19 03/30/2020 1446   BUN 12 02/03/2019 1146   CREATININE 0.79 03/30/2020 1446   CALCIUM 9.3 03/30/2020 1446   PROT 7.2 03/30/2020 1446   PROT 7.7 02/03/2019 1146   ALBUMIN 4.3 03/30/2020 1446   ALBUMIN 4.6 02/03/2019 1146   AST 13 03/30/2020 1446   ALT 13 03/30/2020 1446   ALKPHOS 64 03/30/2020 1446   BILITOT 0.3 03/30/2020 1446   BILITOT 0.3 02/03/2019 1146   GFRNONAA 112 02/03/2019 1146   GFRAA 129 02/03/2019 1146   Lab Results  Component Value Date   CHOL 199 03/30/2020   HDL 34.80 (L) 03/30/2020   LDLCALC 137 (H) 03/30/2020   TRIG 134.0 03/30/2020   CHOLHDL 6 03/30/2020   No results found for: HGBA1C No results found for: VITAMINB12 Lab Results  Component Value Date   TSH 1.16 03/30/2020     ASSESSMENT AND PLAN 50 y.o. year old male  has a past medical history of Cerebral palsy ([place]), Complex partial seizure disorder  ([place]) (05/78), Diverticulitis (10/2014), Diverticulitis of intestine with perforation (10/14/2014), Diverticulosis (10/2014), Intra-abdominal abscess ([place]), and Mild developmental delay. here with   No diagnosis found.   Pascal is doing very well today.  He continues carbamazepine 200 mg 3 times daily and is tolerating well.  Last seizure in 1991.  He lives alone, independent in ADLs and working at [employer].  He is followed closely by primary care.  Carbamazepine levels were normal in June 2020.  CPE labs reviewed for today's visit and are unremarkable.  He plans to see primary care next month.  He was encouraged to stay well-hydrated.  Well-balanced diet and regular exercise advised.  He will return to see me in 1 year, sooner if needed.  He verbalizes understanding and agreement with this plan.   No orders of the defined types were placed in this encounter.    No orders of the defined types were placed in this encounter.      Debbora Presto, FNP-C 01/10/2021, 7:20 AM [facility] [address], [place], [place] [address] [phone]

## 2021-01-11 NOTE — Progress Notes (Signed)
PATIENT: Edward Adkins DOB: [birth year]  REASON FOR VISIT: follow up HISTORY FROM: patient  Chief Complaint  Patient presents with  . Follow-up    RM 2, alone, pt reports being stable w/ no SZ like activity.      HISTORY OF PRESENT ILLNESS: 01/12/21 ALL:  Edward Adkins returns for follow up for seizures. He continues carbamazepine 200mg  TID and is tolerating well. He is followed regularly by PCP. Labs 03/2020 unremarkable. He continues to live alone. He works full time. He is driving without difficulty.    01/08/2020 ALL:  Edward Adkins is a 50 y.o. male here today for follow up for seizures. He is doing very well. He continues carbamazepine 200mg  TID. No seizures. He is tolerating meds well. He works part time at [employer]. He drives without difficulty. He lives alone.   HISTORY: (copied from my note on 01/08/2019)  Edward Adkins is a 50 y.o. male for follow up of seizure. He reports doing well on carbamazepine 200 mg 3 times daily.  He is tolerating medication well with no obvious adverse effects.  He denies any seizure activity.  Last seizure in September 1991.   HISTORY (copied from [facility] note on 01/03/2018)  Update 5/2/2019CMMr.Pontius,50 year old male returns for follow-up with history of seizure disorder complex partial with secondary generalization. He is currently on carbamazepine 200 mg 1 tablet 3 times daily. He denies any side effects to the medication. He denies any falls or balance issues. Hislast seizure was in September 1991.He continues to work at [employer]. He exercises by playing golf. He has a history of cerebral palsy mild mental retardation as well he returns for reevaluation.No new medical issues. He returns for reevaluation  UPDATE 08/10/16 ([facility]P): Since last visit, doing well. No sz. Last seizure was Sept 1991.Continues to work at [employer]. He takes CBZ 200mg  TID (5am, 6pm, 11pm).  PRIOR HPI(CM): Patient has history ofcerebral palsy,  mild mental retardation and seizure disorder. He has been followed at [facility] since 1984, onset of seizure disorder at age 79. 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 He was originally on phenobarbital and developed a rash, switched to Dilantin with recurrent seizures. Initiated on carbamazepine in 1991without further seizure activity since that time. Denies any side effects to his medications, any daytime drowsiness, feelings of being off balance, no missed doses of his medication, and no falls.    REVIEW OF SYSTEMS: Out of a complete 14 system review of symptoms, the patient complains only of the following symptoms, seizures and all other reviewed systems are negative.  ALLERGIES: Allergies  Allergen Reactions  . Phenobarbital     REACTION: rash    HOME MEDICATIONS: Outpatient Medications Prior to Visit  Medication Sig Dispense Refill  . levothyroxine (SYNTHROID) 50 MCG tablet Take 1 tablet (50 mcg total) by mouth daily before breakfast. 90 tablet 3  . Multiple Vitamin (MULTIVITAMIN) tablet Take 2 tablets by mouth daily.    . carbamazepine (TEGRETOL) 200 MG tablet Take 1 tablet (200 mg total) by mouth 3 (three) times daily. 270 tablet 3   No facility-administered medications prior to visit.    PAST MEDICAL HISTORY: Past Medical History:  Diagnosis Date  . Cerebral palsy ([place])   . Complex partial seizure disorder ([place]) 05/78   Head CT's multiple-WNL, EEG R temporal lobe discharge ([facility])  . Diverticulitis 01/4007   complicated by -  itis, intra abd abscess, and perforation  . Diverticulitis of intestine with perforation 10/14/2014  . Diverticulosis 12/1935   complicated by -itis, intra abd abscess, and perforation  . Intra-abdominal abscess ([place])   . Mild developmental delay     PAST SURGICAL HISTORY: Past Surgical History:  Procedure Laterality Date  . COLONOSCOPY  2016   2 TA, 2  HP, diverticulosis, rpt 5 yrs Production assistant, radio)    FAMILY HISTORY: Family History  Problem Relation Age of Onset  . Cancer Mother 76       Leukemia  . Cancer Father 92       Prostate ca  . CAD Father   . Diabetes Father   . Thyroid disease Father     SOCIAL HISTORY: Social History   Socioeconomic History  . Marital status: Single    Spouse name: Not on file  . Number of children: 0  . Years of education: 51  . Highest education level: Not on file  Occupational History    Employer: [employer]  Tobacco Use  . Smoking status: Never Smoker  . Smokeless tobacco: Never Used  Vaping Use  . Vaping Use: Never used  Substance and Sexual Activity  . Alcohol use: No    Alcohol/week: 0.0 standard drinks  . Drug use: No  . Sexual activity: Not Currently  Other Topics Concern  . Not on file  Social History Narrative   Patient is single and lives with his father.   Patient drinks two caffeine drinks daily.   Patient is ambi-dextrous.   Patient has a high school education.   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            Social Determinants of Health   Financial Resource Strain: Not on file  Food Insecurity: Not on file  Transportation Needs: Not on file  Physical Activity: Not on file  Stress: Not on file  Social Connections: Not on file  Intimate Partner Violence: Not on file      PHYSICAL EXAM  Vitals:   01/12/21 0907  BP: 120/79  Pulse: 79  Weight: 199 lb (90.3 kg)  Height: 5' 10.25" (1.784 m)   Body mass index is 28.35 kg/m.  Generalized: Well developed, in no acute distress  Cardiology: normal rate and rhythm, no murmur noted Respiratory: clear to auscultation bilaterally  Neurological examination  Mentation: Alert oriented to time, place, history taking. Follows all commands speech and language fluent Cranial nerve II-XII: Pupils were equal round reactive to light. Extraocular movements were full, visual field were full on confrontational  test. Facial sensation and strength were normal. Head turning and shoulder shrug  were normal and symmetric. Motor: The motor testing reveals 5 over 5 strength of all 4 extremities. Good symmetric motor tone is noted throughout.  Sensory: Sensory testing is intact to soft touch on all 4 extremities. No evidence of extinction is noted.  Coordination: Cerebellar testing reveals good finger-nose-finger and heel-to-shin bilaterally.  Gait and station: Gait is normal.   DIAGNOSTIC DATA (LABS, IMAGING, TESTING) - I reviewed patient records, labs, notes, testing and imaging myself where available.  MMSE - Mini Mental State Exam 03/28/2019  Orientation to time 5  Orientation to Place 5  Registration 3  Attention/ Calculation 5  Recall 3  Language- name 2 objects 0  Language- repeat 1  Language- follow 3 step command 0  Language- read & follow direction 0  Write a sentence 0  Copy design 0  Total score  22     Lab Results  Component Value Date   WBC 6.1 03/30/2020   HGB 14.2 03/30/2020   HCT 42.3 03/30/2020   MCV 92.0 03/30/2020   PLT 175.0 03/30/2020      Component Value Date/Time   NA 139 03/30/2020 1446   NA 141 02/03/2019 1146   K 4.0 03/30/2020 1446   CL 107 03/30/2020 1446   CO2 28 03/30/2020 1446   GLUCOSE 88 03/30/2020 1446   BUN 19 03/30/2020 1446   BUN 12 02/03/2019 1146   CREATININE 0.79 03/30/2020 1446   CALCIUM 9.3 03/30/2020 1446   PROT 7.2 03/30/2020 1446   PROT 7.7 02/03/2019 1146   ALBUMIN 4.3 03/30/2020 1446   ALBUMIN 4.6 02/03/2019 1146   AST 13 03/30/2020 1446   ALT 13 03/30/2020 1446   ALKPHOS 64 03/30/2020 1446   BILITOT 0.3 03/30/2020 1446   BILITOT 0.3 02/03/2019 1146   GFRNONAA 112 02/03/2019 1146   GFRAA 129 02/03/2019 1146   Lab Results  Component Value Date   CHOL 199 03/30/2020   HDL 34.80 (L) 03/30/2020   LDLCALC 137 (H) 03/30/2020   TRIG 134.0 03/30/2020   CHOLHDL 6 03/30/2020   No results found for: HGBA1C No results found for:  VITAMINB12 Lab Results  Component Value Date   TSH 1.16 03/30/2020     ASSESSMENT AND PLAN 50 y.o. year old male  has a past medical history of Cerebral palsy ([place]), Complex partial seizure disorder ([place]) (05/78), Diverticulitis (10/2014), Diverticulitis of intestine with perforation (10/14/2014), Diverticulosis (10/2014), Intra-abdominal abscess ([place]), and Mild developmental delay. here with     ICD-10-CM   1. Partial symptomatic epilepsy with complex partial seizures, intractable, without status epilepticus ([place])  G40.219 Carbamazepine level, total     Alexius is doing very well today.  He continues carbamazepine 200 mg 3 times daily and is tolerating well.  Last seizure in 1991.  He lives alone, independent in ADLs and working full time at [employer].  He is followed closely by primary care. CPE labs reviewed for today's visit and are unremarkable. We will update carbamazepine level, today.  He was encouraged to stay well-hydrated.  Well-balanced diet and regular exercise advised.  He will return to see me in 1 year, sooner if needed.  He verbalizes understanding and agreement with this plan.   Orders Placed This Encounter  Procedures  . Carbamazepine level, total     Meds ordered this encounter  Medications  . carbamazepine (TEGRETOL) 200 MG tablet    Sig: Take 1 tablet (200 mg total) by mouth 3 (three) times daily.    Dispense:  270 tablet    Refill:  3    Order Specific Question:   Supervising Provider    Answer:   Bess Harvest, FNP-C 01/12/2021, 9:40 AM [facility] [address], [place], [place] [address] [phone]

## 2021-01-11 NOTE — Patient Instructions (Signed)
Below is our plan:  We will continue carbamazepine 200mg  three times daily.   Please make sure you are staying well hydrated. I recommend 50-60 ounces daily. Well balanced diet and regular exercise encouraged. Consistent sleep schedule with 6-8 hours recommended.   Please continue follow up with care team as directed.   Follow up with me in 1 year   You may receive a survey regarding today's visit. I encourage you to leave honest feed back as I do use this information to improve patient care. Thank you for seeing me today!      Seizure, Adult A seizure is a sudden burst of abnormal electrical and chemical activity in the brain. Seizures usually last from 30 seconds to 2 minutes.  What are the causes? Common causes of this condition include:  Fever or infection.  Problems that affect the brain. These may include: ? A brain or head injury. ? Bleeding in the brain. ? A brain tumor.  Low levels of blood sugar or salt.  Kidney problems or liver problems.  Conditions that are passed from parent to child (are inherited).  Problems with a substance, such as: ? Having a reaction to a drug or a medicine. ? Stopping the use of a substance all of a sudden (withdrawal).  A stroke.  Disorders that affect how you develop. Sometimes, the cause may not be known.  What increases the risk?  Having someone in your family who has epilepsy. In this condition, seizures happen again and again over time. They have no clear cause.  Having had a tonic-clonic seizure before. This type of seizure causes you to: ? Tighten the muscles of the whole body. ? Lose consciousness.  Having had a head injury or strokes before.  Having had a lack of oxygen at birth. What are the signs or symptoms? There are many types of seizures. The symptoms vary depending on the type of seizure you have. Symptoms during a seizure  Shaking that you cannot control (convulsions) with fast, jerky movements of  muscles.  Stiffness of the body.  Breathing problems.  Feeling mixed up (confused).  Staring or not responding to sound or touch.  Head nodding.  Eyes that blink, flutter, or move fast.  Drooling, grunting, or making clicking sounds with your mouth  Losing control of when you pee or poop. Symptoms before a seizure  Feeling afraid, nervous, or worried.  Feeling like you may vomit.  Feeling like: ? You are moving when you are not. ? Things around you are moving when they are not.  Feeling like you saw or heard something before (dj vu).  Odd tastes or smells.  Changes in how you see. You may see flashing lights or spots. Symptoms after a seizure  Feeling confused.  Feeling sleepy.  Headache.  Sore muscles. How is this treated? If your seizure stops on its own, you will not need treatment. If your seizure lasts longer than 5 minutes, you will normally need treatment. Treatment may include:  Medicines given through an IV tube.  Avoiding things, such as medicines, that are known to cause your seizures.  Medicines to prevent seizures.  A device to prevent or control seizures.  Surgery.  A diet low in carbohydrates and high in fat (ketogenic diet). Follow these instructions at home: Medicines  Take over-the-counter and prescription medicines only as told by your doctor.  Avoid foods or drinks that may keep your medicine from working, such as alcohol. Activity  Follow instructions about  driving, swimming, or doing things that would be dangerous if you had another seizure. Wait until your doctor says it is safe for you to do these things.  If you live in the [place] ask your local department of motor vehicles when you can drive.  Get a lot of rest. Teaching others  Teach friends and family what to do when you have a seizure. They should: ? Help you get down to the ground. ? Protect your head and body. ? Loosen any clothing around your neck. ? Turn you  on your side. ? Know whether or not you need emergency care. ? Stay with you until you are better.  Also, tell them what not to do if you have a seizure. Tell them: ? They should not hold you down. ? They should not put anything in your mouth.   General instructions  Avoid anything that gives you seizures.  Keep a seizure diary. Write down: ? What you remember about each seizure. ? What you think caused each seizure.  Keep all follow-up visits. Contact a doctor if:  You have another seizure or seizures. Call the doctor each time you have a seizure.  The pattern of your seizures changes.  You keep having seizures with treatment.  You have symptoms of being sick or having an infection.  You are not able to take your medicine. Get help right away if:  You have any of these problems: ? A seizure that lasts longer than 5 minutes. ? Many seizures in a row and you do not feel better between seizures. ? A seizure that makes it harder to breathe. ? A seizure and you can no longer speak or use part of your body.  You do not wake up right after a seizure.  You get hurt during a seizure.  You feel confused or have pain right after a seizure. These symptoms may be an emergency. Get help right away. Call your local emergency services (911 in the [place]  Do not wait to see if the symptoms will go away.  Do not drive yourself to the hospital. Summary  A seizure is a sudden burst of abnormal electrical and chemical activity in the brain. Seizures normally last from 30 seconds to 2 minutes.  Causes of seizures include illness, injury to the head, low levels of blood sugar or salt, and certain conditions.  Most seizures will stop on their own in less than 5 minutes. Seizures that last longer than 5 minutes are a medical emergency and need treatment right away.  Many medicines are used to treat seizures. Take over-the-counter and prescription medicines only as told by your  doctor. This information is not intended to replace advice given to you by your health care provider. Make sure you discuss any questions you have with your health care provider. Document Revised: 02/27/2020 Document Reviewed: 02/27/2020 Elsevier Patient Education  [place].

## 2021-01-12 ENCOUNTER — Encounter: Payer: Self-pay | Admitting: Family Medicine

## 2021-01-12 ENCOUNTER — Ambulatory Visit (INDEPENDENT_AMBULATORY_CARE_PROVIDER_SITE_OTHER): Payer: Medicare Other | Admitting: Family Medicine

## 2021-01-12 VITALS — BP 120/79 | HR 79 | Ht 70.25 in | Wt 199.0 lb

## 2021-01-12 DIAGNOSIS — G40219 Localization-related (focal) (partial) symptomatic epilepsy and epileptic syndromes with complex partial seizures, intractable, without status epilepticus: Secondary | ICD-10-CM | POA: Diagnosis not present

## 2021-01-12 MED ORDER — CARBAMAZEPINE 200 MG PO TABS: 200.0000 mg | ORAL_TABLET | Freq: Three times a day (TID) | ORAL | 3 refills | Status: DC

## 2021-01-13 ENCOUNTER — Telehealth: Payer: Self-pay | Admitting: *Deleted

## 2021-01-13 DIAGNOSIS — G40219 Localization-related (focal) (partial) symptomatic epilepsy and epileptic syndromes with complex partial seizures, intractable, without status epilepticus: Secondary | ICD-10-CM

## 2021-01-13 LAB — CARBAMAZEPINE LEVEL, TOTAL: Carbamazepine (Tegretol), S: 12.1 ug/mL — ABNORMAL HIGH (ref 4.0–12.0)

## 2021-01-13 NOTE — Telephone Encounter (Signed)
-----   Message from Debbora Presto, NP sent at 01/13/2021 10:35 AM EDT ----- Let him know that his carbamazepine level was right at the upper limit of normal. I am not overly worried about this but would like to repeat in a few months. If PCP will order and send [place] results that would be great. He has follow up soon. If not, I am happy to check here in 3 months.

## 2021-01-13 NOTE — Telephone Encounter (Signed)
LMVm for pt to return call for lab results.  Need trough in AM lab (takes TID).

## 2021-01-17 ENCOUNTER — Telehealth: Payer: Self-pay | Admitting: *Deleted

## 2021-01-17 NOTE — Telephone Encounter (Signed)
Spoke to pt.  See other phone note. 

## 2021-01-17 NOTE — Telephone Encounter (Signed)
Pt returned phone call, would like a call back.  

## 2021-01-17 NOTE — Telephone Encounter (Signed)
Called and spoke to patient.  Relayed the results is carbamazepine level was on the high end of normal.  Amy NP would like to repeat in 3 months.  I relayed to patient he could come here at our office or family doctor.  He said he could come here, I placed order, and gave him the dates and times of lab.  He will come at 8:00 (bring his med with him) for trough level.  He did not have any questions.

## 2021-01-27 NOTE — Progress Notes (Signed)
I reviewed note and agree with plan.   Penni Bombard, MD 2/63/3354, 56:25 AM Certified in Neurology, Neurophysiology and Neuroimaging  [facility] [address], [place], [place] [address] [phone]

## 2021-03-20 ENCOUNTER — Other Ambulatory Visit: Payer: Self-pay | Admitting: Family Medicine

## 2021-03-20 DIAGNOSIS — G40209 Localization-related (focal) (partial) symptomatic epilepsy and epileptic syndromes with complex partial seizures, not intractable, without status epilepticus: Secondary | ICD-10-CM

## 2021-03-20 DIAGNOSIS — E039 Hypothyroidism, unspecified: Secondary | ICD-10-CM

## 2021-03-20 DIAGNOSIS — E785 Hyperlipidemia, unspecified: Secondary | ICD-10-CM

## 2021-03-23 ENCOUNTER — Other Ambulatory Visit (INDEPENDENT_AMBULATORY_CARE_PROVIDER_SITE_OTHER): Payer: Medicare Other

## 2021-03-23 ENCOUNTER — Other Ambulatory Visit: Payer: Self-pay

## 2021-03-23 DIAGNOSIS — E039 Hypothyroidism, unspecified: Secondary | ICD-10-CM

## 2021-03-23 DIAGNOSIS — G40209 Localization-related (focal) (partial) symptomatic epilepsy and epileptic syndromes with complex partial seizures, not intractable, without status epilepticus: Secondary | ICD-10-CM

## 2021-03-23 DIAGNOSIS — E785 Hyperlipidemia, unspecified: Secondary | ICD-10-CM

## 2021-03-23 LAB — COMPREHENSIVE METABOLIC PANEL
ALT: 10 U/L (ref 0–53)
AST: 12 U/L (ref 0–37)
Albumin: 4.1 g/dL (ref 3.5–5.2)
Alkaline Phosphatase: 66 U/L (ref 39–117)
BUN: 14 mg/dL (ref 6–23)
CO2: 27 mEq/L (ref 19–32)
Calcium: 8.7 mg/dL (ref 8.4–10.5)
Chloride: 107 mEq/L (ref 96–112)
Creatinine, Ser: 0.8 mg/dL (ref 0.40–1.50)
GFR: 104.71 mL/min (ref >60.00)
Glucose, Bld: 87 mg/dL (ref 70–99)
Potassium: 4.1 mEq/L (ref 3.5–5.1)
Sodium: 141 mEq/L (ref 135–145)
Total Bilirubin: 0.5 mg/dL (ref 0.2–1.2)
Total Protein: 7 g/dL (ref 6.0–8.3)

## 2021-03-23 LAB — CBC WITH DIFFERENTIAL/PLATELET
Basophils Absolute: 0 10*3/uL (ref 0.0–0.1)
Basophils Relative: 0.5 % (ref 0.0–3.0)
Eosinophils Absolute: 0.2 10*3/uL (ref 0.0–0.7)
Eosinophils Relative: 3.1 % (ref 0.0–5.0)
HCT: 42 % (ref 39.0–52.0)
Hemoglobin: 14.2 g/dL (ref 13.0–17.0)
Lymphocytes Relative: 28.8 % (ref 12.0–46.0)
Lymphs Abs: 2 10*3/uL (ref 0.7–4.0)
MCHC: 33.7 g/dL (ref 30.0–36.0)
MCV: 90.9 fl (ref 78.0–100.0)
Monocytes Absolute: 0.6 10*3/uL (ref 0.1–1.0)
Monocytes Relative: 8.6 % (ref 3.0–12.0)
Neutro Abs: 4.2 10*3/uL (ref 1.4–7.7)
Neutrophils Relative %: 59 % (ref 43.0–77.0)
Platelets: 196 10*3/uL (ref 150.0–400.0)
RBC: 4.62 Mil/uL (ref 4.22–5.81)
RDW: 12.6 % (ref 11.5–15.5)
WBC: 7.1 10*3/uL (ref 4.0–10.5)

## 2021-03-23 LAB — LIPID PANEL
Cholesterol: 188 mg/dL (ref 0–200)
HDL: 37.2 mg/dL — ABNORMAL LOW (ref >39.00)
LDL Cholesterol: 129 mg/dL — ABNORMAL HIGH (ref 0–99)
NonHDL: 150.67
Total CHOL/HDL Ratio: 5
Triglycerides: 106 mg/dL (ref 0.0–149.0)
VLDL: 21.2 mg/dL (ref 0.0–40.0)

## 2021-03-23 LAB — TSH: TSH: 2.26 u[IU]/mL (ref 0.35–5.50)

## 2021-03-24 LAB — CARBAMAZEPINE LEVEL, TOTAL: Carbamazepine Lvl: 4.8 mg/L (ref 4.0–12.0)

## 2021-04-04 ENCOUNTER — Encounter: Payer: Self-pay | Admitting: Family Medicine

## 2021-04-04 ENCOUNTER — Other Ambulatory Visit: Payer: Self-pay

## 2021-04-04 ENCOUNTER — Ambulatory Visit (INDEPENDENT_AMBULATORY_CARE_PROVIDER_SITE_OTHER): Payer: Medicare Other | Admitting: Family Medicine

## 2021-04-04 VITALS — BP 118/70 | HR 86 | Temp 97.4°F | Ht 70.75 in | Wt 198.4 lb

## 2021-04-04 DIAGNOSIS — E785 Hyperlipidemia, unspecified: Secondary | ICD-10-CM

## 2021-04-04 DIAGNOSIS — G809 Cerebral palsy, unspecified: Secondary | ICD-10-CM | POA: Diagnosis not present

## 2021-04-04 DIAGNOSIS — Z0000 Encounter for general adult medical examination without abnormal findings: Secondary | ICD-10-CM

## 2021-04-04 DIAGNOSIS — G40209 Localization-related (focal) (partial) symptomatic epilepsy and epileptic syndromes with complex partial seizures, not intractable, without status epilepticus: Secondary | ICD-10-CM

## 2021-04-04 DIAGNOSIS — E039 Hypothyroidism, unspecified: Secondary | ICD-10-CM

## 2021-04-04 DIAGNOSIS — Z1211 Encounter for screening for malignant neoplasm of colon: Secondary | ICD-10-CM

## 2021-04-04 DIAGNOSIS — R6250 Unspecified lack of expected normal physiological development in childhood: Secondary | ICD-10-CM | POA: Diagnosis not present

## 2021-04-04 DIAGNOSIS — Z7189 Other specified counseling: Secondary | ICD-10-CM | POA: Diagnosis not present

## 2021-04-04 MED ORDER — LEVOTHYROXINE SODIUM 50 MCG PO TABS
50.0000 ug | ORAL_TABLET | Freq: Every day | ORAL | 3 refills | Status: DC
Start: 2021-04-04 — End: 2022-04-05

## 2021-04-04 NOTE — Assessment & Plan Note (Signed)

## 2021-04-04 NOTE — Assessment & Plan Note (Signed)
Advanced directive discussion - has not set up. Would want neighbor Sherrill Raring to be HCPOA. Asked to bring [place] copy.

## 2021-04-04 NOTE — Patient Instructions (Addendum)
You are due for repeat colonoscopy - I will place referral for [facility].  Next time you go to [facility], ask for written dates of COVID shots - and let us know.  Wear sunscreen when outdoors to help protect the skin.  You are doing well today! Return as needed or in 1 year for next wellness visit.   Health Maintenance, Male Adopting a healthy lifestyle and getting preventive care are important in promoting health and wellness. Ask your health care provider about: The right schedule for you to have regular tests and exams. Things you can do on your own to prevent diseases and keep yourself healthy. What should I know about diet, weight, and exercise? Eat a healthy diet  Eat a diet that includes plenty of vegetables, fruits, low-fat dairy products, and lean protein. Do not eat a lot of foods that are high in solid fats, added sugars, or sodium.  Maintain a healthy weight Body mass index (BMI) is a measurement that can be used to identify possible weight problems. It estimates body fat based on height and weight. Your health care provider can help determine your BMI and help you achieve or maintain ahealthy weight. Get regular exercise Get regular exercise. This is one of the most important things you can do for your health. Most adults should: Exercise for at least 150 minutes each week. The exercise should increase your heart rate and make you sweat (moderate-intensity exercise). Do strengthening exercises at least twice a week. This is in addition to the moderate-intensity exercise. Spend less time sitting. Even light physical activity can be beneficial. Watch cholesterol and blood lipids Have your blood tested for lipids and cholesterol at 48 years of age, then havethis test every 5 years. You may need to have your cholesterol levels checked more often if: Your lipid or cholesterol levels are high. You are older than 48 years of age. You are at high risk for heart disease. What should I  know about cancer screening? Many types of cancers can be detected early and may often be prevented. Depending on your health history and family history, you may need to have cancer screening at various ages. This may include screening for: Colorectal cancer. Prostate cancer. Skin cancer. Lung cancer. What should I know about heart disease, diabetes, and high blood pressure? Blood pressure and heart disease High blood pressure causes heart disease and increases the risk of stroke. This is more likely to develop in people who have high blood pressure readings, are of African descent, or are overweight. Talk with your health care provider about your target blood pressure readings. Have your blood pressure checked: Every 3-5 years if you are 65-69 years of age. Every year if you are 30 years old or older. If you are between the ages of 41 and 8 and are a current or former smoker, ask your health care provider if you should have a one-time screening for abdominal aortic aneurysm (AAA). Diabetes Have regular diabetes screenings. This checks your fasting blood sugar level. Have the screening done: Once every three years after age 41 if you are at a normal weight and have a low risk for diabetes. More often and at a younger age if you are overweight or have a high risk for diabetes. What should I know about preventing infection? Hepatitis B If you have a higher risk for hepatitis B, you should be screened for this virus. Talk with your health care provider to find out if you are at  risk forhepatitis B infection. Hepatitis C Blood testing is recommended for: Everyone born from [birth year] through 1963. Anyone with known risk factors for hepatitis C. Sexually transmitted infections (STIs) You should be screened each year for STIs, including gonorrhea and chlamydia, if: You are sexually active and are younger than 48 years of age. You are older than 48 years of age and your health care provider tells you  that you are at risk for this type of infection. Your sexual activity has changed since you were last screened, and you are at increased risk for chlamydia or gonorrhea. Ask your health care provider if you are at risk. Ask your health care provider about whether you are at high risk for HIV. Your health care provider may recommend a prescription medicine to help prevent HIV infection. If you choose to take medicine to prevent HIV, you should first get tested for HIV. You should then be tested every 3 months for as long as you are taking the medicine. Follow these instructions at home: Lifestyle Do not use any products that contain nicotine or tobacco, such as cigarettes, e-cigarettes, and chewing tobacco. If you need help quitting, ask your health care provider. Do not use street drugs. Do not share needles. Ask your health care provider for help if you need support or information about quitting drugs. Alcohol use Do not drink alcohol if your health care provider tells you not to drink. If you drink alcohol: Limit how much you have to 0-2 drinks a day. Be aware of how much alcohol is in your drink. In the [place] one drink equals one 12 oz bottle of beer (355 mL), one 5 oz glass of wine (148 mL), or one 1 oz glass of hard liquor (44 mL). General instructions Schedule regular health, dental, and eye exams. Stay current with your vaccines. Tell your health care provider if: You often feel depressed. You have ever been abused or do not feel safe at home. Summary Adopting a healthy lifestyle and getting preventive care are important in promoting health and wellness. Follow your health care provider's instructions about healthy diet, exercising, and getting tested or screened for diseases. Follow your health care provider's instructions on monitoring your cholesterol and blood pressure. This information is not intended to replace advice given to you by your health care provider. Make sure you  discuss any questions you have with your healthcare provider. Document Revised: 08/14/2018 Document Reviewed: 08/14/2018 Elsevier Patient Education  2022 [employer].

## 2021-04-04 NOTE — Assessment & Plan Note (Signed)
Stable period.  

## 2021-04-04 NOTE — Assessment & Plan Note (Signed)
Stable period followed by neurology - continue tegretol.

## 2021-04-04 NOTE — Assessment & Plan Note (Signed)
Chronic, stable on current levothyroxine regimen.  

## 2021-04-04 NOTE — Progress Notes (Signed)
Patient ID: Edward Adkins, male    DOB: May 13, 1971, 50 y.o.   MRN: [id number]  This visit was conducted in person.  BP 118/70   Pulse 86   Temp (!) 97.4 F (36.3 C) (Temporal)   Ht 5' 10.75" (1.797 m)   Wt 198 lb 6 oz (90 kg)   SpO2 99%   BMI 27.86 kg/m    CC: AMW  Subjective:   HPI: Edward Adkins is a 50 y.o. male presenting on 04/04/2021 for Medicare Wellness   Did not see health advisor.   Hearing Screening   '500Hz'$  '1000Hz'$  '2000Hz'$  '4000Hz'$   Right ear 20 40 40 0  Left ear 40 40 40 0   Vision Screening   Right eye Left eye Both eyes  Without correction '20/25 20/25 20/25 '$  With correction       Flowsheet Row Office Visit from 04/04/2021 in [facility]  PHQ-2 Total Score 0       Fall Risk  04/04/2021 04/01/2020 03/28/2019 03/21/2018 03/19/2017  Falls in the past year? 0 0 0 No No  Risk for fall due to : - - Medication side effect - -  Follow up - Falls evaluation completed Falls evaluation completed;Falls prevention discussed - -    H/o cerebral palsy with mild developmental delay on carbamazepine for h/o seizures followed by [facility].  Father passed away 06/29/2019.   Preventative: Colonoscopy (2016) - 2 TA, 2 HP, diverticulosis, rpt 5 yrs Production assistant, radio). No blood in stool or bowel changes Prostate cancer screening - not due yet. +fmhx Lung cancer screening - not indicated Flu shot yearly  [place] x2 2021 ([facility] in [place])  Tdap 03/2020  Pneumonia shot - not due yet Shingles shot - not due yet Advanced directive discussion - has not set up. Would want neighbor Edward Adkins to be HCPOA. Asked to bring [place] copy.  Seat belt use discussed Sunscreen use discussed, no changing moles on skin.  Non smoker Alcohol - none - avoids due to seizure meds Dentist Q6 mo  Eye exam - has not seen - no trouble with vision   Patient is single. Father passed away 06-29-2019.  Patient drinks two caffeine drinks daily (sodas).  Patient is  ambi-dextrous.  Edu: Patient has a high school education.  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  Activity: plays golf, mows 4 acre yard  Diet: drinking more water/gatorade. fruits/vegetables daily     Relevant past medical, surgical, family and social history reviewed and updated as indicated. Interim medical history since our last visit reviewed. Allergies and medications reviewed and updated. Outpatient Medications Prior to Visit  Medication Sig Dispense Refill   carbamazepine (TEGRETOL) 200 MG tablet Take 1 tablet (200 mg total) by mouth 3 (three) times daily. 270 tablet 3   Multiple Vitamin (MULTIVITAMIN) tablet Take 2 tablets by mouth daily.     levothyroxine (SYNTHROID) 50 MCG tablet Take 1 tablet (50 mcg total) by mouth daily before breakfast. 90 tablet 3   No facility-administered medications prior to visit.     Per HPI unless specifically indicated in ROS section below Review of Systems  Objective:  BP 118/70   Pulse 86   Temp (!) 97.4 F (36.3 C) (Temporal)   Ht 5' 10.75" (1.797 m)   Wt 198 lb 6 oz (90 kg)   SpO2 99%   BMI 27.86 kg/m   Wt Readings from Last 3 Encounters:  04/04/21 198  lb 6 oz (90 kg)  01/12/21 199 lb (90.3 kg)  04/01/20 201 lb 2 oz (91.2 kg)      Physical Exam Vitals and nursing note reviewed.  Constitutional:      General: He is not in acute distress.    Appearance: Normal appearance. He is well-developed. He is not ill-appearing.  HENT:     Head: Normocephalic and atraumatic.     Right Ear: Hearing, tympanic membrane, ear canal and external ear normal.     Left Ear: Hearing, tympanic membrane, ear canal and external ear normal.  Eyes:     General: No scleral icterus.    Extraocular Movements: Extraocular movements intact.     Conjunctiva/sclera: Conjunctivae normal.     Pupils: Pupils are equal, round, and reactive to light.  Neck:     Thyroid: No thyroid mass or thyromegaly.  Cardiovascular:     Rate and Rhythm: Normal rate and  regular rhythm.     Pulses: Normal pulses.          Radial pulses are 2+ on the right side and 2+ on the left side.     Heart sounds: Normal heart sounds. No murmur heard. Pulmonary:     Effort: Pulmonary effort is normal. No respiratory distress.     Breath sounds: Normal breath sounds. No wheezing, rhonchi or rales.  Abdominal:     General: Bowel sounds are normal. There is no distension.     Palpations: Abdomen is soft. There is no mass.     Tenderness: There is no abdominal tenderness. There is no guarding or rebound.     Hernia: No hernia is present.  Musculoskeletal:        General: Normal range of motion.     Cervical back: Normal range of motion and neck supple.     Right lower leg: No edema.     Left lower leg: No edema.  Lymphadenopathy:     Cervical: No cervical adenopathy.  Skin:    General: Skin is warm and dry.     Findings: No rash.  Neurological:     General: No focal deficit present.     Mental Status: He is alert and oriented to person, place, and time.  Psychiatric:        Mood and Affect: Mood normal.        Behavior: Behavior normal.        Thought Content: Thought content normal.        Judgment: Judgment normal.      Results for orders placed or performed in visit on 03/23/21  Carbamazepine level, total  Result Value Ref Range   Carbamazepine Lvl 4.8 4.0 - 12.0 mg/L  CBC with Differential/Platelet  Result Value Ref Range   WBC 7.1 4.0 - 10.5 K/uL   RBC 4.62 4.22 - 5.81 Mil/uL   Hemoglobin 14.2 13.0 - 17.0 g/dL   HCT 42.0 39.0 - 52.0 %   MCV 90.9 78.0 - 100.0 fl   MCHC 33.7 30.0 - 36.0 g/dL   RDW 12.6 11.5 - 15.5 %   Platelets 196.0 150.0 - 400.0 K/uL   Neutrophils Relative % 59.0 43.0 - 77.0 %   Lymphocytes Relative 28.8 12.0 - 46.0 %   Monocytes Relative 8.6 3.0 - 12.0 %   Eosinophils Relative 3.1 0.0 - 5.0 %   Basophils Relative 0.5 0.0 - 3.0 %   Neutro Abs 4.2 1.4 - 7.7 K/uL   Lymphs Abs 2.0 0.7 - 4.0 K/uL  Monocytes Absolute 0.6 0.1 -  1.0 K/uL   Eosinophils Absolute 0.2 0.0 - 0.7 K/uL   Basophils Absolute 0.0 0.0 - 0.1 K/uL  TSH  Result Value Ref Range   TSH 2.26 0.35 - 5.50 uIU/mL  Comprehensive metabolic panel  Result Value Ref Range   Sodium 141 135 - 145 mEq/L   Potassium 4.1 3.5 - 5.1 mEq/L   Chloride 107 96 - 112 mEq/L   CO2 27 19 - 32 mEq/L   Glucose, Bld 87 70 - 99 mg/dL   BUN 14 6 - 23 mg/dL   Creatinine, Ser 0.80 0.40 - 1.50 mg/dL   Total Bilirubin 0.5 0.2 - 1.2 mg/dL   Alkaline Phosphatase 66 39 - 117 U/L   AST 12 0 - 37 U/L   ALT 10 0 - 53 U/L   Total Protein 7.0 6.0 - 8.3 g/dL   Albumin 4.1 3.5 - 5.2 g/dL   GFR 104.71 >60.00 mL/min   Calcium 8.7 8.4 - 10.5 mg/dL  Lipid panel  Result Value Ref Range   Cholesterol 188 0 - 200 mg/dL   Triglycerides 106.0 0.0 - 149.0 mg/dL   HDL 37.20 (L) >39.00 mg/dL   VLDL 21.2 0.0 - 40.0 mg/dL   LDL Cholesterol 129 (H) 0 - 99 mg/dL   Total CHOL/HDL Ratio 5    NonHDL 150.67     Assessment & Plan:  This visit occurred during the SARS-CoV-2 public health emergency.  Safety protocols were in place, including screening questions prior to the visit, additional usage of staff PPE, and extensive cleaning of exam room while observing appropriate contact time as indicated for disinfecting solutions.   Problem List Items Addressed This Visit     Complex partial seizures ([place])    Stable period followed by neurology - continue tegretol.        Dyslipidemia    Chronic off meds, improving. The 10-year ASCVD risk score [name] [name]., et al., 2013) is: 3.1%   Values used to calculate the score:     Age: 65 years     Sex: Male     Is Non-Hispanic African American: No     Diabetic: No     Tobacco smoker: No     Systolic Blood Pressure: 123456 mmHg     Is BP treated: No     HDL Cholesterol: 37.2 mg/dL     Total Cholesterol: 188 mg/dL        Cerebral palsy (HCC)    Stable period.       Medicare annual wellness visit, subsequent - Primary    I have personally  reviewed the Medicare Annual Wellness questionnaire and have noted 1. The patient's medical and social history 2. Their use of alcohol, tobacco or illicit drugs 3. Their current medications and supplements 4. The patient's functional ability including ADL's, fall risks, home safety risks and hearing or visual impairment. Cognitive function has been assessed and addressed as indicated.  5. Diet and physical activity 6. Evidence for depression or mood disorders The patients weight, height, BMI have been recorded in the chart. I have made referrals, counseling and provided education to the patient based on review of the above and I have provided the pt with a written personalized care plan for preventive services. Provider list updated.. See scanned questionairre as needed for further documentation. Reviewed preventative protocols and updated unless pt declined.       Advanced care planning/counseling discussion    Advanced directive discussion - has  not set up. Would want neighbor Edward Adkins to be HCPOA. Asked to bring [place] copy.        Mild developmental delay   Acquired hypothyroidism    Chronic, stable on current levothyroxine regimen.       Relevant Medications   levothyroxine (SYNTHROID) 50 MCG tablet   Other Visit Diagnoses     Special screening for malignant neoplasms, colon       Relevant Orders   Ambulatory referral to Gastroenterology        Meds ordered this encounter  Medications   levothyroxine (SYNTHROID) 50 MCG tablet    Sig: Take 1 tablet (50 mcg total) by mouth daily before breakfast.    Dispense:  90 tablet    Refill:  3   Orders Placed This Encounter  Procedures   Ambulatory referral to Gastroenterology    Referral Priority:   Routine    Referral Type:   Consultation    Referral Reason:   Specialty Services Required    Number of Visits Requested:   1    Patient instructions: You are due for repeat colonoscopy - I will place referral for [facility].   Next time you go to [facility], ask for written dates of COVID shots - and let us know.  Wear sunscreen when outdoors to help protect the skin.  You are doing well today! Return as needed or in 1 year for next wellness visit.   Follow up plan: Return in about 1 year (around 04/04/2022), or if symptoms worsen or fail to improve, for medicare wellness visit.  [name], MD

## 2021-04-04 NOTE — Assessment & Plan Note (Signed)
Chronic off meds, improving. The 10-year ASCVD risk score [name] [name]., et al., 2013) is: 3.1%   Values used to calculate the score:     Age: 50 years     Sex: Male     Is Non-Hispanic African American: No     Diabetic: No     Tobacco smoker: No     Systolic Blood Pressure: 123456 mmHg     Is BP treated: No     HDL Cholesterol: 37.2 mg/dL     Total Cholesterol: 188 mg/dL

## 2021-07-02 DIAGNOSIS — Z23 Encounter for immunization: Secondary | ICD-10-CM | POA: Diagnosis not present

## 2022-01-11 NOTE — Progress Notes (Signed)
? ? ?PATIENT: Edward Adkins ?DOB: 1971-03-15 ? ?REASON FOR VISIT: follow up ?HISTORY FROM: patient ? ?Chief Complaint  ?Patient presents with  ? Follow-up  ?  Rm 1, alone. Here for yearly sz f/u. No sz like activity since last OV. Pt doing well.   ?  ? ?HISTORY OF PRESENT ILLNESS: ? ?01/12/22 ALL:  ?Edward Adkins returns for follow up for seizures. He continues to do well on carbamazepine. He is prescribed '200mg'$  TID but tells me, today, that he has taken '600mg'$  once daily for many years. He has tolerated dosing well. No seizure activity since 1991. He continues to work full time at [employer]. He lives alone. He drives without difficulty.  ? ?01/12/2021 ALL:  ?Edward Adkins returns for follow up for seizures. He continues carbamazepine '200mg'$  TID and is tolerating well. He is followed regularly by PCP. Labs 03/2020 unremarkable. He continues to live alone. He works full time. He is driving without difficulty.  ? ?01/08/2020 ALL:  ?Edward Adkins is a 51 y.o. male here today for follow up for seizures. He is doing very well. He continues carbamazepine '200mg'$  TID. No seizures. He is tolerating meds well. He works part time at [employer]. He drives without difficulty. He lives alone.  ? ?HISTORY: (copied from my note on 01/08/2019) ? ?Edward Adkins is a 51 y.o. male for follow up of seizure. He reports doing well on carbamazepine 200 mg 3 times daily.  He is tolerating medication well with no obvious adverse effects.  He denies any seizure activity.  Last seizure in September 1991. ? ?HISTORY (copied from Athol Martin's note on 01/03/2018) ?  ?Update 5/2/2019CM Edward Adkins, 51 year old male returns for follow-up with history of seizure disorder complex partial with secondary generalization.  He is currently on carbamazepine 200 mg 1 tablet 3 times daily.  He denies any side effects to the medication.  He denies any falls or balance issues. His last seizure was in September 1991.  He continues to work at [employer].  He exercises by playing golf.   He has a history of cerebral palsy mild mental retardation as well he returns for reevaluation.  No new medical issues.  He returns for reevaluation ?  ?UPDATE 08/10/16 (VRP): Since last visit, doing well. No sz. Last seizure was Sept 1991. Continues to work at [employer]. He takes CBZ '200mg'$  TID (5am, 6pm, 11pm).  ?  ?PRIOR HPI (CM):  Patient has history of cerebral palsy, mild mental retardation and seizure disorder. He has been followed at [facility] since [facility],  onset of seizure disorder at age 55. 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 He was originally on phenobarbital and developed a rash, switched to Dilantin  with recurrent seizures. Initiated on carbamazepine in 1991without further seizure activity since that time. Denies any side effects to his medications, any daytime drowsiness, feelings of being off balance, no missed doses of his medication, and no falls.  ? ? ?REVIEW OF SYSTEMS: Out of a complete 14 system review of symptoms, the patient complains only of the following symptoms, seizures and all other reviewed systems are negative. ? ?ALLERGIES: ?Allergies  ?Allergen Reactions  ? Phenobarbital   ?  REACTION: rash  ? ? ?HOME MEDICATIONS: ?Outpatient Medications Prior to Visit  ?Medication Sig Dispense Refill  ? levothyroxine (SYNTHROID) 50 MCG tablet Take 1 tablet (50 mcg total) by mouth  daily before breakfast. 90 tablet 3  ? Multiple Vitamin (MULTIVITAMIN) tablet Take 2 tablets by mouth daily.    ? carbamazepine (TEGRETOL) 200 MG tablet Take 1 tablet (200 mg total) by mouth 3 (three) times daily. 270 tablet 3  ? ?No facility-administered medications prior to visit.  ? ? ?PAST MEDICAL HISTORY: ?Past Medical History:  ?Diagnosis Date  ? Cerebral palsy ([place])   ? Complex partial seizure disorder ([place]) 05/78  ? Head CT's multiple-WNL, EEG R temporal lobe discharge ([facility])  ? Diverticulitis 11/6142  ? complicated  by -itis, intra abd abscess, and perforation  ? Diverticulitis of intestine with perforation 10/14/2014  ? Diverticulosis 11/1538  ? complicated by -itis, intra abd abscess, and perforation  ? Intra-abdominal abscess ([place])   ? Mild developmental delay   ? ? ?PAST SURGICAL HISTORY: ?Past Surgical History:  ?Procedure Laterality Date  ? COLONOSCOPY  2016  ? 2 TA, 2 HP, diverticulosis, rpt 5 yrs Pamalee Leyden)  ? ? ?FAMILY HISTORY: ?Family History  ?Problem Relation Age of Onset  ? Cancer Mother 7  ?     Leukemia  ? Cancer Father 3  ?     Prostate ca  ? CAD Father   ? Diabetes Father   ? Thyroid disease Father   ? ? ?SOCIAL HISTORY: ?Social History  ? ?Socioeconomic History  ? Marital status: Single  ?  Spouse name: Not on file  ? Number of children: 0  ? Years of education: 29  ? Highest education level: Not on file  ?Occupational History  ?  Employer: [employer]  ?Tobacco Use  ? Smoking status: Never  ? Smokeless tobacco: Never  ?Vaping Use  ? Vaping Use: Never used  ?Substance and Sexual Activity  ? Alcohol use: No  ?  Alcohol/week: 0.0 standard drinks  ? Drug use: No  ? Sexual activity: Not Currently  ?Other Topics Concern  ? Not on file  ?Social History Narrative  ? Patient is single and lives with his father.  ? Patient drinks two caffeine drinks daily.  ? Patient is ambi-dextrous.  ? Patient has a high school education.  ? 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  ?   ?   ?   ? ?Social Determinants of Health  ? ?Financial Resource Strain: Not on file  ?Food Insecurity: Not on file  ?Transportation Needs: Not on file  ?Physical Activity: Not on file  ?Stress: Not on file  ?Social Connections: Not on file  ?Intimate Partner Violence: Not on file  ? ? ? ? ?PHYSICAL EXAM ? ?Vitals:  ? 01/12/22 0910  ?BP: 111/80  ?Pulse: 80  ?Weight: 196 lb (88.9 kg)  ?Height: 5' 10.75" (1.797 m)  ? ? ?Body mass index is 27.53 kg/m?. ? ?Generalized: Well developed, in no acute distress  ?Cardiology: normal rate and rhythm, no  murmur noted ?Respiratory: clear to auscultation bilaterally  ?Neurological examination  ?Mentation: Alert oriented to time, place, history taking. Follows all commands speech and language fluent ?Cranial nerve II-XII: Pupils were equal round reactive to light. Extraocular movements were full, visual field were full on confrontational test. Facial sensation and strength were normal. Head turning and shoulder shrug  were normal and symmetric. ?Motor: The motor testing reveals 5 over 5 strength of all 4 extremities. Good symmetric motor tone is noted throughout.  ?Sensory: Sensory testing is intact to soft touch on all 4 extremities. No evidence of extinction is noted.  ?Coordination: Cerebellar testing reveals good finger-nose-finger  and heel-to-shin bilaterally.  ?Gait and station: Gait is normal.  ? ?DIAGNOSTIC DATA (LABS, IMAGING, TESTING) ?- I reviewed patient records, labs, notes, testing and imaging myself where available. ? ? ?  03/28/2019  ? 10:34 AM  ?MMSE - Mini Mental State Exam  ?Orientation to time 5  ?Orientation to Place 5  ?Registration 3  ?Attention/ Calculation 5  ?Recall 3  ?Language- name 2 objects 0  ?Language- repeat 1  ?Language- follow 3 step command 0  ?Language- read & follow direction 0  ?Write a sentence 0  ?Copy design 0  ?Total score 22  ?  ? ?Lab Results  ?Component Value Date  ? WBC 7.1 03/23/2021  ? HGB 14.2 03/23/2021  ? HCT 42.0 03/23/2021  ? MCV 90.9 03/23/2021  ? PLT 196.0 03/23/2021  ? ?   ?Component Value Date/Time  ? NA 141 03/23/2021 0736  ? NA 141 02/03/2019 1146  ? K 4.1 03/23/2021 0736  ? CL 107 03/23/2021 0736  ? CO2 27 03/23/2021 0736  ? GLUCOSE 87 03/23/2021 0736  ? BUN 14 03/23/2021 0736  ? BUN 12 02/03/2019 1146  ? CREATININE 0.80 03/23/2021 0736  ? CALCIUM 8.7 03/23/2021 0736  ? PROT 7.0 03/23/2021 0736  ? PROT 7.7 02/03/2019 1146  ? ALBUMIN 4.1 03/23/2021 0736  ? ALBUMIN 4.6 02/03/2019 1146  ? AST 12 03/23/2021 0736  ? ALT 10 03/23/2021 0736  ? ALKPHOS 66 03/23/2021  0736  ? BILITOT 0.5 03/23/2021 0736  ? BILITOT 0.3 02/03/2019 1146  ? GFRNONAA 112 02/03/2019 1146  ? GFRAA 129 02/03/2019 1146  ? ?Lab Results  ?Component Value Date  ? CHOL 188 03/23/2021  ? HDL 37.20 (

## 2022-01-11 NOTE — Patient Instructions (Addendum)
Below is our plan: ? ?We will continue carbamazepine '600mg'$  daily  ? ?Please make sure you are consistent with timing of seizure medication. I recommend annual visit with primary care provider (PCP) for complete physical and routine blood work. We will monitor vitamin D level. I recommend daily intake of vitamin D (400-800iu) and calcium (800-'1000mg'$ ) for bone health. Discuss Dexa screening with PCP.  ? ?According to [place] law, you can not drive unless you are seizure / syncope free for at least 6 months and under physician's care. ? ?Please maintain precautions. Do not participate in activities where a loss of awareness could harm you or someone else. No swimming alone, no tub bathing, no hot tubs, no driving, no operating motorized vehicles (cars, ATVs, motocycles, etc), lawnmowers, power tools or firearms. No standing at heights, such as rooftops, ladders or stairs. Avoid hot objects such as stoves, heaters, open fires. Wear a helmet when riding a bicycle, scooter, skateboard, etc. and avoid areas of traffic. Set your water heater to 120 degrees or less.  ? ? ?Please make sure you are staying well hydrated. I recommend 50-60 ounces daily. Well balanced diet and regular exercise encouraged. Consistent sleep schedule with 6-8 hours recommended.  ? ?Please continue follow up with care team as directed.  ? ?Follow up with me in 1 year  ? ?You may receive a survey regarding today's visit. I encourage you to leave honest feed back as I do use this information to improve patient care. Thank you for seeing me today!  ? ? ?

## 2022-01-12 ENCOUNTER — Encounter: Payer: Self-pay | Admitting: Family Medicine

## 2022-01-12 ENCOUNTER — Ambulatory Visit (INDEPENDENT_AMBULATORY_CARE_PROVIDER_SITE_OTHER): Payer: Medicare Other | Admitting: Family Medicine

## 2022-01-12 VITALS — BP 111/80 | HR 80 | Ht 70.75 in | Wt 196.0 lb

## 2022-01-12 DIAGNOSIS — G40219 Localization-related (focal) (partial) symptomatic epilepsy and epileptic syndromes with complex partial seizures, intractable, without status epilepticus: Secondary | ICD-10-CM

## 2022-01-12 MED ORDER — CARBAMAZEPINE 200 MG PO TABS: 200.0000 mg | ORAL_TABLET | Freq: Three times a day (TID) | ORAL | 3 refills | Status: DC

## 2022-01-13 LAB — CARBAMAZEPINE LEVEL, TOTAL: Carbamazepine (Tegretol), S: 11.2 ug/mL (ref 4.0–12.0)

## 2022-01-16 ENCOUNTER — Telehealth: Payer: Self-pay | Admitting: *Deleted

## 2022-01-16 NOTE — Telephone Encounter (Signed)
Called and LVM for patient about lab results per AL,NP note. Advised he did not have to call back unless he had further questions.  ?

## 2022-01-16 NOTE — Telephone Encounter (Signed)
-----   Message from Debbora Presto, NP sent at 01/16/2022  4:07 PM EDT ----- ?Labs are normal.  ?

## 2022-03-26 ENCOUNTER — Other Ambulatory Visit: Payer: Self-pay | Admitting: Family Medicine

## 2022-03-26 DIAGNOSIS — Z1159 Encounter for screening for other viral diseases: Secondary | ICD-10-CM

## 2022-03-26 DIAGNOSIS — Z5181 Encounter for therapeutic drug level monitoring: Secondary | ICD-10-CM

## 2022-03-26 DIAGNOSIS — G40209 Localization-related (focal) (partial) symptomatic epilepsy and epileptic syndromes with complex partial seizures, not intractable, without status epilepticus: Secondary | ICD-10-CM

## 2022-03-26 DIAGNOSIS — E039 Hypothyroidism, unspecified: Secondary | ICD-10-CM

## 2022-03-26 DIAGNOSIS — E785 Hyperlipidemia, unspecified: Secondary | ICD-10-CM

## 2022-03-29 ENCOUNTER — Other Ambulatory Visit (INDEPENDENT_AMBULATORY_CARE_PROVIDER_SITE_OTHER): Payer: Medicare Other

## 2022-03-29 DIAGNOSIS — E039 Hypothyroidism, unspecified: Secondary | ICD-10-CM

## 2022-03-29 DIAGNOSIS — R7989 Other specified abnormal findings of blood chemistry: Secondary | ICD-10-CM

## 2022-03-29 DIAGNOSIS — Z1159 Encounter for screening for other viral diseases: Secondary | ICD-10-CM | POA: Diagnosis not present

## 2022-03-29 DIAGNOSIS — E785 Hyperlipidemia, unspecified: Secondary | ICD-10-CM | POA: Diagnosis not present

## 2022-03-29 DIAGNOSIS — G40209 Localization-related (focal) (partial) symptomatic epilepsy and epileptic syndromes with complex partial seizures, not intractable, without status epilepticus: Secondary | ICD-10-CM

## 2022-03-29 LAB — CBC WITH DIFFERENTIAL/PLATELET
Basophils Absolute: 0.1 10*3/uL (ref 0.0–0.1)
Basophils Relative: 0.9 % (ref 0.0–3.0)
Eosinophils Absolute: 0.2 10*3/uL (ref 0.0–0.7)
Eosinophils Relative: 2.1 % (ref 0.0–5.0)
HCT: 42.1 % (ref 39.0–52.0)
Hemoglobin: 14.2 g/dL (ref 13.0–17.0)
Lymphocytes Relative: 19 % (ref 12.0–46.0)
Lymphs Abs: 1.5 10*3/uL (ref 0.7–4.0)
MCHC: 33.7 g/dL (ref 30.0–36.0)
MCV: 91.2 fl (ref 78.0–100.0)
Monocytes Absolute: 0.8 10*3/uL (ref 0.1–1.0)
Monocytes Relative: 10.4 % (ref 3.0–12.0)
Neutro Abs: 5.5 10*3/uL (ref 1.4–7.7)
Neutrophils Relative %: 67.6 % (ref 43.0–77.0)
Platelets: 186 10*3/uL (ref 150.0–400.0)
RBC: 4.62 Mil/uL (ref 4.22–5.81)
RDW: 12.9 % (ref 11.5–15.5)
WBC: 8.1 10*3/uL (ref 4.0–10.5)

## 2022-03-29 LAB — COMPREHENSIVE METABOLIC PANEL
ALT: 11 U/L (ref 0–53)
AST: 11 U/L (ref 0–37)
Albumin: 4.2 g/dL (ref 3.5–5.2)
Alkaline Phosphatase: 64 U/L (ref 39–117)
BUN: 16 mg/dL (ref 6–23)
CO2: 26 mEq/L (ref 19–32)
Calcium: 8.5 mg/dL (ref 8.4–10.5)
Chloride: 105 mEq/L (ref 96–112)
Creatinine, Ser: 0.87 mg/dL (ref 0.40–1.50)
GFR: 101.37 mL/min (ref >60.00)
Glucose, Bld: 80 mg/dL (ref 70–99)
Potassium: 4.3 mEq/L (ref 3.5–5.1)
Sodium: 140 mEq/L (ref 135–145)
Total Bilirubin: 0.5 mg/dL (ref 0.2–1.2)
Total Protein: 7.2 g/dL (ref 6.0–8.3)

## 2022-03-29 LAB — LIPID PANEL
Cholesterol: 185 mg/dL (ref 0–200)
HDL: 34.2 mg/dL — ABNORMAL LOW (ref >39.00)
LDL Cholesterol: 130 mg/dL — ABNORMAL HIGH (ref 0–99)
NonHDL: 150.5
Total CHOL/HDL Ratio: 5
Triglycerides: 104 mg/dL (ref 0.0–149.0)
VLDL: 20.8 mg/dL (ref 0.0–40.0)

## 2022-03-29 LAB — TSH: TSH: 0.15 u[IU]/mL — ABNORMAL LOW (ref 0.35–5.50)

## 2022-03-30 ENCOUNTER — Ambulatory Visit: Payer: Medicare Other

## 2022-03-30 ENCOUNTER — Other Ambulatory Visit (INDEPENDENT_AMBULATORY_CARE_PROVIDER_SITE_OTHER): Payer: Medicare Other

## 2022-03-30 DIAGNOSIS — R7989 Other specified abnormal findings of blood chemistry: Secondary | ICD-10-CM

## 2022-03-30 DIAGNOSIS — E039 Hypothyroidism, unspecified: Secondary | ICD-10-CM

## 2022-03-30 LAB — HEPATITIS C ANTIBODY: Hepatitis C Ab: NONREACTIVE

## 2022-03-30 LAB — T4, FREE: Free T4: 1.08 ng/dL (ref 0.60–1.60)

## 2022-03-30 NOTE — Addendum Note (Signed)
Addended by: Ellamae Sia on: 03/30/2022 04:13 PM   Modules accepted: Orders

## 2022-04-05 ENCOUNTER — Ambulatory Visit (INDEPENDENT_AMBULATORY_CARE_PROVIDER_SITE_OTHER): Payer: Medicare Other | Admitting: Family Medicine

## 2022-04-05 ENCOUNTER — Encounter: Payer: Self-pay | Admitting: Family Medicine

## 2022-04-05 VITALS — BP 118/84 | HR 83 | Temp 97.8°F | Ht 70.5 in | Wt 190.2 lb

## 2022-04-05 DIAGNOSIS — G809 Cerebral palsy, unspecified: Secondary | ICD-10-CM | POA: Diagnosis not present

## 2022-04-05 DIAGNOSIS — H547 Unspecified visual loss: Secondary | ICD-10-CM

## 2022-04-05 DIAGNOSIS — G40209 Localization-related (focal) (partial) symptomatic epilepsy and epileptic syndromes with complex partial seizures, not intractable, without status epilepticus: Secondary | ICD-10-CM

## 2022-04-05 DIAGNOSIS — R6250 Unspecified lack of expected normal physiological development in childhood: Secondary | ICD-10-CM

## 2022-04-05 DIAGNOSIS — Z7189 Other specified counseling: Secondary | ICD-10-CM

## 2022-04-05 DIAGNOSIS — E785 Hyperlipidemia, unspecified: Secondary | ICD-10-CM | POA: Diagnosis not present

## 2022-04-05 DIAGNOSIS — E039 Hypothyroidism, unspecified: Secondary | ICD-10-CM | POA: Diagnosis not present

## 2022-04-05 DIAGNOSIS — Z0000 Encounter for general adult medical examination without abnormal findings: Secondary | ICD-10-CM | POA: Diagnosis not present

## 2022-04-05 DIAGNOSIS — D126 Benign neoplasm of colon, unspecified: Secondary | ICD-10-CM

## 2022-04-05 MED ORDER — LEVOTHYROXINE SODIUM 50 MCG PO TABS
50.0000 ug | ORAL_TABLET | Freq: Every day | ORAL | 3 refills | Status: DC
Start: 2022-04-05 — End: 2023-04-06

## 2022-04-05 NOTE — Assessment & Plan Note (Addendum)
TSH low today - he endorses taking some extra doses after missing some doses. Previously well controlled. Will not change dosing at this time but I did ask him to return in 3 months for rpt TFTs and determine dosing change at that time.  Encouraged only taking 1 tablet of levothyroxine every day.

## 2022-04-05 NOTE — Assessment & Plan Note (Signed)
Chronic, off medication, reviewed diet and lifestyle choices to maintain cholesterol control. The 10-year ASCVD risk score (Arnett DK, et al., 2019) is: 3.7%   Values used to calculate the score:     Age: 51 years     Sex: Male     Is Non-Hispanic African American: No     Diabetic: No     Tobacco smoker: No     Systolic Blood Pressure: 953 mmHg     Is BP treated: No     HDL Cholesterol: 34.2 mg/dL     Total Cholesterol: 185 mg/dL

## 2022-04-05 NOTE — Patient Instructions (Addendum)
Some vision difficulty noted today - on the right. Go to [facility] to get an eye exam.  Continue thyroid medicine daily, but schedule lab visit only in 3 months to recheck thyroid levels.  We will refer you back to [facility] to discuss repeat colonoscopy.  Advanced directive packet provided today.  Return as needed or in 1 year for next medicare wellness visit.   Health Maintenance, Male Adopting a healthy lifestyle and getting preventive care are important in promoting health and wellness. Ask your health care provider about: The right schedule for you to have regular tests and exams. Things you can do on your own to prevent diseases and keep yourself healthy. What should I know about diet, weight, and exercise? Eat a healthy diet  Eat a diet that includes plenty of vegetables, fruits, low-fat dairy products, and lean protein. Do not eat a lot of foods that are high in solid fats, added sugars, or sodium. Maintain a healthy weight Body mass index (BMI) is a measurement that can be used to identify possible weight problems. It estimates body fat based on height and weight. Your health care provider can help determine your BMI and help you achieve or maintain a healthy weight. Get regular exercise Get regular exercise. This is one of the most important things you can do for your health. Most adults should: Exercise for at least 150 minutes each week. The exercise should increase your heart rate and make you sweat (moderate-intensity exercise). Do strengthening exercises at least twice a week. This is in addition to the moderate-intensity exercise. Spend less time sitting. Even light physical activity can be beneficial. Watch cholesterol and blood lipids Have your blood tested for lipids and cholesterol at 48 years of age, then have this test every 5 years. You may need to have your cholesterol levels checked more often if: Your lipid or cholesterol levels are high. You are older than 49  years of age. You are at high risk for heart disease. What should I know about cancer screening? Many types of cancers can be detected early and may often be prevented. Depending on your health history and family history, you may need to have cancer screening at various ages. This may include screening for: Colorectal cancer. Prostate cancer. Skin cancer. Lung cancer. What should I know about heart disease, diabetes, and high blood pressure? Blood pressure and heart disease High blood pressure causes heart disease and increases the risk of stroke. This is more likely to develop in people who have high blood pressure readings or are overweight. Talk with your health care provider about your target blood pressure readings. Have your blood pressure checked: Every 3-5 years if you are 56-14 years of age. Every year if you are 48 years old or older. If you are between the ages of 40 and 83 and are a current or former smoker, ask your health care provider if you should have a one-time screening for abdominal aortic aneurysm (AAA). Diabetes Have regular diabetes screenings. This checks your fasting blood sugar level. Have the screening done: Once every three years after age 51 if you are at a normal weight and have a low risk for diabetes. More often and at a younger age if you are overweight or have a high risk for diabetes. What should I know about preventing infection? Hepatitis B If you have a higher risk for hepatitis B, you should be screened for this virus. Talk with your health care provider to find out if  you are at risk for hepatitis B infection. Hepatitis C Blood testing is recommended for: Everyone born from [birth year] through 1963. Anyone with known risk factors for hepatitis C. Sexually transmitted infections (STIs) You should be screened each year for STIs, including gonorrhea and chlamydia, if: You are sexually active and are younger than 49 years of age. You are older than 49 years  of age and your health care provider tells you that you are at risk for this type of infection. Your sexual activity has changed since you were last screened, and you are at increased risk for chlamydia or gonorrhea. Ask your health care provider if you are at risk. Ask your health care provider about whether you are at high risk for HIV. Your health care provider may recommend a prescription medicine to help prevent HIV infection. If you choose to take medicine to prevent HIV, you should first get tested for HIV. You should then be tested every 3 months for as long as you are taking the medicine. Follow these instructions at home: Alcohol use Do not drink alcohol if your health care provider tells you not to drink. If you drink alcohol: Limit how much you have to 0-2 drinks a day. Know how much alcohol is in your drink. In the [place] one drink equals one 12 oz bottle of beer (355 mL), one 5 oz glass of wine (148 mL), or one 1 oz glass of hard liquor (44 mL). Lifestyle Do not use any products that contain nicotine or tobacco. These products include cigarettes, chewing tobacco, and vaping devices, such as e-cigarettes. If you need help quitting, ask your health care provider. Do not use street drugs. Do not share needles. Ask your health care provider for help if you need support or information about quitting drugs. General instructions Schedule regular health, dental, and eye exams. Stay current with your vaccines. Tell your health care provider if: You often feel depressed. You have ever been abused or do not feel safe at home. Summary Adopting a healthy lifestyle and getting preventive care are important in promoting health and wellness. Follow your health care provider's instructions about healthy diet, exercising, and getting tested or screened for diseases. Follow your health care provider's instructions on monitoring your cholesterol and blood pressure. This information is not intended  to replace advice given to you by your health care provider. Make sure you discuss any questions you have with your health care provider. Document Revised: 01/10/2021 Document Reviewed: 01/10/2021 Elsevier Patient Education  [place].

## 2022-04-05 NOTE — Assessment & Plan Note (Signed)
Appreciate neuro care - on tegretol

## 2022-04-05 NOTE — Assessment & Plan Note (Signed)
Has not seen eye doctor-discussed setting up general eye exam for acuity and glaucoma screening. No signs of cataract today. He will check in at [facility] to schedule appointment, let us know if referral is needed.

## 2022-04-05 NOTE — Progress Notes (Signed)
Patient ID: Edward Adkins, male    DOB: April 02, 1971, 51 y.o.   MRN: [id number]  This visit was conducted in person.  BP 118/84   Pulse 83   Temp 97.8 F (36.6 C) (Temporal)   Ht 5' 10.5" (1.791 m)   Wt 190 lb 4 oz (86.3 kg)   SpO2 98%   BMI 26.91 kg/m    CC: AMW f/u visit  Subjective:   HPI: Edward Adkins is a 51 y.o. male presenting on 04/05/2022 for Medicare Wellness   Did not see health advisor.   Hearing Screening   '500Hz'$  '1000Hz'$  '2000Hz'$  '4000Hz'$   Right ear 20 25 40 0  Left ear 40 40 40 0   Vision Screening   Right eye Left eye Both eyes  Without correction '20/50 20/40 20/40 '$  With correction       Flowsheet Row Office Visit from 04/05/2022 in [place] at [facility] Total Score 1          04/05/2022    8:13 AM 04/04/2021    8:45 AM 04/01/2020    8:21 AM 03/28/2019   10:31 AM 03/21/2018    9:17 AM  Fall Risk   Falls in the past year? 0 0 0 0 No  Risk for fall due to :    Medication side effect   Follow up   Falls evaluation completed Falls evaluation completed;Falls prevention discussed    H/o cerebral palsy with mild developmental delay on carbamazepine for h/o seizures followed by [facility].  Father passed away 07-06-2019.   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 No blood in stool or bowel changes.  Prostate cancer screening - father with prostate cancer - will check next labs Lung cancer screening - not indicated  Flu shot yearly  Edward Adkins x2 2021 ([facility] in [place]), no booster  Tdap 03/2020  Pneumonia shot - not due yet Shingles shot - not due yet Advanced directive discussion - has not set up. Would want neighbor Edward Adkins to be HCPOA. does not have living will - packet provided today.  Seat belt use discussed Sunscreen use discussed, no changing moles on skin.  Non smoker  Alcohol - none - avoids due to seizure meds Dentist Q6 mo  Eye exam - has not seen. No known  fmhx glaucoma.  Bowel - no constipation Bladder- no incontinence  Patient is single. Father passed away July 06, 2019.  Patient drinks two caffeine drinks daily (sodas).  Patient is ambi-dextrous.  Edu: Patient has a high school education.  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  Activity: plays golf, mows 4 acre yard  Diet: drinking more water/gatorade. fruits/vegetables daily     Relevant past medical, surgical, family and social history reviewed and updated as indicated. Interim medical history since our last visit reviewed. Allergies and medications reviewed and updated. Outpatient Medications Prior to Visit  Medication Sig Dispense Refill   carbamazepine (TEGRETOL) 200 MG tablet Take 1 tablet (200 mg total) by mouth 3 (three) times daily. 270 tablet 3   Multiple Vitamin (MULTIVITAMIN) tablet Take 2 tablets by mouth daily.     levothyroxine (SYNTHROID) 50 MCG tablet Take 1 tablet (50 mcg total) by mouth daily before breakfast. 90 tablet 3   No facility-administered medications prior to visit.     Per HPI unless specifically indicated in ROS section below Review of Systems  Objective:  BP 118/84  Pulse 83   Temp 97.8 F (36.6 C) (Temporal)   Ht 5' 10.5" (1.791 m)   Wt 190 lb 4 oz (86.3 kg)   SpO2 98%   BMI 26.91 kg/m   Wt Readings from Last 3 Encounters:  04/05/22 190 lb 4 oz (86.3 kg)  01/12/22 196 lb (88.9 kg)  04/04/21 198 lb 6 oz (90 kg)      Physical Exam Vitals and nursing note reviewed.  Constitutional:      General: He is not in acute distress.    Appearance: Normal appearance. He is well-developed. He is not ill-appearing.  HENT:     Head: Normocephalic and atraumatic.     Right Ear: Hearing, tympanic membrane, ear canal and external ear normal.     Left Ear: Hearing, tympanic membrane, ear canal and external ear normal.  Eyes:     General: No scleral icterus.    Extraocular Movements: Extraocular movements intact.     Conjunctiva/sclera: Conjunctivae  normal.     Pupils: Pupils are equal, round, and reactive to light.  Neck:     Thyroid: No thyroid mass or thyromegaly.  Cardiovascular:     Rate and Rhythm: Normal rate and regular rhythm.     Pulses: Normal pulses.          Radial pulses are 2+ on the right side and 2+ on the left side.     Heart sounds: Normal heart sounds. No murmur heard. Pulmonary:     Effort: Pulmonary effort is normal. No respiratory distress.     Breath sounds: Normal breath sounds. No wheezing, rhonchi or rales.  Abdominal:     General: Bowel sounds are normal. There is no distension.     Palpations: Abdomen is soft. There is no mass.     Tenderness: There is no abdominal tenderness. There is no guarding or rebound.     Hernia: No hernia is present.  Musculoskeletal:        General: Normal range of motion.     Cervical back: Normal range of motion and neck supple.     Right lower leg: No edema.     Left lower leg: No edema.  Lymphadenopathy:     Cervical: No cervical adenopathy.  Skin:    General: Skin is warm and dry.     Findings: No rash.  Neurological:     General: No focal deficit present.     Mental Status: He is alert and oriented to person, place, and time.     Comments:  Recall 2/3, 3/3 with cue  Calculation 4/5 DLROD  Psychiatric:        Mood and Affect: Mood normal.        Behavior: Behavior normal.        Thought Content: Thought content normal.        Judgment: Judgment normal.       Results for orders placed or performed in visit on 03/30/22  T4, free  Result Value Ref Range   Free T4 1.08 0.60 - 1.60 ng/dL   No results found for: "PSA1", "PSA"  Assessment & Plan:   Problem List Items Addressed This Visit     Medicare annual wellness visit, subsequent - Primary (Chronic)    I have personally reviewed the Medicare Annual Wellness questionnaire and have noted 1. The patient's medical and social history 2. Their use of alcohol, tobacco or illicit drugs 3. Their current  medications and supplements 4. The patient's functional ability including  ADL's, fall risks, home safety risks and hearing or visual impairment. Cognitive function has been assessed and addressed as indicated.  5. Diet and physical activity 6. Evidence for depression or mood disorders The patients weight, height, BMI have been recorded in the chart. I have made referrals, counseling and provided education to the patient based on review of the above and I have provided the pt with a written personalized care plan for preventive services. Provider list updated.. See scanned questionairre as needed for further documentation. Reviewed preventative protocols and updated unless pt declined.       Advanced care planning/counseling discussion (Chronic)    Advanced directive discussion - has not set up. Would want neighbor Edward Adkins to be HCPOA. does not have living will - packet provided today.       Complex partial seizures ([place])    Appreciate neuro care - on tegretol       Dyslipidemia    Chronic, off medication, reviewed diet and lifestyle choices to maintain cholesterol control. The 10-year ASCVD risk score (Arnett DK, et al., 2019) is: 3.7%   Values used to calculate the score:     Age: 63 years     Sex: Male     Is Non-Hispanic African American: No     Diabetic: No     Tobacco smoker: No     Systolic Blood Pressure: 078 mmHg     Is BP treated: No     HDL Cholesterol: 34.2 mg/dL     Total Cholesterol: 185 mg/dL       Cerebral palsy (HCC)   Mild developmental delay   Acquired hypothyroidism    TSH low today - he endorses taking some extra doses after missing some doses. Previously well controlled. Will not change dosing at this time but I did ask him to return in 3 months for rpt TFTs and determine dosing change at that time.  Encouraged only taking 1 tablet of levothyroxine every day.      Relevant Medications   levothyroxine (SYNTHROID) 50 MCG tablet   Other Relevant Orders    TSH   Decreased visual acuity    Has not seen eye doctor-discussed setting up general eye exam for acuity and glaucoma screening. No signs of cataract today. He will check in at [facility] to schedule appointment, let us know if referral is needed.      Other Visit Diagnoses     Adenomatous polyp of colon, unspecified part of colon       Relevant Orders   Ambulatory referral to Gastroenterology        Meds ordered this encounter  Medications   levothyroxine (SYNTHROID) 50 MCG tablet    Sig: Take 1 tablet (50 mcg total) by mouth daily before breakfast.    Dispense:  90 tablet    Refill:  3   Orders Placed This Encounter  Procedures   TSH    Standing Status:   Future    Standing Expiration Date:   04/06/2023   Ambulatory referral to Gastroenterology    Referral Priority:   Routine    Referral Type:   Consultation    Referral Reason:   Specialty Services Required    Number of Visits Requested:   1    Patient instruction: Some vision difficulty noted today - on the right. Go to [facility] to get an eye exam.  Continue thyroid medicine daily, but schedule lab visit only in 3 months to recheck thyroid levels.  We will refer  you back to [facility] to discuss repeat colonoscopy.  Advanced directive packet provided today.  Return as needed or in 1 year for next medicare wellness visit  Follow up plan: Return in about 1 year (around 04/06/2023) for medicare wellness visit.  [name], MD

## 2022-04-05 NOTE — Assessment & Plan Note (Signed)
Advanced directive discussion - has not set up. Would want neighbor Sherrill Raring to be HCPOA. does not have living will - packet provided today.

## 2022-04-05 NOTE — Assessment & Plan Note (Signed)

## 2022-07-06 ENCOUNTER — Other Ambulatory Visit (INDEPENDENT_AMBULATORY_CARE_PROVIDER_SITE_OTHER): Payer: Self-pay

## 2022-07-06 DIAGNOSIS — E039 Hypothyroidism, unspecified: Secondary | ICD-10-CM

## 2022-07-06 LAB — TSH: TSH: 4.12 u[IU]/mL (ref 0.35–5.50)

## 2022-07-31 ENCOUNTER — Encounter: Payer: Self-pay | Admitting: Family Medicine

## 2022-07-31 HISTORY — PX: COLONOSCOPY: SHX174

## 2022-07-31 LAB — HM COLONOSCOPY

## 2022-08-04 ENCOUNTER — Encounter: Payer: Self-pay | Admitting: Family Medicine

## 2023-01-17 NOTE — Progress Notes (Unsigned)
PATIENT: Edward Adkins DOB: February 15, 1971  REASON FOR VISIT: follow up HISTORY FROM: patient  No chief complaint on file.    HISTORY OF PRESENT ILLNESS:  01/17/23 ALL:  Mr Edward Adkins returns for follow up for seizures. He continues carbamazepine 600mg  daily.   01/12/2022 ALL: Edward Adkins returns for follow up for seizures. He continues to do well on carbamazepine. He is prescribed 200mg  TID but tells me, today, that he has taken 600mg  once daily for many years. He has tolerated dosing well. No seizure activity since 1991. He continues to work full time at [employer]. He lives alone. He drives without difficulty.   01/12/2021 ALL:  Edward Adkins returns for follow up for seizures. He continues carbamazepine 200mg  TID and is tolerating well. He is followed regularly by PCP. Labs 03/2020 unremarkable. He continues to live alone. He works full time. He is driving without difficulty.   01/08/2020 ALL:  Edward Adkins is a 52 y.o. male here today for follow up for seizures. He is doing very well. He continues carbamazepine 200mg  TID. No seizures. He is tolerating meds well. He works part time at [employer]. He drives without difficulty. He lives alone.   HISTORY: (copied from my note on 01/08/2019)  Edward Adkins is a 52 y.o. male for follow up of seizure. He reports doing well on carbamazepine 200 mg 3 times daily.  He is tolerating medication well with no obvious adverse effects.  He denies any seizure activity.  Last seizure in September 1991.  HISTORY (copied from Edward Adkins's note on 01/03/2018)   Update 5/2/2019CM Edward Adkins, 52 year old male returns for follow-up with history of seizure disorder complex partial with secondary generalization.  He is currently on carbamazepine 200 mg 1 tablet 3 times daily.  He denies any side effects to the medication.  He denies any falls or balance issues. His last seizure was in September 1991.  He continues to work at [employer].  He exercises by playing golf.  He has a  history of cerebral palsy mild mental retardation as well he returns for reevaluation.  No new medical issues.  He returns for reevaluation   UPDATE 08/10/16 (VRP): Since last visit, doing well. No sz. Last seizure was Sept 1991. Continues to work at [employer]. He takes CBZ 200mg  TID (5am, 6pm, 11pm).    PRIOR HPI (CM):  Patient has history of cerebral palsy, mild mental retardation and seizure disorder. He has been followed at [name] since 1984,  onset of seizure disorder at age 48. 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 He was originally on phenobarbital and developed a rash, switched to Dilantin  with recurrent seizures. Initiated on carbamazepine in 1991without further seizure activity since that time. Denies any side effects to his medications, any daytime drowsiness, feelings of being off balance, no missed doses of his medication, and no falls.    REVIEW OF SYSTEMS: Out of a complete 14 system review of symptoms, the patient complains only of the following symptoms, seizures and all other reviewed systems are negative.  ALLERGIES: Allergies  Allergen Reactions   Phenobarbital     REACTION: rash    HOME MEDICATIONS: Outpatient Medications Prior to Visit  Medication Sig Dispense Refill   carbamazepine (TEGRETOL) 200 MG tablet Take 1 tablet (200 mg total) by mouth 3 (three) times daily. 270 tablet 3   levothyroxine (  SYNTHROID) 50 MCG tablet Take 1 tablet (50 mcg total) by mouth daily before breakfast. 90 tablet 3   Multiple Vitamin (MULTIVITAMIN) tablet Take 2 tablets by mouth daily.     No facility-administered medications prior to visit.    PAST MEDICAL HISTORY: Past Medical History:  Diagnosis Date   Cerebral palsy (HCC)    Complex partial seizure disorder (HCC) 05/78   Head CT's multiple-WNL, EEG R temporal lobe discharge (GNA)   Diverticulitis 10/2014   complicated by -itis,  intra abd abscess, and perforation   Diverticulitis of intestine with perforation 10/14/2014   Diverticulosis 10/2014   complicated by -itis, intra abd abscess, and perforation   Intra-abdominal abscess (HCC)    Mild developmental delay     PAST SURGICAL HISTORY: Past Surgical History:  Procedure Laterality Date   COLONOSCOPY  2016   2 TA, 2 HP, diverticulosis, rpt 5 yrs [name])   COLONOSCOPY  07/31/2022   diverticulosis, int hem, rpt 7 yrs Transport planner)    FAMILY HISTORY: Family History  Problem Relation Age of Onset   Cancer Mother 74       Leukemia   Cancer Father 46       Prostate ca   CAD Father    Diabetes Father    Thyroid disease Father     SOCIAL HISTORY: Social History   Socioeconomic History   Marital status: Single    Spouse name: Not on file   Number of children: 0   Years of education: 12   Highest education level: Not on file  Occupational History    Employer: [employer]  Tobacco Use   Smoking status: Never   Smokeless tobacco: Never  Vaping Use   Vaping Use: Never used  Substance and Sexual Activity   Alcohol use: No    Alcohol/week: 0.0 standard drinks of alcohol   Drug use: No   Sexual activity: Not Currently  Other Topics Concern   Not on file  Social History Narrative   Patient is single and lives with his father.   Patient drinks two caffeine drinks daily.   Patient is ambi-dextrous.   Patient has a high school education.   Patient is a Merchandiser, retail at [employer].            Social Determinants of Health   Financial Resource Strain: Low Risk  (03/28/2019)   Overall Financial Resource Strain (CARDIA)    Difficulty of Paying Living Expenses: Not hard at all  Food Insecurity: No Food Insecurity (03/28/2019)   Hunger Vital Sign    Worried About Running Out of Food in the Last Year: Never true    Ran Out of Food in the Last Year: Never true  Transportation Needs: No Transportation Needs (03/28/2019)   PRAPARE - Therapist, art (Medical): No    Lack of Transportation (Non-Medical): No  Physical Activity: Sufficiently Active (03/28/2019)   Exercise Vital Sign    Days of Exercise per Week: 2 days    Minutes of Exercise per Session: 150+ min  Stress: No Stress Concern Present (03/28/2019)   [employer] of Occupational Health - Occupational Stress Questionnaire    Feeling of Stress : Not at all  Social Connections: Not on file  Intimate Partner Violence: Not At Risk (03/28/2019)   Humiliation, Afraid, Rape, and Kick questionnaire    Fear of Current or Ex-Partner: No    Emotionally Abused: No    Physically Abused: No    Sexually  Abused: No      PHYSICAL EXAM  There were no vitals filed for this visit.   There is no height or weight on file to calculate BMI.  Generalized: Well developed, in no acute distress  Cardiology: normal rate and rhythm, no murmur noted Respiratory: clear to auscultation bilaterally  Neurological examination  Mentation: Alert oriented to time, place, history taking. Follows all commands speech and language fluent Cranial nerve II-XII: Pupils were equal round reactive to light. Extraocular movements were full, visual field were full on confrontational test. Facial sensation and strength were normal. Head turning and shoulder shrug  were normal and symmetric. Motor: The motor testing reveals 5 over 5 strength of all 4 extremities. Good symmetric motor tone is noted throughout.  Sensory: Sensory testing is intact to soft touch on all 4 extremities. No evidence of extinction is noted.  Coordination: Cerebellar testing reveals good finger-nose-finger and heel-to-shin bilaterally.  Gait and station: Gait is normal.   DIAGNOSTIC DATA (LABS, IMAGING, TESTING) - I reviewed patient records, labs, notes, testing and imaging myself where available.     03/28/2019   10:34 AM  MMSE - Mini Mental State Exam  Orientation to time 5  Orientation to Place 5   Registration 3  Attention/ Calculation 5  Recall 3  Language- name 2 objects 0  Language- repeat 1  Language- follow 3 step command 0  Language- read & follow direction 0  Write a sentence 0  Copy design 0  Total score 22     Lab Results  Component Value Date   WBC 8.1 03/29/2022   HGB 14.2 03/29/2022   HCT 42.1 03/29/2022   MCV 91.2 03/29/2022   PLT 186.0 03/29/2022      Component Value Date/Time   NA 140 03/29/2022 0729   NA 141 02/03/2019 1146   K 4.3 03/29/2022 0729   CL 105 03/29/2022 0729   CO2 26 03/29/2022 0729   GLUCOSE 80 03/29/2022 0729   BUN 16 03/29/2022 0729   BUN 12 02/03/2019 1146   CREATININE 0.87 03/29/2022 0729   CALCIUM 8.5 03/29/2022 0729   PROT 7.2 03/29/2022 0729   PROT 7.7 02/03/2019 1146   ALBUMIN 4.2 03/29/2022 0729   ALBUMIN 4.6 02/03/2019 1146   AST 11 03/29/2022 0729   ALT 11 03/29/2022 0729   ALKPHOS 64 03/29/2022 0729   BILITOT 0.5 03/29/2022 0729   BILITOT 0.3 02/03/2019 1146   GFRNONAA 112 02/03/2019 1146   GFRAA 129 02/03/2019 1146   Lab Results  Component Value Date   CHOL 185 03/29/2022   HDL 34.20 (L) 03/29/2022   LDLCALC 130 (H) 03/29/2022   TRIG 104.0 03/29/2022   CHOLHDL 5 03/29/2022   No results found for: "HGBA1C" No results found for: "VITAMINB12" Lab Results  Component Value Date   TSH 4.12 07/06/2022     ASSESSMENT AND PLAN 52 y.o. year old male  has a past medical history of Cerebral palsy (HCC), Complex partial seizure disorder (HCC) (05/78), Diverticulitis (10/2014), Diverticulitis of intestine with perforation (10/14/2014), Diverticulosis (10/2014), Intra-abdominal abscess (HCC), and Mild developmental delay. here with   No diagnosis found.   Shedrick is doing very well today.  He continues carbamazepine 600mg  daily and is tolerating well.  Last seizure in 1991.  He lives alone, independent in ADLs and working full time at [employer].  He is followed closely by primary care. CPE labs in 03/2021 reviewed for  today's visit and are unremarkable. We will update carbamazepine level,  today.  He was encouraged to stay well-hydrated.  Well-balanced diet and regular exercise advised.  He will return to see me in 1 year, sooner if needed.  He verbalizes understanding and agreement with this plan.   No orders of the defined types were placed in this encounter.    No orders of the defined types were placed in this encounter.      Shawnie Dapper, FNP-C 01/17/2023, 8:21 AM [facility] [address] [place], [place] [address] [phone]

## 2023-01-17 NOTE — Patient Instructions (Signed)
Below is our plan:  We will continue carbamazepine 600mg  daily.   Please make sure you are consistent with timing of seizure medication. I recommend annual visit with primary care provider (PCP) for complete physical and routine blood work. I recommend daily intake of vitamin D (400-800iu) and calcium (800-1000mg ) for bone health. Discuss Dexa screening with PCP.   According to [place] law, you can not drive unless you are seizure / syncope free for at least 6 months and under physician's care.  Please maintain precautions. Do not participate in activities where a loss of awareness could harm you or someone else. No swimming alone, no tub bathing, no hot tubs, no driving, no operating motorized vehicles (cars, ATVs, motocycles, etc), lawnmowers, power tools or firearms. No standing at heights, such as rooftops, ladders or stairs. Avoid hot objects such as stoves, heaters, open fires. Wear a helmet when riding a bicycle, scooter, skateboard, etc. and avoid areas of traffic. Set your water heater to 120 degrees or less.  Please make sure you are staying well hydrated. I recommend 50-60 ounces daily. Well balanced diet and regular exercise encouraged. Consistent sleep schedule with 6-8 hours recommended.   Please continue follow up with care team as directed.   Follow up with me in 1 year  You may receive a survey regarding today's visit. I encourage you to leave honest feed back as I do use this information to improve patient care. Thank you for seeing me today!

## 2023-01-18 ENCOUNTER — Encounter: Payer: Self-pay | Admitting: Family Medicine

## 2023-01-18 ENCOUNTER — Ambulatory Visit (INDEPENDENT_AMBULATORY_CARE_PROVIDER_SITE_OTHER): Payer: Self-pay | Admitting: Family Medicine

## 2023-01-18 VITALS — BP 121/82 | HR 87 | Ht 68.0 in | Wt 189.0 lb

## 2023-01-18 DIAGNOSIS — G40219 Localization-related (focal) (partial) symptomatic epilepsy and epileptic syndromes with complex partial seizures, intractable, without status epilepticus: Secondary | ICD-10-CM

## 2023-01-18 MED ORDER — CARBAMAZEPINE 200 MG PO TABS
200.0000 mg | ORAL_TABLET | Freq: Three times a day (TID) | ORAL | 3 refills | Status: DC
Start: 2023-01-18 — End: 2023-10-18

## 2023-01-19 LAB — CARBAMAZEPINE LEVEL, TOTAL: Carbamazepine (Tegretol), S: 11 ug/mL (ref 4.0–12.0)

## 2023-01-22 ENCOUNTER — Telehealth: Payer: Self-pay | Admitting: *Deleted

## 2023-01-22 NOTE — Telephone Encounter (Signed)
-----   Message from Shawnie Dapper, NP sent at 01/22/2023  7:52 AM EDT ----- Labs are normal. Continue current treatment plan.

## 2023-01-22 NOTE — Telephone Encounter (Signed)
Detailed message regarding results left on patient home # per [facility] access

## 2023-03-29 ENCOUNTER — Other Ambulatory Visit: Payer: Self-pay | Admitting: Family Medicine

## 2023-03-29 DIAGNOSIS — E039 Hypothyroidism, unspecified: Secondary | ICD-10-CM

## 2023-03-29 DIAGNOSIS — E785 Hyperlipidemia, unspecified: Secondary | ICD-10-CM

## 2023-03-29 DIAGNOSIS — Z125 Encounter for screening for malignant neoplasm of prostate: Secondary | ICD-10-CM

## 2023-03-30 ENCOUNTER — Other Ambulatory Visit (INDEPENDENT_AMBULATORY_CARE_PROVIDER_SITE_OTHER): Payer: Self-pay

## 2023-03-30 DIAGNOSIS — Z125 Encounter for screening for malignant neoplasm of prostate: Secondary | ICD-10-CM

## 2023-03-30 DIAGNOSIS — E785 Hyperlipidemia, unspecified: Secondary | ICD-10-CM

## 2023-03-30 DIAGNOSIS — E039 Hypothyroidism, unspecified: Secondary | ICD-10-CM

## 2023-03-30 LAB — COMPREHENSIVE METABOLIC PANEL
ALT: 13 U/L (ref 0–53)
AST: 12 U/L (ref 0–37)
Albumin: 4.3 g/dL (ref 3.5–5.2)
Alkaline Phosphatase: 70 U/L (ref 39–117)
BUN: 21 mg/dL (ref 6–23)
CO2: 25 mEq/L (ref 19–32)
Calcium: 8.8 mg/dL (ref 8.4–10.5)
Chloride: 104 mEq/L (ref 96–112)
Creatinine, Ser: 0.83 mg/dL (ref 0.40–1.50)
GFR: 102.1 mL/min (ref >60.00)
Glucose, Bld: 84 mg/dL (ref 70–99)
Potassium: 4.4 mEq/L (ref 3.5–5.1)
Sodium: 139 mEq/L (ref 135–145)
Total Bilirubin: 0.5 mg/dL (ref 0.2–1.2)
Total Protein: 7.2 g/dL (ref 6.0–8.3)

## 2023-03-30 LAB — LIPID PANEL
Cholesterol: 202 mg/dL — ABNORMAL HIGH (ref 0–200)
HDL: 37.1 mg/dL — ABNORMAL LOW (ref >39.00)
LDL Cholesterol: 144 mg/dL — ABNORMAL HIGH (ref 0–99)
NonHDL: 165.29
Total CHOL/HDL Ratio: 5
Triglycerides: 107 mg/dL (ref 0.0–149.0)
VLDL: 21.4 mg/dL (ref 0.0–40.0)

## 2023-03-30 LAB — TSH: TSH: 3.38 u[IU]/mL (ref 0.35–5.50)

## 2023-03-30 LAB — PSA: PSA: 1.63 ng/mL (ref 0.10–4.00)

## 2023-04-06 ENCOUNTER — Encounter: Payer: Self-pay | Admitting: Family Medicine

## 2023-04-06 ENCOUNTER — Ambulatory Visit (INDEPENDENT_AMBULATORY_CARE_PROVIDER_SITE_OTHER): Payer: Self-pay | Admitting: Family Medicine

## 2023-04-06 VITALS — BP 126/68 | HR 80 | Temp 97.5°F | Ht 70.5 in | Wt 186.5 lb

## 2023-04-06 DIAGNOSIS — E039 Hypothyroidism, unspecified: Secondary | ICD-10-CM

## 2023-04-06 DIAGNOSIS — G40209 Localization-related (focal) (partial) symptomatic epilepsy and epileptic syndromes with complex partial seizures, not intractable, without status epilepticus: Secondary | ICD-10-CM

## 2023-04-06 DIAGNOSIS — G809 Cerebral palsy, unspecified: Secondary | ICD-10-CM

## 2023-04-06 DIAGNOSIS — E785 Hyperlipidemia, unspecified: Secondary | ICD-10-CM

## 2023-04-06 DIAGNOSIS — R6250 Unspecified lack of expected normal physiological development in childhood: Secondary | ICD-10-CM

## 2023-04-06 DIAGNOSIS — Z5989 Other problems related to housing and economic circumstances: Secondary | ICD-10-CM

## 2023-04-06 MED ORDER — LEVOTHYROXINE SODIUM 50 MCG PO TABS: 50.0000 ug | ORAL_TABLET | Freq: Every day | ORAL | 4 refills | Status: DC

## 2023-04-06 MED ORDER — CALCIUM CARB-CHOLECALCIFEROL 600-5 MG-MCG PO TABS: 1.0000 | ORAL_TABLET | Freq: Every day | ORAL | Status: DC

## 2023-04-06 NOTE — Assessment & Plan Note (Signed)
Lost insurance. Will ask care coordinator to reach out to patient to help re-enroll.

## 2023-04-06 NOTE — Assessment & Plan Note (Signed)
Stable period. Continues working at [employer].

## 2023-04-06 NOTE — Assessment & Plan Note (Signed)
Chronic, stable on low dose levothyroxine - continue this.

## 2023-04-06 NOTE — Assessment & Plan Note (Signed)
Appreciate neuro care on tegretol with last seizure 1991

## 2023-04-06 NOTE — Patient Instructions (Addendum)
Start using sunscreen when outdoors to protect skin from sun damage.  Drop calcium supplement to one a day.  Continue current medicines otherwise.  You are doing well today I will ask care coordinator to contact you about insurance options.  Return as needed or in 1 year for next physical.

## 2023-04-06 NOTE — Assessment & Plan Note (Signed)
Chronic, lipid levels increasing off medication, reviewed diet choices to keep lipid control.  The 10-year ASCVD risk score (Arnett DK, et al., 2019) is: 4.7%   Values used to calculate the score:     Age: 52 years     Sex: Male     Is Non-Hispanic African American: No     Diabetic: No     Tobacco smoker: No     Systolic Blood Pressure: 126 mmHg     Is BP treated: No     HDL Cholesterol: 37.1 mg/dL     Total Cholesterol: 202 mg/dL

## 2023-04-06 NOTE — Progress Notes (Signed)
Ph: [phone] Fax: [phone]   Patient ID: Edward Adkins, male    DOB: 1970-12-20, 52 y.o.   MRN: [id number]  This visit was conducted in person.  BP 126/68   Pulse 80   Temp (!) 97.5 F (36.4 C) (Temporal)   Ht 5' 10.5" (1.791 m)   Wt 186 lb 8 oz (84.6 kg)   SpO2 99%   BMI 26.38 kg/m    CC: CPE - converted to f/u visit as no current health care insurance Subjective:   HPI: Edward Adkins is a 52 y.o. male presenting on 04/06/2023 for Annual Exam   Didn't see health advisor  States he may have lost insurance 2 yrs ago. Previously had medicare. Will refer to care coordinator for assistance.   No results found.  Flowsheet Row Office Visit from 04/05/2022 in [facility] [place]  PHQ-2 Total Score 1          04/06/2023    9:03 AM 04/05/2022    8:13 AM 04/04/2021    8:45 AM 04/01/2020    8:21 AM 03/28/2019   10:31 AM  Fall Risk   Falls in the past year? 0 0 0 0 0  Risk for fall due to :     Medication side effect  Follow up    Falls evaluation completed Falls evaluation completed;Falls prevention discussed   H/o cerebral palsy with mild developmental delay on carbamazepine for h/o seizures followed by [facility] Neuro. Last seizure was 57 Father passed away 07-16-19.    Preventative: COLONOSCOPY 07/31/2022 - diverticulosis, int hem, rpt 7 yrs Transport planner)  Prostate cancer screening - fmhx (father with prostate cancer) - yearly PSA  Lung cancer screening - not indicated  Flu shot yearly  COVID [employer] x2 2021 ([facility] in [place]), no booster  Tdap 03/2020  Pneumonia shot - not due yet Shingles shot - to discuss Advanced directive discussion - has not set up. Would want neighbor Jamal Maes to be HCPOA. does not have living will - packet provided today.  Seat belt use discussed Sunscreen use discussed, no changing moles on skin.  Non smoker  Alcohol - none  Dentist Q6 mo  Eye exam - has not seen. No known fmhx glaucoma.  Bowel - no  constipation Bladder- no incontinence  Patient is single. Father passed away 07-16-19.  Patient drinks two caffeine drinks daily (sodas).  Patient is ambi-dextrous.  Edu: Patient has a high school education.  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  Activity: mows 4 acre yard  Diet: drinking more water/gatorade. fruits/vegetables daily     Relevant past medical, surgical, family and social history reviewed and updated as indicated. Interim medical history since our last visit reviewed. Allergies and medications reviewed and updated. Outpatient Medications Prior to Visit  Medication Sig Dispense Refill   carbamazepine (TEGRETOL) 200 MG tablet Take 1 tablet (200 mg total) by mouth 3 (three) times daily. 270 tablet 3   Multiple Vitamin (MULTIVITAMIN) tablet Take 2 tablets by mouth daily.     Calcium Carb-Cholecalciferol (CALCIUM 600 + D PO) Take 600 mg by mouth in the morning and at bedtime.     levothyroxine (SYNTHROID) 50 MCG tablet Take 1 tablet (50 mcg total) by mouth daily before breakfast. 90 tablet 3   No facility-administered medications prior to visit.     Per HPI unless specifically indicated in ROS section below Review of Systems  Constitutional:  Negative for activity change,  appetite change, chills, fatigue, fever and unexpected weight change.  HENT:  Negative for hearing loss.   Eyes:  Negative for visual disturbance.  Respiratory:  Negative for cough, chest tightness, shortness of breath and wheezing.   Cardiovascular:  Negative for chest pain, palpitations and leg swelling.  Gastrointestinal:  Negative for abdominal distention, abdominal pain, blood in stool, constipation, diarrhea, nausea and vomiting.  Genitourinary:  Negative for difficulty urinating and hematuria.  Musculoskeletal:  Negative for arthralgias, myalgias and neck pain.  Skin:  Negative for rash.  Neurological:  Positive for headaches (heat related). Negative for dizziness, seizures and syncope.   Hematological:  Negative for adenopathy. Does not bruise/bleed easily.  Psychiatric/Behavioral:  Negative for dysphoric mood. The patient is not nervous/anxious.     Objective:  BP 126/68   Pulse 80   Temp (!) 97.5 F (36.4 C) (Temporal)   Ht 5' 10.5" (1.791 m)   Wt 186 lb 8 oz (84.6 kg)   SpO2 99%   BMI 26.38 kg/m   Wt Readings from Last 3 Encounters:  04/06/23 186 lb 8 oz (84.6 kg)  01/18/23 189 lb (85.7 kg)  04/05/22 190 lb 4 oz (86.3 kg)      Physical Exam Vitals and nursing note reviewed.  Constitutional:      General: He is not in acute distress.    Appearance: Normal appearance. He is well-developed. He is not ill-appearing.  HENT:     Head: Normocephalic and atraumatic.     Right Ear: Hearing, tympanic membrane, ear canal and external ear normal.     Left Ear: Hearing, tympanic membrane, ear canal and external ear normal.     Nose: Nose normal.     Mouth/Throat:     Mouth: Mucous membranes are moist.     Pharynx: Oropharynx is clear. No oropharyngeal exudate or posterior oropharyngeal erythema.  Eyes:     General: No scleral icterus.    Extraocular Movements: Extraocular movements intact.     Conjunctiva/sclera: Conjunctivae normal.     Pupils: Pupils are equal, round, and reactive to light.  Neck:     Thyroid: No thyroid mass or thyromegaly.  Cardiovascular:     Rate and Rhythm: Normal rate and regular rhythm.     Pulses: Normal pulses.          Radial pulses are 2+ on the right side and 2+ on the left side.     Heart sounds: Normal heart sounds. No murmur heard. Pulmonary:     Effort: Pulmonary effort is normal. No respiratory distress.     Breath sounds: Normal breath sounds. No wheezing, rhonchi or rales.  Abdominal:     General: Bowel sounds are normal. There is no distension.     Palpations: Abdomen is soft. There is no mass.     Tenderness: There is no abdominal tenderness. There is no guarding or rebound.     Hernia: No hernia is present.   Musculoskeletal:        General: Normal range of motion.     Cervical back: Normal range of motion and neck supple.     Right lower leg: No edema.     Left lower leg: No edema.  Lymphadenopathy:     Cervical: No cervical adenopathy.  Skin:    General: Skin is warm and dry.     Findings: No rash.  Neurological:     General: No focal deficit present.     Mental Status: He is alert and oriented  to person, place, and time.  Psychiatric:        Mood and Affect: Mood normal.        Behavior: Behavior normal.        Thought Content: Thought content normal.        Judgment: Judgment normal.       Results for orders placed or performed in visit on 03/30/23  PSA  Result Value Ref Range   PSA 1.63 0.10 - 4.00 ng/mL  TSH  Result Value Ref Range   TSH 3.38 0.35 - 5.50 uIU/mL  Comprehensive metabolic panel  Result Value Ref Range   Sodium 139 135 - 145 mEq/L   Potassium 4.4 3.5 - 5.1 mEq/L   Chloride 104 96 - 112 mEq/L   CO2 25 19 - 32 mEq/L   Glucose, Bld 84 70 - 99 mg/dL   BUN 21 6 - 23 mg/dL   Creatinine, Ser 1.61 0.40 - 1.50 mg/dL   Total Bilirubin 0.5 0.2 - 1.2 mg/dL   Alkaline Phosphatase 70 39 - 117 U/L   AST 12 0 - 37 U/L   ALT 13 0 - 53 U/L   Total Protein 7.2 6.0 - 8.3 g/dL   Albumin 4.3 3.5 - 5.2 g/dL   GFR 096.04 >54.09 mL/min   Calcium 8.8 8.4 - 10.5 mg/dL  Lipid panel  Result Value Ref Range   Cholesterol 202 (H) 0 - 200 mg/dL   Triglycerides 811.9 0.0 - 149.0 mg/dL   HDL 14.78 (L) >29.56 mg/dL   VLDL 21.3 0.0 - 08.6 mg/dL   LDL Cholesterol 578 (H) 0 - 99 mg/dL   Total CHOL/HDL Ratio 5    NonHDL 165.29    No results found for: "25OHVITD2", "25OHVITD3", "VD25OH"  Assessment & Plan:   Problem List Items Addressed This Visit     Complex partial seizures (HCC)    Appreciate neuro care on tegretol with last seizure 1991      Dyslipidemia    Chronic, lipid levels increasing off medication, reviewed diet choices to keep lipid control.  The 10-year ASCVD  risk score (Arnett DK, et al., 2019) is: 4.7%   Values used to calculate the score:     Age: 28 years     Sex: Male     Is Non-Hispanic African American: No     Diabetic: No     Tobacco smoker: No     Systolic Blood Pressure: 126 mmHg     Is BP treated: No     HDL Cholesterol: 37.1 mg/dL     Total Cholesterol: 202 mg/dL       Cerebral palsy (HCC)    Stable period. Continues working at [employer].       Relevant Orders   AMB Referral to Managed Medicaid Care Management   Mild developmental delay   Relevant Orders   AMB Referral to Managed Medicaid Care Management   Acquired hypothyroidism    Chronic, stable on low dose levothyroxine - continue this.       Relevant Medications   levothyroxine (SYNTHROID) 50 MCG tablet   Does not have health insurance - Primary    Lost insurance. Will ask care coordinator to reach out to patient to help re-enroll.       Relevant Orders   AMB Referral to Managed Medicaid Care Management     Meds ordered this encounter  Medications   levothyroxine (SYNTHROID) 50 MCG tablet    Sig: Take 1 tablet (50 mcg total) by  mouth daily before breakfast.    Dispense:  90 tablet    Refill:  4   Calcium Carb-Cholecalciferol (CALCIUM 600 + D) 600-5 MG-MCG TABS    Sig: Take 1 tablet by mouth daily.    Orders Placed This Encounter  Procedures   AMB Referral to Managed Medicaid Care Management    Referral Priority:   Routine    Referral Type:   Consultation    Referral Reason:   Managed Medicaid Care Management    Number of Visits Requested:   1    Patient Instructions  Start using sunscreen when outdoors to protect skin from sun damage.  Drop calcium supplement to one a day.  Continue current medicines otherwise.  You are doing well today I will ask care coordinator to contact you about insurance options.  Return as needed or in 1 year for next physical.  Follow up plan: Return in about 1 year (around 04/05/2024) for annual exam, prior fasting  for blood work.  Eustaquio Boyden, MD

## 2023-04-25 ENCOUNTER — Telehealth: Payer: Self-pay | Admitting: *Deleted

## 2023-04-25 NOTE — Progress Notes (Signed)
  Managed Medicaid  Care Guide Note  04/25/2023 Name: [name] MRN: [id number] DOB: 1971-01-23  Edward Adkins is a 52 y.o. year old male who is a primary care patient of Eustaquio Boyden, MD.   JENIL GAULDING is enrolled in a Managed Medicaid plan: No. I reached out to Patient                                              by phone today in response to a referral sent by Mr. Field, Haberberger, MD. Outreach attempt today was unsuccesful.       Follow up plan: A third unsuccessful telephone outreach attempt made. A HIPPA compliant phone message was left for the patient providing contact information and requesting a return call.  We have been unable to make contact with the patient for follow up. The care management team is available to follow up with the patient after provider conversation with the patient regarding recommendation for care management engagement and subsequent re-referral to the care management team.   [facility] Coordination Care Guide  Direct Dial: [phone]

## 2023-04-26 ENCOUNTER — Telehealth: Payer: Self-pay | Admitting: *Deleted

## 2023-04-26 NOTE — Progress Notes (Signed)
  Managed Medicaid  Care Guide Note  04/26/2023 Name: Edward Adkins MRN: [id number] DOB: 05/05/1971  Edward Adkins is a 52 y.o. year old male who is a primary care patient of Eustaquio Boyden, MD.   Edward Adkins is enrolled in a Managed Medicaid plan: Yes. I reached out to Patient                                              by phone today in response to a referral sent by Mr. Kaid Spohr Shearon Stalls, MD. Outreach attempt today was successful.     Mr. Cosgrove was given information about care management services as a benefit of their Medicaid health plan.   Patient                                              agreed to services and verbal consent obtained.   Follow up plan: Telephone appointment with care management team member scheduled for:05/09/23  [facility] Coordination Care Guide  Direct Dial: [phone]

## 2023-04-26 NOTE — Progress Notes (Signed)
  Medicaid Managed Care Note  04/26/2023 Name: Edward Adkins MRN: [id number] DOB: July 18, 1971  Edward Adkins is a 52 y.o. year old male who is a primary care patient of Eustaquio Boyden, MD and is actively engaged with the care management team. I reached out to Edward Adkins by phone today to assist with re-scheduling an initial visit with the BSW  Follow up plan: Unsuccessful telephone outreach attempt made. A HIPAA compliant phone message was left for the patient providing contact information and requesting a return call.  We have been unable to make contact with the patient for follow up. The care management team is available to follow up with the patient after provider conversation with the patient regarding recommendation for care management engagement and subsequent re-referral to the care management team.   [facility] Coordination Care Guide  Direct Dial: [phone]

## 2023-05-09 ENCOUNTER — Other Ambulatory Visit: Payer: Self-pay

## 2023-05-09 NOTE — Patient Instructions (Signed)
  Medicaid Managed Care   Unsuccessful Outreach Note  05/09/2023 Name: Edward Adkins MRN: [id number] DOB: 21-Jun-1971  Referred by: Eustaquio Boyden, MD Reason for referral : High Risk Managed Medicaid (MM Social work unsuccessful telephone outreach )   An unsuccessful telephone outreach was attempted today. The patient was referred to the case management team for assistance with care management and care coordination.   Follow Up Plan: The care management team will reach out to the patient again over the next 7-10 days.   Abelino Derrick, MHA [facility]  Managed [facility] Social Worker [phone]

## 2023-05-09 NOTE — Patient Outreach (Signed)
  Medicaid Managed Care   Unsuccessful Outreach Note  05/09/2023 Name: Edward Adkins MRN: [id number] DOB: 21-Jun-1971  Referred by: Eustaquio Boyden, MD Reason for referral : High Risk Managed Medicaid (MM Social work unsuccessful telephone outreach )   An unsuccessful telephone outreach was attempted today. The patient was referred to the case management team for assistance with care management and care coordination.   Follow Up Plan: The care management team will reach out to the patient again over the next 7-10 days.   Abelino Derrick, MHA [facility]  Managed [facility] Social Worker [phone]

## 2023-05-18 ENCOUNTER — Telehealth: Payer: Self-pay

## 2023-05-18 NOTE — Telephone Encounter (Signed)
..  Medicaid Managed Care   Unsuccessful Outreach Note  05/18/2023 Name: Edward Adkins MRN: [id number] DOB: [birth year]  Referred by: Eustaquio Boyden, MD Reason for referral : Appointment (I called the patient today to get his missed phone appointment with the MM BSW rescheduled. He didn't answer and his VM was not set up.)   A second unsuccessful telephone outreach was attempted today. The patient was referred to the case management team for assistance with care management and care coordination.   Follow Up Plan: The care management team will reach out to the patient again over the next 7 days.   Weston Settle Care Guide  [facility] Managed  Care Guide [facility]  [phone]

## 2023-06-01 ENCOUNTER — Other Ambulatory Visit: Payer: Self-pay

## 2023-06-01 NOTE — Patient Instructions (Signed)
  Medicaid Managed Care   Unsuccessful Outreach Note  06/01/2023 Name: Edward Adkins MRN: [id number] DOB: [birth year]  Referred by: Eustaquio Boyden, MD Reason for referral : High Risk Managed Medicaid (MM Social work unsuccessful telephone outreach )   An unsuccessful telephone outreach was attempted today. The patient was referred to the case management team for assistance with care management and care coordination.   Follow Up Plan: The care management team will reach out to the patient again over the next 7-10 days.   Abelino Derrick, MHA [facility]  Managed [facility] Social Worker [phone]

## 2023-06-01 NOTE — Patient Outreach (Signed)
  Medicaid Managed Care   Unsuccessful Outreach Note  06/01/2023 Name: Edward Adkins MRN: [id number] DOB: [birth year]  Referred by: Eustaquio Boyden, MD Reason for referral : High Risk Managed Medicaid (MM Social work unsuccessful telephone outreach )   An unsuccessful telephone outreach was attempted today. The patient was referred to the case management team for assistance with care management and care coordination.   Follow Up Plan: The care management team will reach out to the patient again over the next 7-10 days.   Abelino Derrick, MHA [facility]  Managed [facility] Social Worker [phone]

## 2023-10-18 ENCOUNTER — Other Ambulatory Visit: Payer: Self-pay

## 2023-10-18 NOTE — Telephone Encounter (Signed)
Pt Last Seen 01/18/2023 Upcoming Appointment 01/24/2024  Carbamazepine 200mg  last filled 07/19/2023

## 2023-10-19 MED ORDER — CARBAMAZEPINE 200 MG PO TABS: 200.0000 mg | ORAL_TABLET | Freq: Three times a day (TID) | ORAL | 1 refills | Status: DC

## 2024-01-22 NOTE — Progress Notes (Deleted)
 PATIENT: Edward Adkins DOB: April 10, 1971  REASON FOR VISIT: follow up HISTORY FROM: patient  No chief complaint on file.    HISTORY OF PRESENT ILLNESS:  01/22/24 ALL:  Edward Adkins returns for follow up for seizures. He continues carbamazepine  600mg  daily.  01/18/2023 ALL: Edward Adkins returns for follow up for seizures. He continues carbamazepine  600mg  daily. He is tolerating well. No seizure activity. He continues to work full time. Drives without difficulty. He is followed annually by PCP. He is taking vit D and calcium  supplement.   01/12/2022 ALL: Edward Adkins returns for follow up for seizures. He continues to do well on carbamazepine . He is prescribed 200mg  TID but tells me, today, that he has taken 600mg  once daily for many years. He has tolerated dosing well. No seizure activity since 1991. He continues to work full time at [employer]. He lives alone. He drives without difficulty.   01/12/2021 ALL:  Edward Adkins returns for follow up for seizures. He continues carbamazepine  200mg  TID and is tolerating well. He is followed regularly by PCP. Labs 03/2020 unremarkable. He continues to live alone. He works full time. He is driving without difficulty.   01/08/2020 ALL:  Edward Adkins is a 53 y.o. male here today for follow up for seizures. He is doing very well. He continues carbamazepine  200mg  TID. No seizures. He is tolerating meds well. He works part time at [employer]. He drives without difficulty. He lives alone.   01/08/2019 ALL: Edward Adkins is a 53 y.o. male for follow up of seizure. He reports doing well on carbamazepine  200 mg 3 times daily.  He is tolerating medication well with no obvious adverse effects.  He denies any seizure activity.  Last seizure in September 1991.   01/03/2018 CM: Edward Adkins, 53 year old male returns for follow-up with history of seizure disorder complex partial with secondary generalization.  He is currently on carbamazepine  200 mg 1 tablet 3 times daily.  He denies any  side effects to the medication.  He denies any falls or balance issues. His last seizure was in September 1991.  He continues to work at [employer].  He exercises by playing golf.  He has a history of cerebral palsy mild mental retardation as well he returns for reevaluation.  No new medical issues.  He returns for reevaluation   UPDATE 08/10/16 (VRP): Since last visit, doing well. No sz. Last seizure was Sept 1991. Continues to work at [employer]. He takes CBZ 200mg  TID (5am, 6pm, 11pm).    PRIOR HPI (CM):  Patient has history of cerebral palsy, mild mental retardation and seizure disorder. He has been [facility],  onset of seizure disorder at age 77. 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 He was originally on phenobarbital and developed a rash, switched to Dilantin  with recurrent seizures. Initiated on carbamazepine  in 1991without further seizure activity since that time. Denies any side effects to his medications, any daytime drowsiness, feelings of being off balance, no missed doses of his medication, and no falls.    REVIEW OF SYSTEMS: Out of a complete 14 system review of symptoms, the patient complains only of the following symptoms, seizures and all other reviewed systems are negative.  ALLERGIES: Allergies  Allergen Reactions   Phenobarbital     REACTION: rash    HOME MEDICATIONS: Outpatient Medications Prior to  Visit  Medication Sig Dispense Refill   Calcium  Carb-Cholecalciferol  (CALCIUM  600 + D) 600-5 MG-MCG TABS Take 1 tablet by mouth daily.     carbamazepine  (TEGRETOL ) 200 MG tablet Take 1 tablet (200 mg total) by mouth 3 (three) times daily. 270 tablet 1   levothyroxine  (SYNTHROID ) 50 MCG tablet Take 1 tablet (50 mcg total) by mouth daily before breakfast. 90 tablet 4   Multiple Vitamin (MULTIVITAMIN) tablet Take 2 tablets by mouth daily.     No  facility-administered medications prior to visit.    PAST MEDICAL HISTORY: Past Medical History:  Diagnosis Date   Cerebral palsy (HCC)    Complex partial seizure disorder (HCC) 05/78   Head CT's multiple-WNL, EEG R temporal lobe discharge ([facility]NA)   Diverticulitis 10/2014   complicated by -itis, intra abd abscess, and perforation   Diverticulitis of intestine with perforation 10/14/2014   Diverticulosis 10/2014   complicated by -itis, intra abd abscess, and perforation   Intra-abdominal abscess (HCC)    Mild developmental delay     PAST SURGICAL HISTORY: Past Surgical History:  Procedure Laterality Date   COLONOSCOPY  2016   2 TA, 2 HP, diverticulosis, rpt 5 yrs Earlyne Glory)   COLONOSCOPY  07/31/2022   diverticulosis, int hem, rpt 7 yrs Transport planner)    FAMILY HISTORY: Family History  Problem Relation Age of Onset   Cancer Mother 73       Leukemia   Cancer Father 43       Prostate ca   CAD Father    Diabetes Father    Thyroid  disease Father     SOCIAL HISTORY: Social History   Socioeconomic History   Marital status: Single    Spouse name: Not on file   Number of children: 0   Years of education: 12   Highest education level: Not on file  Occupational History    Employer: [employer]  Tobacco Use   Smoking status: Never   Smokeless tobacco: Never  Vaping Use   Vaping status: Never Used  Substance and Sexual Activity   Alcohol use: No    Alcohol/week: 0.0 standard drinks of alcohol   Drug use: No   Sexual activity: Not Currently  Other Topics Concern   Not on file  Social History Narrative   Patient is single and lives with his father.   Patient drinks two caffeine drinks daily.   Patient is ambi-dextrous.   Patient has a high school education.   Patient is a Merchandiser, retail at [employer].            Social Drivers of Corporate investment banker Strain: Low Risk  (03/28/2019)   Overall Financial Resource Strain (CARDIA)    Difficulty of Paying Living  Expenses: Not hard at all  Food Insecurity: No Food Insecurity (03/28/2019)   Hunger Vital Sign    Worried About Running Out of Food in the Last Year: Never true    Ran Out of Food in the Last Year: Never true  Transportation Needs: No Transportation Needs (03/28/2019)   PRAPARE - Administrator, Civil Service (Medical): No    Lack of Transportation (Non-Medical): No  Physical Activity: Sufficiently Active (03/28/2019)   Exercise Vital Sign    Days of Exercise per Week: 2 days    Minutes of Exercise per Session: 150+ min  Stress: No Stress Concern Present (03/28/2019)   [employer] of Occupational Health - Occupational Stress Questionnaire    Feeling of Stress :  Not at all  Social Connections: Not on file  Intimate Partner Violence: Not At Risk (03/28/2019)   Humiliation, Afraid, Rape, and Kick questionnaire    Fear of Current or Ex-Partner: No    Emotionally Abused: No    Physically Abused: No    Sexually Abused: No      PHYSICAL EXAM  There were no vitals filed for this visit.    There is no height or weight on file to calculate BMI.  Generalized: Well developed, in no acute distress  Cardiology: normal rate and rhythm, no murmur noted Respiratory: clear to auscultation bilaterally  Neurological examination  Mentation: Alert oriented to time, place, history taking. Follows all commands speech and language fluent Cranial nerve II-XII: Pupils were equal round reactive to light. Extraocular movements were full, visual field were full on confrontational test. Facial sensation and strength were normal. Head turning and shoulder shrug  were normal and symmetric. Motor: The motor testing reveals 5 over 5 strength of all 4 extremities. Good symmetric motor tone is noted throughout.  Sensory: Sensory testing is intact to soft touch on all 4 extremities. No evidence of extinction is noted.  Coordination: Cerebellar testing reveals good finger-nose-finger and  heel-to-shin bilaterally.  Gait and station: Gait is normal.   DIAGNOSTIC DATA (LABS, IMAGING, TESTING) - I reviewed patient records, labs, notes, testing and imaging myself where available.     03/28/2019   10:34 AM  MMSE - Mini Mental State Exam  Orientation to time 5  Orientation to Place 5  Registration 3  Attention/ Calculation 5  Recall 3  Language- name 2 objects 0  Language- repeat 1  Language- follow 3 step command 0  Language- read & follow direction 0  Write a sentence 0  Copy design 0  Total score 22     Lab Results  Component Value Date   WBC 8.1 03/29/2022   HGB 14.2 03/29/2022   HCT 42.1 03/29/2022   MCV 91.2 03/29/2022   PLT 186.0 03/29/2022      Component Value Date/Time   NA 139 03/30/2023 0720   NA 141 02/03/2019 1146   K 4.4 03/30/2023 0720   CL 104 03/30/2023 0720   CO2 25 03/30/2023 0720   GLUCOSE 84 03/30/2023 0720   BUN 21 03/30/2023 0720   BUN 12 02/03/2019 1146   CREATININE 0.83 03/30/2023 0720   CALCIUM  8.8 03/30/2023 0720   PROT 7.2 03/30/2023 0720   PROT 7.7 02/03/2019 1146   ALBUMIN 4.3 03/30/2023 0720   ALBUMIN 4.6 02/03/2019 1146   AST 12 03/30/2023 0720   ALT 13 03/30/2023 0720   ALKPHOS 70 03/30/2023 0720   BILITOT 0.5 03/30/2023 0720   BILITOT 0.3 02/03/2019 1146   GFRNONAA 112 02/03/2019 1146   GFRAA 129 02/03/2019 1146   Lab Results  Component Value Date   CHOL 202 (H) 03/30/2023   HDL 37.10 (L) 03/30/2023   LDLCALC 144 (H) 03/30/2023   TRIG 107.0 03/30/2023   CHOLHDL 5 03/30/2023   No results found for: "HGBA1C" No results found for: "VITAMINB12" Lab Results  Component Value Date   TSH 3.38 03/30/2023     ASSESSMENT AND PLAN 53 y.o. year old male  has a past medical history of Cerebral palsy (HCC), Complex partial seizure disorder (HCC) (05/78), Diverticulitis (10/2014), Diverticulitis of intestine with perforation (10/14/2014), Diverticulosis (10/2014), Intra-abdominal abscess (HCC), and Mild developmental  delay. here with   No diagnosis found.   Dennise is doing very well today.  He continues carbamazepine  600mg  daily and is tolerating well.  Last seizure in 1991.  He lives alone, independent in ADLs and working full time at [employer].  He is followed closely by primary care. We will update carbamazepine  level, today.  He was encouraged to stay well-hydrated.  Well-balanced diet and regular exercise advised.  He will return to see me in 1 year, sooner if needed.  He verbalizes understanding and agreement with this plan.   No orders of the defined types were placed in this encounter.    No orders of the defined types were placed in this encounter.      Terrilyn Fick, FNP-C 01/22/2024, 1:08 PM [facility] [address] [place], [place] [address] [phone]

## 2024-01-22 NOTE — Patient Instructions (Incomplete)
 Below is our plan:  We will ***  Please make sure you are consistent with timing of seizure medication. I recommend annual visit with primary care provider (PCP) for complete physical and routine blood work. I recommend daily intake of vitamin D (400-800iu) and calcium (800-1000mg ) for bone health. Discuss Dexa screening with PCP.   According to [place] law, you can not drive unless you are seizure / syncope free for at least 6 months and under physician's care.  Please maintain precautions. Do not participate in activities where a loss of awareness could harm you or someone else. No swimming alone, no tub bathing, no hot tubs, no driving, no operating motorized vehicles (cars, ATVs, motocycles, etc), lawnmowers, power tools or firearms. No standing at heights, such as rooftops, ladders or stairs. Avoid hot objects such as stoves, heaters, open fires. Wear a helmet when riding a bicycle, scooter, skateboard, etc. and avoid areas of traffic. Set your water heater to 120 degrees or less.  SUDEP is the sudden, unexpected death of someone with epilepsy, who was otherwise healthy. In SUDEP cases, no other cause of death is found when an autopsy is done. Each year, more than 1 in 1,000 people with epilepsy die from SUDEP. This is the leading cause of death in people with uncontrolled seizures. Until further answers are available, the best way to prevent SUDEP is to lower your risk by controlling seizures. Research has found that people with all types of epilepsy that experience convulsive seizures can be at risk.  Please make sure you are staying well hydrated. I recommend 50-60 ounces daily. Well balanced diet and regular exercise encouraged. Consistent sleep schedule with 6-8 hours recommended.   Please continue follow up with care team as directed.   Follow up with *** in ***  You may receive a survey regarding today's visit. I encourage you to leave honest feed back as I do use this information to improve  patient care. Thank you for seeing me today!

## 2024-01-24 ENCOUNTER — Ambulatory Visit: Payer: Self-pay | Admitting: Family Medicine

## 2024-01-24 ENCOUNTER — Telehealth: Payer: Self-pay | Admitting: Family Medicine

## 2024-01-24 MED ORDER — CARBAMAZEPINE 200 MG PO TABS: 200.0000 mg | ORAL_TABLET | Freq: Three times a day (TID) | ORAL | 0 refills | Status: DC

## 2024-01-24 NOTE — Telephone Encounter (Signed)
 Last seen on 01/18/23 Follow up scheduled on 03/14/24

## 2024-01-24 NOTE — Telephone Encounter (Signed)
 Pt was scheduled to see NP and to get a refill on his carbamazepine  (TEGRETOL ) 200 MG tablet but NP was out due to being ill. Pt is needing a refill sent in to the [facility] in [place]. Please advise.

## 2024-03-13 NOTE — Patient Instructions (Signed)
 Below is our plan:  We will continue carbamazepine  600mg  daily   Please make sure you are consistent with timing of seizure medication. I recommend annual visit with primary care provider (PCP) for complete physical and routine blood work. I recommend daily intake of vitamin D (400-800iu) and calcium  (800-1000mg ) for bone health. Discuss Dexa screening with PCP.   According to [place] law, you can not drive unless you are seizure / syncope free for at least 6 months and under physician's care.  Please maintain precautions. Do not participate in activities where a loss of awareness could harm you or someone else. No swimming alone, no tub bathing, no hot tubs, no driving, no operating motorized vehicles (cars, ATVs, motocycles, etc), lawnmowers, power tools or firearms. No standing at heights, such as rooftops, ladders or stairs. Avoid hot objects such as stoves, heaters, open fires. Wear a helmet when riding a bicycle, scooter, skateboard, etc. and avoid areas of traffic. Set your water heater to 120 degrees or less.  SUDEP is the sudden, unexpected death of someone with epilepsy, who was otherwise healthy. In SUDEP cases, no other cause of death is found when an autopsy is done. Each year, more than 1 in 1,000 people with epilepsy die from SUDEP. This is the leading cause of death in people with uncontrolled seizures. Until further answers are available, the best way to prevent SUDEP is to lower your risk by controlling seizures. Research has found that people with all types of epilepsy that experience convulsive seizures can be at risk.  Please make sure you are staying well hydrated. I recommend 50-60 ounces daily. Well balanced diet and regular exercise encouraged. Consistent sleep schedule with 6-8 hours recommended.   Please continue follow up with care team as directed.   Follow up with me in 1 year   You may receive a survey regarding today's visit. I encourage you to leave honest feed back as I  do use this information to improve patient care. Thank you for seeing me today!

## 2024-03-13 NOTE — Progress Notes (Signed)
 PATIENT: Edward Adkins DOB: 05-31-1971  REASON FOR VISIT: follow up HISTORY FROM: patient  Chief Complaint  Patient presents with   New Patient (Initial Visit)   Follow-up    Rm 11, follow up sz, no sz, medication compliant, denies SI/HI     HISTORY OF PRESENT ILLNESS:  03/14/24 ALL:  Edward Adkins returns for follow up for seizures. He continues to do well on carbamazepine  600mg  daily. He is tolerating med without obvious adverse effects. No seizures. He lives alone. Drives without difficulty. Working full time.   01/18/2023 ALL:  Edward Adkins returns for follow up for seizures. He continues carbamazepine  600mg  daily. He is tolerating well. No seizure activity. He continues to work full time. Drives without difficulty. He is followed annually by PCP. He is taking vit D and calcium  supplement.   01/12/2022 ALL: Edward Adkins returns for follow up for seizures. He continues to do well on carbamazepine . He is prescribed 200mg  TID but tells me, today, that he has taken 600mg  once daily for many years. He has tolerated dosing well. No seizure activity since 1991. He continues to work full time at [employer]. He lives alone. He drives without difficulty.   01/12/2021 ALL:  Edward Adkins returns for follow up for seizures. He continues carbamazepine  200mg  TID and is tolerating well. He is followed regularly by PCP. Labs 03/2020 unremarkable. He continues to live alone. He works full time. He is driving without difficulty.   01/08/2020 ALL:  Edward Adkins is a 53 y.o. male here today for follow up for seizures. He is doing very well. He continues carbamazepine  200mg  TID. No seizures. He is tolerating meds well. He works part time at [employer]. He drives without difficulty. He lives alone.   01/08/2019 ALL: Edward Adkins is a 53 y.o. male for follow up of seizure. He reports doing well on carbamazepine  200 mg 3 times daily.  He is tolerating medication well with no obvious adverse effects.  He denies any seizure  activity.  Last seizure in September 1991.   01/03/2018 CM: Edward Adkins, 53 year old male returns for follow-up with history of seizure disorder complex partial with secondary generalization.  He is currently on carbamazepine  200 mg 1 tablet 3 times daily.  He denies any side effects to the medication.  He denies any falls or balance issues. His last seizure was in September 1991.  He continues to work at [employer].  He exercises by playing golf.  He has a history of cerebral palsy mild mental retardation as well he returns for reevaluation.  No new medical issues.  He returns for reevaluation   UPDATE 08/10/16 (VRP): Since last visit, doing well. No sz. Last seizure was Sept 1991. Continues to work at [employer]. He takes CBZ 200mg  TID (5am, 6pm, 11pm).    PRIOR HPI (CM):  Patient has history of cerebral palsy, mild mental retardation and seizure disorder. He has been followed at [facility] since [facility],  onset of seizure disorder at age 53. 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 He was originally on phenobarbital and developed a rash, switched to Dilantin  with recurrent seizures. Initiated on carbamazepine  in 1991without further seizure activity since that time. Denies any side effects to his medications, any daytime drowsiness, feelings of being off balance, no missed doses of his medication, and no falls.    REVIEW OF  SYSTEMS: Out of a complete 14 system review of symptoms, the patient complains only of the following symptoms, seizures and all other reviewed systems are negative.  ALLERGIES: Allergies  Allergen Reactions   Phenobarbital     REACTION: rash    HOME MEDICATIONS: Outpatient Medications Prior to Visit  Medication Sig Dispense Refill   Calcium  Carb-Cholecalciferol  (CALCIUM  600 + D) 600-5 MG-MCG TABS Take 1 tablet by mouth daily.     carbamazepine  (TEGRETOL ) 200 MG tablet Take 1 tablet (200  mg total) by mouth 3 (three) times daily. 270 tablet 0   levothyroxine  (SYNTHROID ) 50 MCG tablet Take 1 tablet (50 mcg total) by mouth daily before breakfast. 90 tablet 4   Multiple Vitamin (MULTIVITAMIN) tablet Take 2 tablets by mouth daily.     No facility-administered medications prior to visit.    PAST MEDICAL HISTORY: Past Medical History:  Diagnosis Date   Cerebral palsy (HCC)    Complex partial seizure disorder (HCC) 05/78   Head CT's multiple-WNL, EEG R temporal lobe discharge ([facility])   Diverticulitis 10/2014   complicated by -itis, intra abd abscess, and perforation   Diverticulitis of intestine with perforation 10/14/2014   Diverticulosis 10/2014   complicated by -itis, intra abd abscess, and perforation   Intra-abdominal abscess (HCC)    Mild developmental delay     PAST SURGICAL HISTORY: Past Surgical History:  Procedure Laterality Date   COLONOSCOPY  2016   2 TA, 2 HP, diverticulosis, rpt 5 yrs Isabelle Ee)   COLONOSCOPY  07/31/2022   diverticulosis, int hem, rpt 7 yrs Transport planner)    FAMILY HISTORY: Family History  Problem Relation Age of Onset   Cancer Mother 31       Leukemia   Cancer Father 37       Prostate ca   CAD Father    Diabetes Father    Thyroid  disease Father     SOCIAL HISTORY: Social History   Socioeconomic History   Marital status: Single    Spouse name: Not on file   Number of children: 0   Years of education: 12   Highest education level: Not on file  Occupational History    Employer: [employer]  Tobacco Use   Smoking status: Never   Smokeless tobacco: Never  Vaping Use   Vaping status: Never Used  Substance and Sexual Activity   Alcohol use: No    Alcohol/week: 0.0 standard drinks of alcohol   Drug use: No   Sexual activity: Not Currently  Other Topics Concern   Not on file  Social History Narrative   Patient is single and lives with his father.   Patient drinks two caffeine drinks daily.   Patient is  ambi-dextrous.   Patient has a high school education.   Patient is a Merchandiser, retail at [employer].            Social Drivers of Corporate investment banker Strain: Low Risk  (03/28/2019)   Overall Financial Resource Strain (CARDIA)    Difficulty of Paying Living Expenses: Not hard at all  Food Insecurity: No Food Insecurity (03/28/2019)   Hunger Vital Sign    Worried About Running Out of Food in the Last Year: Never true    Ran Out of Food in the Last Year: Never true  Transportation Needs: No Transportation Needs (03/28/2019)   PRAPARE - Administrator, Civil Service (Medical): No    Lack of Transportation (Non-Medical): No  Physical Activity: Sufficiently Active (03/28/2019)  Exercise Vital Sign    Days of Exercise per Week: 2 days    Minutes of Exercise per Session: 150+ min  Stress: No Stress Concern Present (03/28/2019)   [employer] of Occupational Health - Occupational Stress Questionnaire    Feeling of Stress : Not at all  Social Connections: Not on file  Intimate Partner Violence: Not At Risk (03/28/2019)   Humiliation, Afraid, Rape, and Kick questionnaire    Fear of Current or Ex-Partner: No    Emotionally Abused: No    Physically Abused: No    Sexually Abused: No      PHYSICAL EXAM  Vitals:   03/14/24 0851  BP: 128/74  Weight: 197 lb (89.4 kg)  Height: 6' (1.829 m)      Body mass index is 26.72 kg/m.  Generalized: Well developed, in no acute distress  Cardiology: normal rate and rhythm, no murmur noted Respiratory: clear to auscultation bilaterally  Neurological examination  Mentation: Alert oriented to time, place, history taking. Follows all commands speech and language fluent Cranial nerve II-XII: Pupils were equal round reactive to light. Extraocular movements were full, visual field were full on confrontational test. Facial sensation and strength were normal. Head turning and shoulder shrug  were normal and symmetric. Motor: The motor  testing reveals 5 over 5 strength of all 4 extremities. Good symmetric motor tone is noted throughout.  Sensory: Sensory testing is intact to soft touch on all 4 extremities. No evidence of extinction is noted.  Coordination: Cerebellar testing reveals good finger-nose-finger and heel-to-shin bilaterally.  Gait and station: Gait is normal.   DIAGNOSTIC DATA (LABS, IMAGING, TESTING) - I reviewed patient records, labs, notes, testing and imaging myself where available.     03/28/2019   10:34 AM  MMSE - Mini Mental State Exam  Orientation to time 5  Orientation to Place 5  Registration 3  Attention/ Calculation 5  Recall 3  Language- name 2 objects 0  Language- repeat 1  Language- follow 3 step command 0  Language- read & follow direction 0  Write a sentence 0  Copy design 0  Total score 22     Lab Results  Component Value Date   WBC 8.1 03/29/2022   HGB 14.2 03/29/2022   HCT 42.1 03/29/2022   MCV 91.2 03/29/2022   PLT 186.0 03/29/2022      Component Value Date/Time   NA 139 03/30/2023 0720   NA 141 02/03/2019 1146   K 4.4 03/30/2023 0720   CL 104 03/30/2023 0720   CO2 25 03/30/2023 0720   GLUCOSE 84 03/30/2023 0720   BUN 21 03/30/2023 0720   BUN 12 02/03/2019 1146   CREATININE 0.83 03/30/2023 0720   CALCIUM  8.8 03/30/2023 0720   PROT 7.2 03/30/2023 0720   PROT 7.7 02/03/2019 1146   ALBUMIN 4.3 03/30/2023 0720   ALBUMIN 4.6 02/03/2019 1146   AST 12 03/30/2023 0720   ALT 13 03/30/2023 0720   ALKPHOS 70 03/30/2023 0720   BILITOT 0.5 03/30/2023 0720   BILITOT 0.3 02/03/2019 1146   GFRNONAA 112 02/03/2019 1146   GFRAA 129 02/03/2019 1146   Lab Results  Component Value Date   CHOL 202 (H) 03/30/2023   HDL 37.10 (L) 03/30/2023   LDLCALC 144 (H) 03/30/2023   TRIG 107.0 03/30/2023   CHOLHDL 5 03/30/2023   No results found for: HGBA1C No results found for: VITAMINB12 Lab Results  Component Value Date   TSH 3.38 03/30/2023     ASSESSMENT AND  PLAN 53  y.o. year old male  has a past medical history of Cerebral palsy (HCC), Complex partial seizure disorder (HCC) (05/78), Diverticulitis (10/2014), Diverticulitis of intestine with perforation (10/14/2014), Diverticulosis (10/2014), Intra-abdominal abscess (HCC), and Mild developmental delay. here with     ICD-10-CM   1. Partial symptomatic epilepsy with complex partial seizures, intractable, without status epilepticus (HCC)  G40.219 Carbamazepine  level, total      Edward Adkins is doing very well today.  He continues carbamazepine  600mg  daily and is tolerating well.  Last seizure in 1991.  He lives alone, independent in ADLs and working full time at [employer].  He is followed closely by primary care. We will update carbamazepine  level, today.  He was encouraged to stay well-hydrated.  Well-balanced diet and regular exercise advised.  He will return to see me in 1 year, sooner if needed.  He verbalizes understanding and agreement with this plan.   Orders Placed This Encounter  Procedures   Carbamazepine  level, total     No orders of the defined types were placed in this encounter.   I personally spent a total of 30 minutes in the care of the patient today including preparing to see the patient, getting/reviewing separately obtained history, performing a medically appropriate exam/evaluation, counseling and educating, placing orders, and documenting clinical information in the EHR.    Greig Forbes, FNP-C 03/14/2024, 9:06 AM [facility] [address] [place], [place] [address] [phone]

## 2024-03-14 ENCOUNTER — Encounter: Payer: Self-pay | Admitting: Family Medicine

## 2024-03-14 ENCOUNTER — Ambulatory Visit (INDEPENDENT_AMBULATORY_CARE_PROVIDER_SITE_OTHER): Payer: Self-pay | Admitting: Family Medicine

## 2024-03-14 VITALS — BP 128/74 | Ht 72.0 in | Wt 197.0 lb

## 2024-03-14 DIAGNOSIS — G40219 Localization-related (focal) (partial) symptomatic epilepsy and epileptic syndromes with complex partial seizures, intractable, without status epilepticus: Secondary | ICD-10-CM

## 2024-03-15 LAB — CARBAMAZEPINE LEVEL, TOTAL: Carbamazepine (Tegretol), S: 9.7 ug/mL (ref 4.0–12.0)

## 2024-03-17 ENCOUNTER — Ambulatory Visit: Payer: Self-pay | Admitting: Family Medicine

## 2024-03-17 NOTE — Telephone Encounter (Addendum)
 Unable to leave VM due to VM box not being set up yet.  ----- Message from Amy Lomax sent at 03/17/2024  8:17 AM EDT ----- His carbamazepine  level is normal. TY! ----- Message ----- From: Interface, [facility] Lab Results In Sent: 03/15/2024   9:36 AM EDT To: Greig Forbes, NP

## 2024-03-25 NOTE — Telephone Encounter (Signed)
 Called pt at [phone]. LVM for pt to call

## 2024-03-25 NOTE — Telephone Encounter (Signed)
 Pt returned call. Per RN Maurilio PARAS. it was ok to relay to the pt that his carbamazepine  level was normal.

## 2024-03-29 ENCOUNTER — Other Ambulatory Visit: Payer: Self-pay | Admitting: Family Medicine

## 2024-03-29 DIAGNOSIS — Z125 Encounter for screening for malignant neoplasm of prostate: Secondary | ICD-10-CM

## 2024-03-29 DIAGNOSIS — E039 Hypothyroidism, unspecified: Secondary | ICD-10-CM

## 2024-03-29 DIAGNOSIS — E785 Hyperlipidemia, unspecified: Secondary | ICD-10-CM

## 2024-03-31 ENCOUNTER — Other Ambulatory Visit (INDEPENDENT_AMBULATORY_CARE_PROVIDER_SITE_OTHER): Payer: Self-pay

## 2024-03-31 DIAGNOSIS — E039 Hypothyroidism, unspecified: Secondary | ICD-10-CM

## 2024-03-31 DIAGNOSIS — E785 Hyperlipidemia, unspecified: Secondary | ICD-10-CM

## 2024-03-31 DIAGNOSIS — Z125 Encounter for screening for malignant neoplasm of prostate: Secondary | ICD-10-CM

## 2024-03-31 LAB — COMPREHENSIVE METABOLIC PANEL WITH GFR
ALT: 12 U/L (ref 0–53)
AST: 11 U/L (ref 0–37)
Albumin: 4.5 g/dL (ref 3.5–5.2)
Alkaline Phosphatase: 67 U/L (ref 39–117)
BUN: 21 mg/dL (ref 6–23)
CO2: 26 meq/L (ref 19–32)
Calcium: 9.6 mg/dL (ref 8.4–10.5)
Chloride: 105 meq/L (ref 96–112)
Creatinine, Ser: 0.8 mg/dL (ref 0.40–1.50)
GFR: 102.51 mL/min (ref >60.00)
Glucose, Bld: 91 mg/dL (ref 70–99)
Potassium: 4.4 meq/L (ref 3.5–5.1)
Sodium: 140 meq/L (ref 135–145)
Total Bilirubin: 0.4 mg/dL (ref 0.2–1.2)
Total Protein: 7.5 g/dL (ref 6.0–8.3)

## 2024-03-31 LAB — LIPID PANEL
Cholesterol: 218 mg/dL — ABNORMAL HIGH (ref 0–200)
HDL: 44.5 mg/dL (ref >39.00)
LDL Cholesterol: 153 mg/dL — ABNORMAL HIGH (ref 0–99)
NonHDL: 173.63
Total CHOL/HDL Ratio: 5
Triglycerides: 103 mg/dL (ref 0.0–149.0)
VLDL: 20.6 mg/dL (ref 0.0–40.0)

## 2024-04-01 LAB — PSA: PSA: 0.81 ng/mL (ref 0.10–4.00)

## 2024-04-01 LAB — TSH: TSH: 4.05 u[IU]/mL (ref 0.35–5.50)

## 2024-04-02 ENCOUNTER — Ambulatory Visit: Payer: Self-pay | Admitting: Family Medicine

## 2024-04-07 ENCOUNTER — Encounter: Payer: Self-pay | Admitting: Family Medicine

## 2024-04-07 ENCOUNTER — Ambulatory Visit (INDEPENDENT_AMBULATORY_CARE_PROVIDER_SITE_OTHER): Payer: Self-pay | Admitting: Family Medicine

## 2024-04-07 VITALS — BP 120/84 | HR 77 | Temp 98.2°F | Ht 72.0 in | Wt 199.0 lb

## 2024-04-07 DIAGNOSIS — E039 Hypothyroidism, unspecified: Secondary | ICD-10-CM

## 2024-04-07 DIAGNOSIS — Z23 Encounter for immunization: Secondary | ICD-10-CM

## 2024-04-07 DIAGNOSIS — Z0000 Encounter for general adult medical examination without abnormal findings: Secondary | ICD-10-CM | POA: Insufficient documentation

## 2024-04-07 DIAGNOSIS — G809 Cerebral palsy, unspecified: Secondary | ICD-10-CM

## 2024-04-07 DIAGNOSIS — G40209 Localization-related (focal) (partial) symptomatic epilepsy and epileptic syndromes with complex partial seizures, not intractable, without status epilepticus: Secondary | ICD-10-CM

## 2024-04-07 DIAGNOSIS — Z5971 Insufficient health insurance coverage: Secondary | ICD-10-CM

## 2024-04-07 DIAGNOSIS — E785 Hyperlipidemia, unspecified: Secondary | ICD-10-CM

## 2024-04-07 DIAGNOSIS — R6250 Unspecified lack of expected normal physiological development in childhood: Secondary | ICD-10-CM

## 2024-04-07 MED ORDER — VITAMIN D3 25 MCG (1000 UT) PO CAPS: 1.0000 | ORAL_CAPSULE | Freq: Every day | ORAL | Status: AC

## 2024-04-07 MED ORDER — LEVOTHYROXINE SODIUM 50 MCG PO TABS: 50.0000 ug | ORAL_TABLET | Freq: Every day | ORAL | 3 refills | Status: AC

## 2024-04-07 NOTE — Assessment & Plan Note (Signed)
 Preventative protocols reviewed and updated unless pt declined. Discussed healthy diet and lifestyle.

## 2024-04-07 NOTE — Patient Instructions (Addendum)
 First shingles shot today. Then schedule nurse visit in 2-6 months for second and final shingles shot. Return in 1 year for physical Bring us  a copy of your living will to update chart. I will ask care coordinator to reach out to assist with insurance  Try to get most calcium  in diet - milk, dairy products, leafy green vegetables.  Stop calcium  pills, instead take only vitamin D3 1000 units daily.

## 2024-04-07 NOTE — Assessment & Plan Note (Signed)
 New referral placed to [facility] to assist in setting up insurance - he states he has disability so may qualify for medicaid.

## 2024-04-07 NOTE — Assessment & Plan Note (Signed)
 Chronic, reviewed diet choices to improve cholesterol control. The 10-year ASCVD risk score (Arnett DK, et al., 2019) is: 4.3%   Values used to calculate the score:     Age: 53 years     Clincally relevant sex: Male     Is Non-Hispanic African American: No     Diabetic: No     Tobacco smoker: No     Systolic Blood Pressure: 120 mmHg     Is BP treated: No     HDL Cholesterol: 44.5 mg/dL     Total Cholesterol: 218 mg/dL

## 2024-04-07 NOTE — Assessment & Plan Note (Signed)
Chronic, continue levothyroxine 50 mcg daily.

## 2024-04-07 NOTE — Progress Notes (Signed)
 Ph: [phone] Fax: [phone]   Patient ID: Edward Adkins, male    DOB: 06-30-1971, 53 y.o.   MRN: [id number]  This visit was conducted in person.  BP 120/84   Pulse 77   Temp 98.2 F (36.8 C) (Oral)   Ht 6' (1.829 m)   Wt 199 lb (90.3 kg)   SpO2 100%   BMI 26.99 kg/m    CC: CPE --> converted to acute visit as on insurance Subjective:   HPI: Edward Adkins is a 54 y.o. male presenting on 04/07/2024 for Annual Exam   No insurance - previously had medicare/medicaid.  Was unable to get connected last year with care coordinators.  Best # for patient [phone] (cell).  Uncle Edward Adkins in [place] helps out.  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   Did not see health advisor this year.   No results found.  Flowsheet Row Office Visit from 04/07/2024 in [facility] [place]  PHQ-2 Total Score 3       04/07/2024   10:07 AM 04/06/2023    9:03 AM 04/05/2022    8:13 AM 04/04/2021    8:45 AM 04/01/2020    8:21 AM  Fall Risk   Falls in the past year? 0 0 0 0 0  Number falls in past yr: 0      Injury with Fall? 0      Follow up Falls evaluation completed    Falls evaluation completed      Data saved with a previous flowsheet row definition   H/o cerebral palsy with mild developmental delay on carbamazepine  200mg  TID for h/o partial seizure followed by [facility]. Last seizure was 65 Father passed away 06/19/2019.   Notes some weight gain.   Preventative: COLONOSCOPY 07/31/2022 - diverticulosis, int hem, rpt 7 yrs Transport planner)  Prostate cancer screening - fmhx (father with prostate cancer) - yearly PSA  Lung cancer screening - not indicated  Flu shot yearly  COVID [employer] x2 2021 ([facility] in [place]), no booster  Tdap 03/2020  Pneumonia shot - not due yet Shingles shot - discussed.  Advanced directive discussion - has not set up. Would want neighbor Edward Adkins to be HCPOA. does have living will at home - packet previously provided.   Seat belt use discussed Sunscreen use discussed, no changing moles on skin.  Non smoker  Alcohol - none  Dentist Q6 mo  Eye exam - has not seen. No known fmhx glaucoma.  Bowel - no constipation Bladder- no incontinence  Patient is single. Father passed away 06-19-19.  Patient drinks two caffeine drinks daily (sodas).  Patient is ambi-dextrous.  Edu: Patient has a high school education.  Occ: Patient is a part time Merchandiser, retail at [employer].  Activity: mows 4 acre yard  Diet: drinking more water/gatorade. fruits/vegetables daily     Relevant past medical, surgical, family and social history reviewed and updated as indicated. Interim medical history since our last visit reviewed. Allergies and medications reviewed and updated. Outpatient Medications Prior to Visit  Medication Sig Dispense Refill   carbamazepine  (TEGRETOL ) 200 MG tablet Take 1 tablet (200 mg total) by mouth 3 (three) times daily. 270 tablet 0   Multiple Vitamin (MULTIVITAMIN) tablet Take 2 tablets by mouth daily.     Calcium  Carb-Cholecalciferol  (CALCIUM  600 + D) 600-5 MG-MCG TABS Take 1 tablet by mouth daily.     levothyroxine  (SYNTHROID ) 50 MCG tablet Take 1 tablet (50  mcg total) by mouth daily before breakfast. 90 tablet 4   No facility-administered medications prior to visit.     Per HPI unless specifically indicated in ROS section below Review of Systems  Constitutional:  Negative for activity change, appetite change, chills, fatigue, fever and unexpected weight change.  HENT:  Negative for hearing loss.   Eyes:  Negative for visual disturbance.  Respiratory:  Positive for chest tightness (occ - dull, not exertional). Negative for cough, shortness of breath and wheezing.   Cardiovascular:  Negative for chest pain, palpitations and leg swelling.  Gastrointestinal:  Negative for abdominal distention, abdominal pain, blood in stool, constipation, diarrhea, nausea and vomiting.  Genitourinary:  Negative for difficulty  urinating and hematuria.  Musculoskeletal:  Negative for arthralgias, myalgias and neck pain.  Skin:  Negative for rash.  Neurological:  Negative for dizziness, seizures, syncope and headaches.  Hematological:  Negative for adenopathy. Does not bruise/bleed easily.  Psychiatric/Behavioral:  Negative for dysphoric mood. The patient is not nervous/anxious.     Objective:  BP 120/84   Pulse 77   Temp 98.2 F (36.8 C) (Oral)   Ht 6' (1.829 m)   Wt 199 lb (90.3 kg)   SpO2 100%   BMI 26.99 kg/m   Wt Readings from Last 3 Encounters:  04/07/24 199 lb (90.3 kg)  03/14/24 197 lb (89.4 kg)  04/06/23 186 lb 8 oz (84.6 kg)      Physical Exam Vitals and nursing note reviewed.  Constitutional:      General: He is not in acute distress.    Appearance: Normal appearance. He is well-developed. He is not ill-appearing.  HENT:     Head: Normocephalic and atraumatic.     Right Ear: Hearing, tympanic membrane, ear canal and external ear normal.     Left Ear: Hearing, ear canal and external ear normal. There is impacted cerumen.     Mouth/Throat:     Mouth: Mucous membranes are moist.     Pharynx: Oropharynx is clear. No oropharyngeal exudate or posterior oropharyngeal erythema.  Eyes:     General: No scleral icterus.    Extraocular Movements: Extraocular movements intact.     Conjunctiva/sclera: Conjunctivae normal.     Pupils: Pupils are equal, round, and reactive to light.  Neck:     Thyroid : No thyroid  mass or thyromegaly.     Vascular: No carotid bruit.  Cardiovascular:     Rate and Rhythm: Normal rate and regular rhythm.     Pulses: Normal pulses.          Radial pulses are 2+ on the right side and 2+ on the left side.     Heart sounds: Normal heart sounds. No murmur heard. Pulmonary:     Effort: Pulmonary effort is normal. No respiratory distress.     Breath sounds: Normal breath sounds. No wheezing, rhonchi or rales.  Abdominal:     General: Bowel sounds are normal. There is no  distension.     Palpations: Abdomen is soft. There is no mass.     Tenderness: There is no abdominal tenderness. There is no guarding or rebound.     Hernia: No hernia is present.  Musculoskeletal:        General: Normal range of motion.     Cervical back: Normal range of motion and neck supple.     Right lower leg: No edema.     Left lower leg: No edema.  Lymphadenopathy:     Cervical: No cervical  adenopathy.  Skin:    General: Skin is warm and dry.     Findings: No rash.  Neurological:     General: No focal deficit present.     Mental Status: He is alert and oriented to person, place, and time.  Psychiatric:        Mood and Affect: Mood normal.        Behavior: Behavior normal.        Thought Content: Thought content normal.        Judgment: Judgment normal.       Results for orders placed or performed in visit on 03/31/24  PSA   Collection Time: 03/31/24  7:28 AM  Result Value Ref Range   PSA 0.81 0.10 - 4.00 ng/mL  TSH   Collection Time: 03/31/24  7:28 AM  Result Value Ref Range   TSH 4.05 0.35 - 5.50 uIU/mL  Comprehensive metabolic panel with GFR   Collection Time: 03/31/24  7:28 AM  Result Value Ref Range   Sodium 140 135 - 145 mEq/L   Potassium 4.4 3.5 - 5.1 mEq/L   Chloride 105 96 - 112 mEq/L   CO2 26 19 - 32 mEq/L   Glucose, Bld 91 70 - 99 mg/dL   BUN 21 6 - 23 mg/dL   Creatinine, Ser 9.19 0.40 - 1.50 mg/dL   Total Bilirubin 0.4 0.2 - 1.2 mg/dL   Alkaline Phosphatase 67 39 - 117 U/L   AST 11 0 - 37 U/L   ALT 12 0 - 53 U/L   Total Protein 7.5 6.0 - 8.3 g/dL   Albumin 4.5 3.5 - 5.2 g/dL   GFR 897.48 >39.99 mL/min   Calcium  9.6 8.4 - 10.5 mg/dL  Lipid panel   Collection Time: 03/31/24  7:28 AM  Result Value Ref Range   Cholesterol 218 (H) 0 - 200 mg/dL   Triglycerides 896.9 0.0 - 149.0 mg/dL   HDL 55.49 >60.99 mg/dL   VLDL 79.3 0.0 - 59.9 mg/dL   LDL Cholesterol 846 (H) 0 - 99 mg/dL   Total CHOL/HDL Ratio 5    NonHDL 173.63       04/07/2024   10:07  AM 04/05/2022    8:13 AM 04/04/2021    8:45 AM 04/01/2020    8:21 AM 03/28/2019   10:31 AM  Depression screen PHQ 2/9  Decreased Interest 0 0 0 0 0  Down, Depressed, Hopeless 3 1 0 0 0  PHQ - 2 Score 3 1 0 0 0  Altered sleeping 0    0  Tired, decreased energy 0    0  Change in appetite 0    0  Feeling bad or failure about yourself  0    0  Trouble concentrating 0    0  Moving slowly or fidgety/restless 0    0  Suicidal thoughts 1    0  PHQ-9 Score 4    0  Difficult doing work/chores Not difficult at all           04/07/2024   10:07 AM  GAD 7 : Generalized Anxiety Score  Nervous, Anxious, on Edge 0  Control/stop worrying 0  Worry too much - different things 1  Trouble relaxing 0  Restless 0  Easily annoyed or irritable 0  Afraid - awful might happen 0  Total GAD 7 Score 1  Anxiety Difficulty Not difficult at all     SDOH Screenings   Food Insecurity: No Food Insecurity (03/28/2019)  Transportation Needs: No Transportation Needs (03/28/2019)  Depression (PHQ2-9): Low Risk  (04/07/2024)  Financial Resource Strain: Low Risk  (03/28/2019)  Physical Activity: Sufficiently Active (03/28/2019)  Stress: No Stress Concern Present (03/28/2019)  Tobacco Use: Low Risk  (04/07/2024)   Assessment & Plan:   Problem List Items Addressed This Visit     Health maintenance examination (Chronic)   Preventative protocols reviewed and updated unless pt declined. Discussed healthy diet and lifestyle.       Complex partial seizures (HCC)   Appreciate [facility] care - continue carbamazepine  200mg  TID without recent seizure.       Relevant Orders   AMB Referral [facility] Care Management   Dyslipidemia   Chronic, reviewed diet choices to improve cholesterol control. The 10-year ASCVD risk score (Arnett DK, et al., 2019) is: 4.3%   Values used to calculate the score:     Age: 63 years     Clincally relevant sex: Male     Is Non-Hispanic African American: No     Diabetic: No     Tobacco smoker: No      Systolic Blood Pressure: 120 mmHg     Is BP treated: No     HDL Cholesterol: 44.5 mg/dL     Total Cholesterol: 218 mg/dL       Cerebral palsy (HCC)   Relevant Orders   AMB Referral [facility] Care Management   Mild developmental delay   Relevant Orders   AMB Referral [facility] Care Management   Acquired hypothyroidism - Primary   Chronic, continue levothyroxine  50mcg daily.       Relevant Medications   levothyroxine  (SYNTHROID ) 50 MCG tablet   Does not have health insurance   New referral placed to [facility] to assist in setting up insurance - he states he has disability so may qualify for medicaid.       Relevant Orders   AMB Referral [facility] Care Management   Other Visit Diagnoses       Need for shingles vaccine       Relevant Orders   Varicella-zoster vaccine IM (Completed)        Meds ordered this encounter  Medications   levothyroxine  (SYNTHROID ) 50 MCG tablet    Sig: Take 1 tablet (50 mcg total) by mouth daily before breakfast.    Dispense:  90 tablet    Refill:  3   Cholecalciferol  (VITAMIN D3) 25 MCG (1000 UT) CAPS    Sig: Take 1 capsule (1,000 Units total) by mouth daily.    Orders Placed This Encounter  Procedures   Varicella-zoster vaccine IM   AMB Referral [facility] Care Management    Referral Priority:   Routine    Referral Type:   Consultation    Referral Reason:   Care Coordination    Number of Visits Requested:   1    Patient Instructions  First shingles shot today. Then schedule nurse visit in 2-6 months for second and final shingles shot. Return in 1 year for physical Bring us  a copy of your living will to update chart. I will ask care coordinator to reach out to assist with insurance  Try to get most calcium  in diet - milk, dairy products, leafy green vegetables.  Stop calcium  pills, instead take only vitamin D3 1000 units daily.   Follow up plan: Return in about 1 year (around 04/07/2025) for annual exam, prior fasting for blood work.  Anton Blas, MD

## 2024-04-07 NOTE — Assessment & Plan Note (Addendum)
 Appreciate [facility] care - continue carbamazepine  200mg  TID without recent seizure.

## 2024-04-11 ENCOUNTER — Telehealth: Payer: Self-pay

## 2024-04-11 NOTE — Progress Notes (Signed)
 Complex Care Management Note Care Guide Note  04/11/2024 Name: Edward Adkins MRN: [id number] DOB: December 14, 1970   Complex Care Management Outreach Attempts: An unsuccessful telephone outreach was attempted today to offer the patient information about available complex care management services.  Follow Up Plan:  Additional outreach attempts will be made to offer the patient complex care management information and services.   Encounter Outcome:  No Answer  Leotis Rase [facility], [facility] Guide  Direct Dial: [phone]  Fax [phone]

## 2024-04-14 NOTE — Progress Notes (Signed)
 Complex Care Management Note Care Guide Note  04/14/2024 Name: Edward Adkins MRN: [id number] DOB: [birth year]   Complex Care Management Outreach Attempts: A third unsuccessful outreach was attempted today to offer the patient with information about available complex care management services.  Follow Up Plan:  No further outreach attempts will be made at this time. We have been unable to contact the patient to offer or enroll patient in complex care management services.  Encounter Outcome:  No Answer  Leotis Rase [facility], [facility] Guide  Direct Dial: [phone]  Fax [phone]

## 2024-04-14 NOTE — Progress Notes (Signed)
 Complex Care Management Note Care Guide Note  04/14/2024 Name: Edward Adkins MRN: [id number] DOB: 1971/02/06   Complex Care Management Outreach Attempts: A second unsuccessful outreach was attempted today to offer the patient with information about available complex care management services.  Follow Up Plan:  Additional outreach attempts will be made to offer the patient complex care management information and services.   Encounter Outcome:  No Answer  Leotis Rase [facility], [facility] Guide  Direct Dial: [phone]  Fax [phone]

## 2024-08-12 ENCOUNTER — Ambulatory Visit: Payer: Self-pay

## 2024-08-19 ENCOUNTER — Ambulatory Visit: Payer: Self-pay

## 2024-08-19 DIAGNOSIS — Z23 Encounter for immunization: Secondary | ICD-10-CM

## 2024-08-19 NOTE — Progress Notes (Signed)
Per orders of Dr. Javier Gutierrez, injection of shingrix given by Raechell Singleton in right deltoid. Patient tolerated injection well. .   

## 2024-09-15 ENCOUNTER — Telehealth: Payer: Self-pay | Admitting: Family Medicine

## 2024-09-15 ENCOUNTER — Other Ambulatory Visit: Payer: Self-pay

## 2024-09-15 MED ORDER — CARBAMAZEPINE 200 MG PO TABS: 200.0000 mg | ORAL_TABLET | Freq: Three times a day (TID) | ORAL | 0 refills | Status: AC

## 2024-09-15 NOTE — Telephone Encounter (Signed)
 Called pt and let him know his script was sent to his pharmacy

## 2024-09-15 NOTE — Telephone Encounter (Signed)
 Script sent to pharmacy.

## 2024-09-15 NOTE — Telephone Encounter (Signed)
 Pt called to request refill on medication  carbamazepine  (TEGRETOL ) 200 MG tablet  Pt medication is to sent  to   [id number] [place], [place] - [address] AT [facility] [address] (Ph: [phone])

## 2025-03-17 ENCOUNTER — Ambulatory Visit: Payer: Self-pay | Admitting: Family Medicine

## 2025-04-03 ENCOUNTER — Other Ambulatory Visit: Payer: Self-pay

## 2025-04-10 ENCOUNTER — Encounter: Payer: Self-pay | Admitting: Family Medicine
